# Patient Record
Sex: Male | Born: 1990 | Hispanic: Yes | Marital: Single | Smoking: Former smoker
Health system: Southern US, Community
[De-identification: ages and names within clinical notes are randomized; demographics above are authoritative.]

## PROBLEM LIST (undated history)

## (undated) DIAGNOSIS — N186 End stage renal disease: Secondary | ICD-10-CM

## (undated) DIAGNOSIS — E1159 Type 2 diabetes mellitus with other circulatory complications: Secondary | ICD-10-CM

## (undated) DIAGNOSIS — F1290 Cannabis use, unspecified, uncomplicated: Secondary | ICD-10-CM

## (undated) DIAGNOSIS — E119 Type 2 diabetes mellitus without complications: Secondary | ICD-10-CM

## (undated) DIAGNOSIS — D631 Anemia in chronic kidney disease: Secondary | ICD-10-CM

## (undated) DIAGNOSIS — F4323 Adjustment disorder with mixed anxiety and depressed mood: Secondary | ICD-10-CM

## (undated) DIAGNOSIS — Z992 Dependence on renal dialysis: Secondary | ICD-10-CM

## (undated) DIAGNOSIS — Z794 Long term (current) use of insulin: Secondary | ICD-10-CM

## (undated) DIAGNOSIS — I152 Hypertension secondary to endocrine disorders: Secondary | ICD-10-CM

## (undated) DIAGNOSIS — E785 Hyperlipidemia, unspecified: Secondary | ICD-10-CM

## (undated) DIAGNOSIS — J45909 Unspecified asthma, uncomplicated: Secondary | ICD-10-CM

## (undated) DIAGNOSIS — E1110 Type 2 diabetes mellitus with ketoacidosis without coma: Secondary | ICD-10-CM

## (undated) DIAGNOSIS — N189 Chronic kidney disease, unspecified: Secondary | ICD-10-CM

## (undated) DIAGNOSIS — G4733 Obstructive sleep apnea (adult) (pediatric): Secondary | ICD-10-CM

## (undated) HISTORY — PX: TONSILLECTOMY: SUR1361

## (undated) HISTORY — PX: EYE SURGERY: SHX253

---
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
## 2018-02-12 ENCOUNTER — Ambulatory Visit: Payer: Self-pay

## 2018-02-19 ENCOUNTER — Other Ambulatory Visit: Payer: Self-pay

## 2018-02-19 ENCOUNTER — Emergency Department
Admission: EM | Admit: 2018-02-19 | Discharge: 2018-02-20 | Disposition: A | Discharge status: Home / Self Care | Payer: Self-pay | Attending: Emergency Medicine | Admitting: Emergency Medicine

## 2018-02-19 ENCOUNTER — Encounter: Payer: Self-pay | Admitting: *Deleted

## 2018-02-19 DIAGNOSIS — F1210 Cannabis abuse, uncomplicated: Secondary | ICD-10-CM

## 2018-02-19 DIAGNOSIS — F17210 Nicotine dependence, cigarettes, uncomplicated: Secondary | ICD-10-CM | POA: Insufficient documentation

## 2018-02-19 DIAGNOSIS — F322 Major depressive disorder, single episode, severe without psychotic features: Secondary | ICD-10-CM | POA: Insufficient documentation

## 2018-02-19 DIAGNOSIS — Y92039 Unspecified place in apartment as the place of occurrence of the external cause: Secondary | ICD-10-CM | POA: Insufficient documentation

## 2018-02-19 DIAGNOSIS — T39392A Poisoning by other nonsteroidal anti-inflammatory drugs [NSAID], intentional self-harm, initial encounter: Secondary | ICD-10-CM | POA: Insufficient documentation

## 2018-02-19 DIAGNOSIS — Y939 Activity, unspecified: Secondary | ICD-10-CM | POA: Insufficient documentation

## 2018-02-19 DIAGNOSIS — F4325 Adjustment disorder with mixed disturbance of emotions and conduct: Secondary | ICD-10-CM

## 2018-02-19 DIAGNOSIS — T39311A Poisoning by propionic acid derivatives, accidental (unintentional), initial encounter: Secondary | ICD-10-CM

## 2018-02-19 DIAGNOSIS — X58XXXA Exposure to other specified factors, initial encounter: Secondary | ICD-10-CM | POA: Insufficient documentation

## 2018-02-19 DIAGNOSIS — Y999 Unspecified external cause status: Secondary | ICD-10-CM | POA: Insufficient documentation

## 2018-02-19 DIAGNOSIS — E119 Type 2 diabetes mellitus without complications: Secondary | ICD-10-CM

## 2018-02-19 DIAGNOSIS — T1491XA Suicide attempt, initial encounter: Secondary | ICD-10-CM | POA: Insufficient documentation

## 2018-02-19 DIAGNOSIS — X838XXA Intentional self-harm by other specified means, initial encounter: Secondary | ICD-10-CM | POA: Insufficient documentation

## 2018-02-19 LAB — COMPREHENSIVE METABOLIC PANEL
ALT: 17 U/L (ref 0–44)
AST: 18 U/L (ref 15–41)
Albumin: 4.1 g/dL (ref 3.5–5.0)
Alkaline Phosphatase: 55 U/L (ref 38–126)
Anion gap: 9 (ref 5–15)
BUN: 11 mg/dL (ref 6–20)
CHLORIDE: 106 mmol/L (ref 98–111)
CO2: 27 mmol/L (ref 22–32)
CREATININE: 0.82 mg/dL (ref 0.61–1.24)
Calcium: 9 mg/dL (ref 8.9–10.3)
GFR calc Af Amer: >60 mL/min (ref >60)
GFR calc non Af Amer: >60 mL/min (ref >60)
Glucose, Bld: 286 mg/dL — ABNORMAL HIGH (ref 70–99)
Potassium: 3.6 mmol/L (ref 3.5–5.1)
SODIUM: 142 mmol/L (ref 135–145)
Total Bilirubin: 0.9 mg/dL (ref 0.3–1.2)
Total Protein: 6.5 g/dL (ref 6.5–8.1)

## 2018-02-19 LAB — CBC
HCT: 45.3 % (ref 40.0–52.0)
HEMOGLOBIN: 15.7 g/dL (ref 13.0–18.0)
MCH: 30.2 pg (ref 26.0–34.0)
MCHC: 34.7 g/dL (ref 32.0–36.0)
MCV: 86.8 fL (ref 80.0–100.0)
Platelets: 206 10*3/uL (ref 150–440)
RBC: 5.22 MIL/uL (ref 4.40–5.90)
RDW: 13.3 % (ref 11.5–14.5)
WBC: 11.8 10*3/uL — AB (ref 3.8–10.6)

## 2018-02-19 LAB — URINE DRUG SCREEN, QUALITATIVE (ARMC ONLY)
AMPHETAMINES, UR SCREEN: NOT DETECTED
Barbiturates, Ur Screen: NOT DETECTED
Benzodiazepine, Ur Scrn: NOT DETECTED
CANNABINOID 50 NG, UR ~~LOC~~: POSITIVE — AB
COCAINE METABOLITE, UR ~~LOC~~: NOT DETECTED
MDMA (ECSTASY) UR SCREEN: NOT DETECTED
Methadone Scn, Ur: NOT DETECTED
Opiate, Ur Screen: NOT DETECTED
Phencyclidine (PCP) Ur S: NOT DETECTED
TRICYCLIC, UR SCREEN: NOT DETECTED

## 2018-02-19 LAB — GLUCOSE, CAPILLARY: Glucose-Capillary: 234 mg/dL — ABNORMAL HIGH (ref 70–99)

## 2018-02-19 LAB — ACETAMINOPHEN LEVEL: Acetaminophen (Tylenol), Serum: <10 ug/mL — ABNORMAL LOW (ref 10–30)

## 2018-02-19 LAB — SALICYLATE LEVEL

## 2018-02-19 LAB — ETHANOL: Alcohol, Ethyl (B): <10 mg/dL (ref <10)

## 2018-02-19 MED ORDER — INSULIN ASPART 100 UNIT/ML ~~LOC~~ SOLN
0.0000 [IU] | Freq: Three times a day (TID) | SUBCUTANEOUS | Status: DC
  Administered 2018-02-20: 3 [IU] via SUBCUTANEOUS
  Filled 2018-02-19: qty 1

## 2018-02-19 MED ORDER — INSULIN ASPART 100 UNIT/ML ~~LOC~~ SOLN
0.0000 [IU] | Freq: Every day | SUBCUTANEOUS | Status: DC
  Administered 2018-02-19: 2 [IU] via SUBCUTANEOUS
  Filled 2018-02-19: qty 1

## 2018-02-19 NOTE — ED Provider Notes (Signed)
[facility] Emergency Department Provider Note  ____________________________________________  Time seen: Approximately 8:12 PM  I have reviewed the triage vital signs and the nursing notes.   HISTORY  Chief Complaint Behavior Problem   HPI Zhyon Antenucci is a 27 y.o. male no significant past medical history who presents for evaluation of a suicide attempt.  Patient reports that his girlfriend broke up with him 2 weeks ago and he has been extremely depressed.  Today he posted some messages on Facebook saying that he was tired of living.  He was then found in his apartment and locked in after taking 10-15 naproxen 250mg . The ingestion took place at 3:30PM. No other co-ingestions.  Patient denies any prior history of depression although endorses feeling sad several times in the past.  Denies any prior history of suicide thoughts or attempt.  He reports occasional alcohol drinking with the last drink yesterday and marijuana but denies any other drug use.  He denies any other medical problems.  PMH None - reviewed  Allergies Patient has no known allergies.  No family history on file.  Social History Social History   Tobacco Use  . Smoking status: Current Every Day Smoker  . Smokeless tobacco: Never Used  Substance Use Topics  . Alcohol use: Yes  . Drug use: Yes    Review of Systems  Constitutional: Negative for fever. Eyes: Negative for visual changes. ENT: Negative for sore throat. Neck: No neck pain  Cardiovascular: Negative for chest pain. Respiratory: Negative for shortness of breath. Gastrointestinal: Negative for abdominal pain, vomiting or diarrhea. Genitourinary: Negative for dysuria. Musculoskeletal: Negative for back pain. Skin: Negative for rash. Neurological: Negative for headaches, weakness or numbness. Psych: + depression and SI. No HI  ____________________________________________   PHYSICAL EXAM:  VITAL SIGNS: ED Triage  Vitals  Enc Vitals Group     BP 02/19/18 1905 131/87     Pulse Rate 02/19/18 1905 (!) 130     Resp 02/19/18 1905 20     Temp 02/19/18 1905 98.5 F (36.9 C)     Temp src --      SpO2 02/19/18 1905 99 %     Weight 02/19/18 1906 165 lb (74.8 kg)     Height 02/19/18 1906 5\' 11"  (1.803 m)     Head Circumference --      Peak Flow --      Pain Score 02/19/18 1906 0     Pain Loc --      Pain Edu? --      Excl. in [place]? --     Constitutional: Alert and oriented. Well appearing and in no apparent distress. HEENT:      Head: Normocephalic and atraumatic.         Eyes: Conjunctivae are normal. Sclera is non-icteric.       Mouth/Throat: Mucous membranes are moist.       Neck: Supple with no signs of meningismus. Cardiovascular: Regular rate and rhythm. No murmurs, gallops, or rubs. 2+ symmetrical distal pulses are present in all extremities. No JVD. Respiratory: Normal respiratory effort. Lungs are clear to auscultation bilaterally. No wheezes, crackles, or rhonchi.  Gastrointestinal: Soft, non tender, and non distended with positive bowel sounds. No rebound or guarding. Musculoskeletal: Nontender with normal range of motion in all extremities. No edema, cyanosis, or erythema of extremities. Neurologic: Normal speech and language. Face is symmetric. Moving all extremities. No gross focal neurologic deficits are appreciated. Skin: Skin is warm, dry and intact. No  rash noted. Psychiatric: Mood and affect are normal. Speech and behavior are normal.  ____________________________________________   LABS (all labs ordered are listed, but only abnormal results are displayed)  Labs Reviewed  COMPREHENSIVE METABOLIC PANEL - Abnormal; Notable for the following components:      Result Value   Glucose, Bld 286 (*)    All other components within normal limits  ACETAMINOPHEN LEVEL - Abnormal; Notable for the following components:   Acetaminophen (Tylenol), Serum <10 (*)    All other components within  normal limits  CBC - Abnormal; Notable for the following components:   WBC 11.8 (*)    All other components within normal limits  URINE DRUG SCREEN, QUALITATIVE ([facility] ONLY) - Abnormal; Notable for the following components:   Cannabinoid 50 Ng, Ur [place] POSITIVE (*)    All other components within normal limits  ETHANOL  SALICYLATE LEVEL   ____________________________________________  EKG  ED ECG REPORT I, Rudene Re, the attending physician, personally viewed and interpreted this ECG.  Normal sinus rhythm, rate of 76, normal intervals, normal axis, no ST elevations or depressions.  Normal EKG. ____________________________________________  RADIOLOGY  none ____________________________________________   PROCEDURES  Procedure(s) performed: None Procedures Critical Care performed:  None ____________________________________________   INITIAL IMPRESSION / ASSESSMENT AND PLAN / ED COURSE   27 y.o. male no significant past medical history who presents for evaluation of a suicide attempt after ingesting 10-15 x 250 mg naprosyn at 3:30PM.  Patient is well-appearing in no distress, tachycardic with a pulse of 130 but remainder of his vital signs are within normal limits.  EKG shows normal intervals and no evidence of dysrhythmia. Labs including salicylate and Tylenol levels are negative, normal electrolytes, negative alcohol level and drug screen positive for cannabinoids only.  Discussed with [place] who medically cleared patient at this time.  Will consult psychiatry.  Patient has been IVC      As part of my medical decision making, I reviewed the following data within the [place] notes reviewed and incorporated, Labs reviewed , EKG interpreted , A consult was requested and obtained from this/these consultant(s) [facility] and psychiatry, Notes from prior ED visits and [place] Controlled Substance Database    Pertinent labs &  imaging results that were available during my care of the patient were reviewed by me and considered in my medical decision making (see chart for details).    ____________________________________________   FINAL CLINICAL IMPRESSION(S) / ED DIAGNOSES  Final diagnoses:  Current severe episode of major depressive disorder without psychotic features without prior episode ([place])  Suicide attempt ([place])      NEW MEDICATIONS STARTED DURING THIS VISIT:  ED Discharge Orders    None       Note:  This document was prepared using Dragon voice recognition software and may include unintentional dictation errors.    Alfred Levins, [place], MD 02/19/18 2030

## 2018-02-19 NOTE — ED Notes (Signed)
Pt reports depressed and recent break up with his girlfriend. Tearful during assessment.

## 2018-02-19 NOTE — ED Triage Notes (Signed)
Pt brought in by bpd.  Pt is IVC.  Pt states he is SI.  Pt denies HI. Pt tearful and agitated in triage.

## 2018-02-19 NOTE — ED Notes (Signed)
Pt amb to triage 1 with officer.  Dressed out in [employer] and procedure explained to pt and he verbalizes understanding.  Pts belongings, which include flip flops, red shorts, t shirt and underwear were bagged, labelled and placed at nurses station.  Pts cell phone is in pts red shorts pocket in bag.  Pt denies cash or anything of value that needs to be locked up with security.  Pt amb to [facility] without incident

## 2018-02-20 DIAGNOSIS — T39311A Poisoning by propionic acid derivatives, accidental (unintentional), initial encounter: Secondary | ICD-10-CM

## 2018-02-20 DIAGNOSIS — E119 Type 2 diabetes mellitus without complications: Secondary | ICD-10-CM

## 2018-02-20 DIAGNOSIS — F4325 Adjustment disorder with mixed disturbance of emotions and conduct: Secondary | ICD-10-CM

## 2018-02-20 DIAGNOSIS — F1210 Cannabis abuse, uncomplicated: Secondary | ICD-10-CM

## 2018-02-20 LAB — GLUCOSE, CAPILLARY: GLUCOSE-CAPILLARY: 222 mg/dL — AB (ref 70–99)

## 2018-02-20 NOTE — ED Notes (Signed)
Patient is alert and oriented, states that he wants to go back to sleep, nurse encouraged him to eat His breakfast, and he states that he would soon, patient is safe, will continue to monitor.

## 2018-02-20 NOTE — ED Notes (Signed)
Patient with discharge orders, patient voices understanding of discharge instructions, no signs of distress, all belongings given back to Patient.

## 2018-02-20 NOTE — Consult Note (Signed)
[facility] Psychiatry Consult   Reason for Consult: Consult for 27 year old man who came into the emergency room yesterday evening after taking an overdose Referring Physician: Corky Downs Patient Identification: Darrell Walls MRN:  [id number] Principal Diagnosis: Adjustment disorder with mixed disturbance of emotions and conduct Diagnosis:   Patient Active Problem List   Diagnosis Date Noted  . Adjustment disorder with mixed disturbance of emotions and conduct [F43.25] 02/20/2018  . Diabetes ([place]) [E11.9] 02/20/2018  . Cannabis use disorder, mild, abuse [F12.10] 02/20/2018  . Ibuprofen overdose [T39.311A] 02/20/2018    Total Time spent with patient: 1 hour  Subjective:   Darrell Walls is a 27 y.o. male patient admitted with "I got sad and did something stupid".  HPI: Patient seen chart reviewed.  27 year old man came to the emergency room yesterday afternoon.  At the time that he came in he was described as being tearful.  He tells me today that he was upset yesterday because he discovered that his ex-girlfriend, with whom he had just broken up a couple weeks ago, was now dating his best friend.  He says that he could deal with the breakup okay but that finding out she was with his friend felt like of the Trail.  He got really upset yesterday afternoon and impulsively swallowed what he claims now were about 5 ibuprofen tablets.  He did it in plain sight of his mother who then called an ambulance and had him brought to the hospital.  Patient says that prior to yesterday he had been feeling a little bit down and nervous but not depressed all the time.  Generally felt positive and upbeat.  He focuses mostly he says on trying to get his life organized down here.  He only recently moved to the area and he needs to get his insurance set up try to get a job try to get a primary care doctor.  Patient says he drinks very rarely but was not drinking yesterday.  Admits to regular marijuana use about 4 times  a week.  Denies other drug use.  Not currently getting any outpatient mental health treatment.  Social history: Patient says he only recently moved down here to [place].  He is trying to find a job and get some insurance instated so that he can keep up his medical care.  He is living with his mother.  Medical history: Patient has juvenile onset diabetes insulin-dependent.  He is currently out of insulin and says he is been going without for about 3 weeks.  Blood sugars look like they are ranging in the 200s currently.  Substance abuse history: Denies that he is ever felt like he had a substance abuse problem.  Says he drinks only occasionally and does not see the marijuana necessarily as an issue.  Drug screen is only positive for cannabis  Past Psychiatric History: Patient denies ever seeing a psychiatrist therapist or mental health provider in the past.  Never been on any medication for depression or anxiety.  Never tried to kill himself before.  Never been in a psychiatric hospital.  Does not sound like he is ever had a clear-cut depression or mania and no history of any psychosis  Risk to Self:   Risk to Others:   Prior Inpatient Therapy:   Prior Outpatient Therapy:    Past Medical History: No past medical history on file.  Family History: No family history on file. Family Psychiatric  History: Patient says his mother has had depression and had  a suicide attempt in the past and that his grandmother had chronic mental health problems as well Social History:  Social History   Substance and Sexual Activity  Alcohol Use Yes     Social History   Substance and Sexual Activity  Drug Use Yes    Social History   Socioeconomic History  . Marital status: Single    Spouse name: Not on file  . Number of children: Not on file  . Years of education: Not on file  . Highest education level: Not on file  Occupational History  . Not on file  Social Needs  . Financial resource strain:  Not on file  . Food insecurity:    Worry: Not on file    Inability: Not on file  . Transportation needs:    Medical: Not on file    Non-medical: Not on file  Tobacco Use  . Smoking status: Current Every Day Smoker  . Smokeless tobacco: Never Used  Substance and Sexual Activity  . Alcohol use: Yes  . Drug use: Yes  . Sexual activity: Not on file  Lifestyle  . Physical activity:    Days per week: Not on file    Minutes per session: Not on file  . Stress: Not on file  Relationships  . Social connections:    Talks on phone: Not on file    Gets together: Not on file    Attends religious service: Not on file    Active member of club or organization: Not on file    Attends meetings of clubs or organizations: Not on file    Relationship status: Not on file  Other Topics Concern  . Not on file  Social History Narrative  . Not on file   Additional Social History:    Allergies:  No Known Allergies  Labs:  Results for orders placed or performed during the hospital encounter of 02/19/18 (from the past 48 hour(s))  Comprehensive metabolic panel     Status: Abnormal   Collection Time: 02/19/18  7:08 PM  Result Value Ref Range   Sodium 142 135 - 145 mmol/L   Potassium 3.6 3.5 - 5.1 mmol/L   Chloride 106 98 - 111 mmol/L   CO2 27 22 - 32 mmol/L   Glucose, Bld 286 (H) 70 - 99 mg/dL   BUN 11 6 - 20 mg/dL   Creatinine, Ser 0.82 0.61 - 1.24 mg/dL   Calcium 9.0 8.9 - 10.3 mg/dL   Total Protein 6.5 6.5 - 8.1 g/dL   Albumin 4.1 3.5 - 5.0 g/dL   AST 18 15 - 41 U/L   ALT 17 0 - 44 U/L   Alkaline Phosphatase 55 38 - 126 U/L   Total Bilirubin 0.9 0.3 - 1.2 mg/dL   GFR calc non Af Amer >60 >60 mL/min   GFR calc Af Amer >60 >60 mL/min    Comment: (NOTE) The eGFR has been calculated using the CKD EPI equation. This calculation has not been validated in all clinical situations. eGFR's persistently <60 mL/min signify possible Chronic Kidney Disease.    Anion gap 9 5 - 15    Comment:  Performed at [facility], [address] [place], [place] [address]  Ethanol     Status: None   Collection Time: 02/19/18  7:08 PM  Result Value Ref Range   Alcohol, Ethyl (B) <10 <10 mg/dL    Comment: (NOTE) Lowest detectable limit for serum alcohol is 10 mg/dL. For medical purposes  only. Performed at [facility], [address] [place], [place] [address]   Salicylate level     Status: None   Collection Time: 02/19/18  7:08 PM  Result Value Ref Range   Salicylate Lvl <2.4 2.8 - 30.0 mg/dL    Comment: Performed at [facility], [address] [place], [place] [address]  Acetaminophen level     Status: Abnormal   Collection Time: 02/19/18  7:08 PM  Result Value Ref Range   Acetaminophen (Tylenol), Serum <10 (L) 10 - 30 ug/mL    Comment: (NOTE) Therapeutic concentrations vary significantly. A range of 10-30 ug/mL  may be an effective concentration for many patients. However, some  are best treated at concentrations outside of this range. Acetaminophen concentrations >150 ug/mL at 4 hours after ingestion  and >50 ug/mL at 12 hours after ingestion are often associated with  toxic reactions. Performed at [facility], [address] [place], [place] [address]   cbc     Status: Abnormal   Collection Time: 02/19/18  7:08 PM  Result Value Ref Range   WBC 11.8 (H) 3.8 - 10.6 K/uL   RBC 5.22 4.40 - 5.90 MIL/uL   Hemoglobin 15.7 13.0 - 18.0 g/dL   HCT 45.3 40.0 - 52.0 %   MCV 86.8 80.0 - 100.0 fL   MCH 30.2 26.0 - 34.0 pg   MCHC 34.7 32.0 - 36.0 g/dL   RDW 13.3 11.5 - 14.5 %   Platelets 206 150 - 440 K/uL    Comment: Performed at [facility], [address], [place], [place] [address]  Urine Drug Screen, Qualitative     Status: Abnormal   Collection Time: 02/19/18  7:08 PM  Result Value Ref Range   Tricyclic, Ur Screen NONE DETECTED NONE DETECTED   Amphetamines, Ur Screen NONE DETECTED NONE DETECTED   MDMA (Ecstasy)Ur  Screen NONE DETECTED NONE DETECTED   Cocaine Metabolite,Ur [place] NONE DETECTED NONE DETECTED   Opiate, Ur Screen NONE DETECTED NONE DETECTED   Phencyclidine (PCP) Ur S NONE DETECTED NONE DETECTED   Cannabinoid 50 Ng, Ur [place] POSITIVE (A) NONE DETECTED   Barbiturates, Ur Screen NONE DETECTED NONE DETECTED   Benzodiazepine, Ur Scrn NONE DETECTED NONE DETECTED   Methadone Scn, Ur NONE DETECTED NONE DETECTED    Comment: (NOTE) Tricyclics + metabolites, urine    Cutoff 1000 ng/mL Amphetamines + metabolites, urine  Cutoff 1000 ng/mL MDMA (Ecstasy), urine              Cutoff 500 ng/mL Cocaine Metabolite, urine          Cutoff 300 ng/mL Opiate + metabolites, urine        Cutoff 300 ng/mL Phencyclidine (PCP), urine         Cutoff 25 ng/mL Cannabinoid, urine                 Cutoff 50 ng/mL Barbiturates + metabolites, urine  Cutoff 200 ng/mL Benzodiazepine, urine              Cutoff 200 ng/mL Methadone, urine                   Cutoff 300 ng/mL The urine drug screen provides only a preliminary, unconfirmed analytical test result and should not be used for non-medical purposes. Clinical consideration and professional judgment should be applied to any positive drug screen result due to possible interfering substances. A more specific alternate chemical method must be used in order  to obtain a confirmed analytical result. Gas chromatography / mass spectrometry (GC/MS) is the preferred confirmat ory method. Performed at [facility], [address] [place], [place] [address]   Glucose, capillary     Status: Abnormal   Collection Time: 02/19/18  9:30 PM  Result Value Ref Range   Glucose-Capillary 234 (H) 70 - 99 mg/dL  Glucose, capillary     Status: Abnormal   Collection Time: 02/20/18  6:49 AM  Result Value Ref Range   Glucose-Capillary 222 (H) 70 - 99 mg/dL    Current Facility-Administered Medications  Medication Dose Route Frequency Provider Last Rate Last Dose  . insulin aspart  (novoLOG) injection 0-5 Units  0-5 Units Subcutaneous QHS Rudene Re, MD   2 Units at 02/19/18 2135  . insulin aspart (novoLOG) injection 0-9 Units  0-9 Units Subcutaneous TID WC Rudene Re, MD   3 Units at 02/20/18 0757   No current outpatient medications on file.    Musculoskeletal: Strength & Muscle Tone: within normal limits Gait & Station: normal Patient leans: N/A  Psychiatric Specialty Exam: Physical Exam  Nursing note and vitals reviewed. Constitutional: He appears well-developed and well-nourished.  HENT:  Head: Normocephalic and atraumatic.  Eyes: Pupils are equal, round, and reactive to light. Conjunctivae are normal.  Neck: Normal range of motion.  Cardiovascular: Regular rhythm and normal heart sounds.  Respiratory: Effort normal. No respiratory distress.  GI: Soft.  Musculoskeletal: Normal range of motion.  Neurological: He is alert.  Skin: Skin is warm and dry.  Psychiatric: He has a normal mood and affect. His speech is normal and behavior is normal. Judgment and thought content normal. Cognition and memory are normal.    Review of Systems  Constitutional: Negative.   HENT: Negative.   Eyes: Negative.   Respiratory: Negative.   Cardiovascular: Negative.   Gastrointestinal: Negative.   Musculoskeletal: Negative.   Skin: Negative.   Neurological: Negative.   Psychiatric/Behavioral: Negative for depression, hallucinations, memory loss, substance abuse and suicidal ideas. The patient is not nervous/anxious and does not have insomnia.     Blood pressure 131/87, pulse 73, temperature 98.2 F (36.8 C), temperature source Oral, resp. rate 20, height '5\' 11"'$  (1.803 m), weight 74.8 kg, SpO2 100 %.Body mass index is 23.01 kg/m.  General Appearance: Casual  Eye Contact:  Good  Speech:  Clear and Coherent  Volume:  Normal  Mood:  Euthymic  Affect:  Congruent  Thought Process:  Goal Directed  Orientation:  Full (Time, Place, and Person)  Thought  Content:  Logical  Suicidal Thoughts:  No  Homicidal Thoughts:  No  Memory:  Immediate;   Fair Recent;   Fair Remote;   Fair  Judgement:  Fair  Insight:  Fair  Psychomotor Activity:  Normal  Concentration:  Concentration: Fair  Recall:  [employer] of Knowledge:  Fair  Language:  Fair  Akathisia:  No  Handed:  Right  AIMS (if indicated):     Assets:  Desire for Improvement Housing Resilience Social Support  ADL's:  Intact  Cognition:  WNL  Sleep:        Treatment Plan Summary: Plan 27 year old man came in yesterday after an impulsive overdose but it was done with an obviously sublethal amount of medicine and done right in front of his family.  Although he was upset when he came in all day today he has consistently been denying any suicidal thoughts.  Affect and mood seemed genuinely embarrassed but upbeat.  Patient  is very focused on positive things in the future.  I do not think at this point he requires inpatient hospitalization or meets involuntary commitment criteria any longer.  Discontinue IVC.  Supportive counseling and psychoeducation.  He will be given information about [facility] if he feels like seeing someone for therapy would be helpful.  Otherwise case reviewed with emergency room doctor and TTS and patient can be discharged home.  Disposition: No evidence of imminent risk to self or others at present.   Patient does not meet criteria for psychiatric inpatient admission. Supportive therapy provided about ongoing stressors. Discussed crisis plan, support from social network, calling 911, coming to the Emergency Department, and calling Suicide Hotline.  Alethia Berthold, MD 02/20/2018 11:28 AM

## 2018-02-20 NOTE — ED Notes (Signed)
Dr. Weber Cooks in talking with patient, Patient is calm and cooperative.

## 2018-02-20 NOTE — ED Provider Notes (Signed)
-----------------------------------------   6:42 AM on 02/20/2018 -----------------------------------------   Blood pressure 131/87, pulse (!) 130, temperature 98.5 F (36.9 C), resp. rate 20, height 5\' 11"  (1.803 m), weight 74.8 kg, SpO2 99 %.  The patient had no acute events since last update.  Calm and cooperative at this time.  Disposition is pending Psychiatry/Behavioral Medicine team recommendations.     Paulette Blanch, MD 02/20/18 [phone]

## 2018-05-11 ENCOUNTER — Emergency Department: Payer: Medicaid Other

## 2018-05-11 ENCOUNTER — Encounter: Payer: Self-pay | Admitting: Emergency Medicine

## 2018-05-11 ENCOUNTER — Emergency Department
Admission: EM | Admit: 2018-05-11 | Discharge: 2018-05-11 | Disposition: A | Discharge status: Home / Self Care | Payer: Medicaid Other | Attending: Emergency Medicine | Admitting: Emergency Medicine

## 2018-05-11 ENCOUNTER — Other Ambulatory Visit: Payer: Self-pay

## 2018-05-11 DIAGNOSIS — E119 Type 2 diabetes mellitus without complications: Secondary | ICD-10-CM | POA: Diagnosis not present

## 2018-05-11 DIAGNOSIS — F17210 Nicotine dependence, cigarettes, uncomplicated: Secondary | ICD-10-CM | POA: Insufficient documentation

## 2018-05-11 DIAGNOSIS — H547 Unspecified visual loss: Secondary | ICD-10-CM | POA: Insufficient documentation

## 2018-05-11 DIAGNOSIS — H538 Other visual disturbances: Secondary | ICD-10-CM | POA: Diagnosis present

## 2018-05-11 DIAGNOSIS — R739 Hyperglycemia, unspecified: Secondary | ICD-10-CM

## 2018-05-11 HISTORY — DX: Type 2 diabetes mellitus without complications: E11.9

## 2018-05-11 LAB — CBC WITH DIFFERENTIAL/PLATELET
Abs Immature Granulocytes: 0.05 10*3/uL (ref 0.00–0.07)
BASOS ABS: 0 10*3/uL (ref 0.0–0.1)
Basophils Relative: 0 %
EOS PCT: 1 %
Eosinophils Absolute: 0.1 10*3/uL (ref 0.0–0.5)
HCT: 44.4 % (ref 39.0–52.0)
Hemoglobin: 15.3 g/dL (ref 13.0–17.0)
Immature Granulocytes: 1 %
LYMPHS PCT: 26 %
Lymphs Abs: 2.2 10*3/uL (ref 0.7–4.0)
MCH: 30.4 pg (ref 26.0–34.0)
MCHC: 34.5 g/dL (ref 30.0–36.0)
MCV: 88.1 fL (ref 80.0–100.0)
Monocytes Absolute: 0.5 10*3/uL (ref 0.1–1.0)
Monocytes Relative: 6 %
NRBC: 0 % (ref 0.0–0.2)
Neutro Abs: 5.5 10*3/uL (ref 1.7–7.7)
Neutrophils Relative %: 66 %
PLATELETS: 207 10*3/uL (ref 150–400)
RBC: 5.04 MIL/uL (ref 4.22–5.81)
RDW: 11.9 % (ref 11.5–15.5)
WBC: 8.5 10*3/uL (ref 4.0–10.5)

## 2018-05-11 LAB — COMPREHENSIVE METABOLIC PANEL
ALT: 18 U/L (ref 0–44)
AST: 15 U/L (ref 15–41)
Albumin: 3.7 g/dL (ref 3.5–5.0)
Alkaline Phosphatase: 66 U/L (ref 38–126)
Anion gap: 7 (ref 5–15)
BUN: 10 mg/dL (ref 6–20)
CALCIUM: 8.5 mg/dL — AB (ref 8.9–10.3)
CO2: 25 mmol/L (ref 22–32)
Chloride: 104 mmol/L (ref 98–111)
Creatinine, Ser: 0.68 mg/dL (ref 0.61–1.24)
GFR calc non Af Amer: >60 mL/min (ref >60)
Glucose, Bld: 295 mg/dL — ABNORMAL HIGH (ref 70–99)
Potassium: 4.2 mmol/L (ref 3.5–5.1)
Sodium: 136 mmol/L (ref 135–145)
Total Bilirubin: 0.7 mg/dL (ref 0.3–1.2)
Total Protein: 5.9 g/dL — ABNORMAL LOW (ref 6.5–8.1)

## 2018-05-11 LAB — GLUCOSE, CAPILLARY: GLUCOSE-CAPILLARY: 276 mg/dL — AB (ref 70–99)

## 2018-05-11 LAB — APTT: aPTT: <24 seconds — ABNORMAL LOW (ref 24–36)

## 2018-05-11 MED ORDER — INSULIN GLARGINE 100 UNIT/ML ~~LOC~~ SOLN: SUBCUTANEOUS | 0 refills | Status: DC

## 2018-05-11 NOTE — ED Triage Notes (Signed)
Pt reports that he moved here from [place] and has not been able to get his medication for diabetes since September. His blood sugar today was 279. He has blurred vision in his left eye.

## 2018-05-11 NOTE — ED Notes (Signed)
Pt states he recently moved here from [place], pt states he has been out of his insulin and today has blurred vision with a "bunch of floaters" in  It,, states he cant really see anything out of that left eye. Denies any other issues. Given pt resources for medications, pt awaiting EDP .

## 2018-05-11 NOTE — ED Notes (Signed)
Pt ambulatory to POV without difficulty. VSS. NAD. Discharge instructions, RX and follow up reviewed. All questions and concerns addressed.  

## 2018-05-11 NOTE — ED Provider Notes (Signed)
[facility] Emergency Department Provider Note  Time seen: 6:51 PM  I have reviewed the triage vital signs and the nursing notes.   HISTORY  Chief Complaint Loss of Vision    HPI Darrell Walls is a 27 y.o. male with a past medical history of diabetes who presents to the emergency department for blurred vision in the left eye.  According to the patient he has been off of his insulin since September when he moved to [place].  He is currently waiting for his insurance to kick in, but states it will be very shortly.  Denies any recent abdominal pain nausea vomiting.  Patient states today he noticed floaters and a blurred vision to his left eye.  States a similar thing happened previously when he lived in [place] and required injections in both of his eyes.  Patient was concerned so he came to the ER for evaluation.   Past Medical History:  Diagnosis Date  . Diabetes mellitus without complication [facility])     Patient Active Problem List   Diagnosis Date Noted  . 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 Diabetes ([place]) 02/20/2018  . Cannabis use disorder, mild, abuse 02/20/2018  . Ibuprofen overdose 02/20/2018     Prior to Admission medications   Not on File    No Known Allergies  History reviewed. No pertinent family history.  Social History Social History   Tobacco Use  . Smoking status: Current Every Day Smoker  . Smokeless tobacco: Never Used  Substance Use Topics  . Alcohol use: Yes  . Drug use: Yes    Review of Systems Constitutional: Negative for fever. Eyes: Blurred vision with floaters in left eye. ENT: Negative for recent illness/congestion Cardiovascular: Negative for chest pain. Respiratory: Negative for shortness of breath. Gastrointestinal: Negative for abdominal pain, vomiting Musculoskeletal: Negative for musculoskeletal complaints Skin: Negative for skin complaints  Neurological:  Negative for headache All other ROS negative  ____________________________________________   PHYSICAL EXAM:  VITAL SIGNS: ED Triage Vitals  Enc Vitals Group     BP 05/11/18 1705 (!) 151/101     Pulse Rate 05/11/18 1705 93     Resp 05/11/18 1705 18     Temp 05/11/18 1705 98.7 F (37.1 C)     Temp Source 05/11/18 1705 Oral     SpO2 05/11/18 1705 99 %     Weight 05/11/18 1706 160 lb (72.6 kg)     Height 05/11/18 1706 5\' 11"  (1.803 m)     Head Circumference --      Peak Flow --      Pain Score 05/11/18 1706 0     Pain Loc --      Pain Edu? --      Excl. in [place]? --    Constitutional: Alert and oriented. Well appearing and in no distress. Eyes: Normal exam, EOMI, PERRLA. ENT   Head: Normocephalic and atraumatic.   Mouth/Throat: Mucous membranes are moist. Cardiovascular: Normal rate, regular rhythm. No murmur Respiratory: Normal respiratory effort without tachypnea nor retractions. Breath sounds are clear  Gastrointestinal: Soft and nontender. No distention.  Musculoskeletal: Nontender with normal range of motion in all extremities.  Neurologic:  Normal speech and language. No gross focal neurologic deficits Skin:  Skin is warm, dry and intact.  Psychiatric: Mood and affect are normal.   ____________________________________________     RADIOLOGY  CT head negative  ____________________________________________   INITIAL IMPRESSION / ASSESSMENT AND PLAN /  ED COURSE  Pertinent labs & imaging results that were available during my care of the patient were reviewed by me and considered in my medical decision making (see chart for details).  Patient presents to the emergency department with blurred vision in the left eye with floaters.  Has been off of his diabetic medications Lantus and NovoLog for the past 3 months.  Differential would include vitreous hemorrhage, macular edema, less likely retinal detachment.  Patient appears well, labs show mild hyperglycemia  otherwise within normal limits.  No signs of DKA.  We will prescribe insulin for the patient.  I discussed with the patient medication management he will attempt to get his medicines filled through medication management until his insurance kicks in.  I performed a bedside ultrasound, no sign of vitreous hemorrhage in the left eye.  I discussed patient with ophthalmology Dr. George Ina, he will see in the office tomorrow.  Patient agreeable to this plan of care.  ____________________________________________   FINAL CLINICAL IMPRESSION(S) / ED DIAGNOSES  Blurred vision    Harvest Dark, MD 05/11/18 [phone]

## 2018-06-29 HISTORY — PX: PARS PLANA VITRECTOMY W/ ENDOLASER PANRETINAL PHOTOCOAGULATION: SHX7338

## 2018-07-28 ENCOUNTER — Encounter: Payer: Self-pay | Admitting: Emergency Medicine

## 2018-07-28 ENCOUNTER — Ambulatory Visit
Admission: EM | Admit: 2018-07-28 | Discharge: 2018-07-28 | Disposition: A | Discharge status: Home / Self Care | Payer: Medicaid Other | Attending: Family Medicine | Admitting: Family Medicine

## 2018-07-28 ENCOUNTER — Other Ambulatory Visit: Payer: Self-pay

## 2018-07-28 DIAGNOSIS — E11319 Type 2 diabetes mellitus with unspecified diabetic retinopathy without macular edema: Secondary | ICD-10-CM | POA: Diagnosis not present

## 2018-07-28 DIAGNOSIS — E1165 Type 2 diabetes mellitus with hyperglycemia: Secondary | ICD-10-CM | POA: Diagnosis present

## 2018-07-28 DIAGNOSIS — IMO0002 Reserved for concepts with insufficient information to code with codable children: Secondary | ICD-10-CM

## 2018-07-28 LAB — RAPID STREP SCREEN (MED CTR MEBANE ONLY): Streptococcus, Group A Screen (Direct): NEGATIVE

## 2018-07-28 LAB — GLUCOSE, CAPILLARY: GLUCOSE-CAPILLARY: 294 mg/dL — AB (ref 70–99)

## 2018-07-28 MED ORDER — INSULIN SYRINGES (DISPOSABLE) U-100 0.3 ML MISC: 0 refills | Status: DC

## 2018-07-28 MED ORDER — INSULIN GLARGINE 100 UNIT/ML ~~LOC~~ SOLN: 10.0000 [IU] | Freq: Every day | SUBCUTANEOUS | 0 refills | Status: DC

## 2018-07-28 NOTE — Discharge Instructions (Signed)
Follow up with Primary Care provider as scheduled

## 2018-07-28 NOTE — ED Provider Notes (Signed)
[facility]    CSN: [id number] Arrival date & time: 07/28/18  1817     History   Chief Complaint Chief Complaint  Patient presents with  . Medication Refill    HPI Darrell Walls is a 28 y.o. male.   28 yo male with a c/o medication refill. Patient is diabetic and states he just got his [employer] after moving here and has an appointment to establish care with a PCP at the end of this month, however he is out of Lantus. Was seen in ED in December and given rx however patient didn't have insurance then and didn't get it due to cost. C/o slight sore throat today. No other complaints.   The history is provided by the patient.  Medication Refill    Past Medical History:  Diagnosis Date  . Diabetes mellitus without complication [facility])     Patient Active Problem List   Diagnosis Date Noted  . 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 Diabetes ([place]) 02/20/2018  . Cannabis use disorder, mild, abuse 02/20/2018  . Ibuprofen overdose 02/20/2018    Past Surgical History:  Procedure Laterality Date  . EYE SURGERY    . TONSILLECTOMY         Home Medications    Prior to Admission medications   Medication Sig Start Date End Date Taking? Authorizing Provider  insulin glargine (LANTUS) 100 UNIT/ML injection Inject 0.1 mLs (10 Units total) into the skin daily. 07/28/18   Norval Gable, MD  Insulin Syringes, Disposable, U-100 0.3 ML MISC Use as directed 07/28/18   Norval Gable, MD    Family History Family History  Problem Relation Age of Onset  . Healthy Mother     Social History Social History   Tobacco Use  . Smoking status: Current Every Day Smoker    Types: Cigarettes  . Smokeless tobacco: Never Used  Substance Use Topics  . Alcohol use: Yes  . Drug use: Yes    Types: Marijuana     Allergies   Patient has no known allergies.   Review of Systems Review of Systems   Physical Exam Triage Vital  Signs ED Triage Vitals  Enc Vitals Group     BP 07/28/18 1907 126/89     Pulse Rate 07/28/18 1907 92     Resp 07/28/18 1907 16     Temp 07/28/18 1907 98.4 F (36.9 C)     Temp Source 07/28/18 1907 Oral     SpO2 07/28/18 1907 100 %     Weight 07/28/18 1903 160 lb (72.6 kg)     Height 07/28/18 1903 5\' 11"  (1.803 m)     Head Circumference --      Peak Flow --      Pain Score 07/28/18 1903 5     Pain Loc --      Pain Edu? --      Excl. in [place]? --    No data found.  Updated Vital Signs BP 126/89 (BP Location: Left Arm)   Pulse 92   Temp 98.4 F (36.9 C) (Oral)   Resp 16   Ht 5\' 11"  (1.803 m)   Wt 72.6 kg   SpO2 100%   BMI 22.32 kg/m   Visual Acuity Right Eye Distance:   Left Eye Distance:   Bilateral Distance:    Right Eye Near:   Left Eye Near:    Bilateral Near:     Physical Exam Vitals signs and  nursing note reviewed.  Constitutional:      General: He is not in acute distress.    Appearance: He is not toxic-appearing or diaphoretic.  Neurological:     Mental Status: He is alert.      UC Treatments / Results  Labs (all labs ordered are listed, but only abnormal results are displayed) Labs Reviewed  GLUCOSE, CAPILLARY - Abnormal; Notable for the following components:      Result Value   Glucose-Capillary 294 (*)    All other components within normal limits  RAPID STREP SCREEN ([facility] ONLY)  CULTURE, GROUP A STREP ([place])  CBG MONITORING, ED    EKG None  Radiology No results found.  Procedures Procedures (including critical care time)  Medications Ordered in UC Medications - No data to display  Initial Impression / Assessment and Plan / UC Course  I have reviewed the triage vital signs and the nursing notes.  Pertinent labs & imaging results that were available during my care of the patient were reviewed by me and considered in my medical decision making (see chart for details).      Final Clinical Impressions(s) / UC Diagnoses    Final diagnoses:  Type 2 diabetes, uncontrolled, with retinopathy [facility])     Discharge Instructions     Follow up with Primary Care provider as scheduled    ED Prescriptions    Medication Sig Dispense Auth. Provider   insulin glargine (LANTUS) 100 UNIT/ML injection Inject 0.1 mLs (10 Units total) into the skin daily. 10 mL Norval Gable, MD   Insulin Syringes, Disposable, U-100 0.3 ML MISC Use as directed 100 each Riyan Haile, MD     1. diagnosis reviewed with patient 2. rx as per orders above; reviewed possible side effects, interactions, risks and benefits  3. Keep appt to establish care with PCP 4. Follow-up prn if symptoms worsen or don't improve   Controlled Substance Prescriptions [place] Controlled Substance Registry consulted? Not Applicable   Norval Gable, MD 07/28/18 Joen Laura

## 2018-07-28 NOTE — ED Triage Notes (Signed)
Patient out of his Humolog 70/30 insulin and diabetic supplies.  Patient has been out of this since September.   Patient recently moved her from [place].

## 2018-07-31 LAB — CULTURE, GROUP A STREP (THRC)

## 2018-08-10 ENCOUNTER — Emergency Department
Admission: EM | Admit: 2018-08-10 | Discharge: 2018-08-10 | Disposition: A | Discharge status: Home / Self Care | Payer: Medicaid Other | Attending: Emergency Medicine | Admitting: Emergency Medicine

## 2018-08-10 ENCOUNTER — Other Ambulatory Visit: Payer: Self-pay

## 2018-08-10 ENCOUNTER — Encounter: Payer: Self-pay | Admitting: Emergency Medicine

## 2018-08-10 ENCOUNTER — Emergency Department: Payer: Medicaid Other

## 2018-08-10 DIAGNOSIS — J9801 Acute bronchospasm: Secondary | ICD-10-CM

## 2018-08-10 DIAGNOSIS — Z794 Long term (current) use of insulin: Secondary | ICD-10-CM | POA: Diagnosis not present

## 2018-08-10 DIAGNOSIS — F17210 Nicotine dependence, cigarettes, uncomplicated: Secondary | ICD-10-CM | POA: Insufficient documentation

## 2018-08-10 DIAGNOSIS — E119 Type 2 diabetes mellitus without complications: Secondary | ICD-10-CM | POA: Insufficient documentation

## 2018-08-10 DIAGNOSIS — J101 Influenza due to other identified influenza virus with other respiratory manifestations: Secondary | ICD-10-CM | POA: Diagnosis not present

## 2018-08-10 DIAGNOSIS — R509 Fever, unspecified: Secondary | ICD-10-CM | POA: Diagnosis present

## 2018-08-10 LAB — INFLUENZA PANEL BY PCR (TYPE A & B)
INFLBPCR: POSITIVE — AB
Influenza A By PCR: NEGATIVE

## 2018-08-10 MED ORDER — KETOROLAC TROMETHAMINE 60 MG/2ML IM SOLN
60.0000 mg | Freq: Once | INTRAMUSCULAR | Status: AC
  Administered 2018-08-10: 60 mg via INTRAMUSCULAR
  Filled 2018-08-10: qty 2

## 2018-08-10 MED ORDER — IBUPROFEN 600 MG PO TABS: 600.0000 mg | ORAL_TABLET | Freq: Three times a day (TID) | ORAL | 0 refills | Status: DC | PRN

## 2018-08-10 MED ORDER — OSELTAMIVIR PHOSPHATE 75 MG PO CAPS: 75.0000 mg | ORAL_CAPSULE | Freq: Two times a day (BID) | ORAL | 0 refills | Status: AC

## 2018-08-10 MED ORDER — PSEUDOEPH-BROMPHEN-DM 30-2-10 MG/5ML PO SYRP: 5.0000 mL | ORAL_SOLUTION | Freq: Four times a day (QID) | ORAL | 0 refills | Status: DC | PRN

## 2018-08-10 MED ORDER — HYDROCOD POLST-CPM POLST ER 10-8 MG/5ML PO SUER
5.0000 mL | Freq: Once | ORAL | Status: AC
  Administered 2018-08-10: 5 mL via ORAL
  Filled 2018-08-10: qty 5

## 2018-08-10 NOTE — ED Provider Notes (Addendum)
[facility] Emergency Department Provider Note   ____________________________________________   First MD Initiated Contact with Patient 08/10/18 0802     (approximate)  I have reviewed the triage vital signs and the nursing notes.   HISTORY  Chief Complaint Fever; Cough; and Shortness of Breath    HPI Darrell Walls is a 28 y.o. male patient presents with 3 days of body aches, fever, cough, shortness of breath.  Patient denies recent travel contact anyone who had recent travel.  Patient denies nausea, vomiting, or diarrhea.  Patient rates the pain as a 7/10.  Patient described the pain is "achy".  No palliative measure for complaint.  Patient state he believes he is taken flu shot for this season.      Past Medical History:  Diagnosis Date  . Diabetes mellitus without complication [facility])     Patient Active Problem List   Diagnosis Date Noted  . 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 Diabetes ([place]) 02/20/2018  . Cannabis use disorder, mild, abuse 02/20/2018  . Ibuprofen overdose 02/20/2018    Past Surgical History:  Procedure Laterality Date  . EYE SURGERY    . TONSILLECTOMY      Prior to Admission medications   Medication Sig Start Date End Date Taking? Authorizing Provider  brompheniramine-pseudoephedrine-DM 30-2-10 MG/5ML syrup Take 5 mLs by mouth 4 (four) times daily as needed. 08/10/18   Sable Feil, PA-C  ibuprofen (ADVIL,MOTRIN) 600 MG tablet Take 1 tablet (600 mg total) by mouth every 8 (eight) hours as needed. 08/10/18   Sable Feil, PA-C  insulin glargine (LANTUS) 100 UNIT/ML injection Inject 0.1 mLs (10 Units total) into the skin daily. 07/28/18   Norval Gable, MD  Insulin Syringes, Disposable, U-100 0.3 ML MISC Use as directed 07/28/18   Norval Gable, MD  oseltamivir (TAMIFLU) 75 MG capsule Take 1 capsule (75 mg total) by mouth 2 (two) times daily for 5 days. 08/10/18 08/15/18  Sable Feil, PA-C    Allergies Patient has no known allergies.  Family History  Problem Relation Age of Onset  . Healthy Mother     Social History Social History   Tobacco Use  . Smoking status: Current Every Day Smoker    Types: Cigarettes  . Smokeless tobacco: Never Used  Substance Use Topics  . Alcohol use: Yes  . Drug use: Yes    Types: Marijuana    Review of Systems Constitutional: No fever/chills Eyes: No visual changes. ENT: No sore throat. Cardiovascular: Denies chest pain. Respiratory: Denies shortness of breath. Gastrointestinal: No abdominal pain.  No nausea, no vomiting.  No diarrhea.  No constipation. Genitourinary: Negative for dysuria. Musculoskeletal: Negative for back pain. Skin: Negative for rash. Neurological: Negative for headaches, focal weakness or numbness. Psychiatric:  Adjustment disorder and cannabis abuse.  Endocrine:  Diabetes.   ____________________________________________   PHYSICAL EXAM:  VITAL SIGNS: ED Triage Vitals [08/10/18 0739]  Enc Vitals Group     BP 120/76     Pulse Rate (!) 101     Resp 16     Temp 98.3 F (36.8 C)     Temp src      SpO2 99 %     Weight 159 lb 13.3 oz (72.5 kg)     Height 5\' 11"  (1.803 m)     Head Circumference      Peak Flow      Pain Score 7     Pain Loc  Pain Edu?      Excl. in [place]?     Constitutional: Alert and oriented. Well appearing and in no acute distress. Nose: Nasal congestion.   Mouth/Throat: Mucous membranes are moist.  Oropharynx non-erythematous. Neck: No stridor.   Hematological/Lymphatic/Immunilogical: No cervical lymphadenopathy. Cardiovascular: Normal rate, regular rhythm. Grossly normal heart sounds.  Good peripheral circulation. Respiratory: Normal respiratory effort.  No retractions. Lungs CTAB.  Increased cough with deep inspiration. Gastrointestinal: Soft and nontender. No distention. No abdominal bruits. No CVA tenderness. Musculoskeletal: No lower extremity  tenderness nor edema.  No joint effusions. Neurologic:  Normal speech and language. No gross focal neurologic deficits are appreciated. No gait instability. Skin:  Skin is warm, dry and intact. No rash noted. Psychiatric: Mood and affect are normal. Speech and behavior are normal.  ____________________________________________   LABS (all labs ordered are listed, but only abnormal results are displayed)  Labs Reviewed  INFLUENZA PANEL BY PCR (TYPE A & B) - Abnormal; Notable for the following components:      Result Value   Influenza B By PCR POSITIVE (*)    All other components within normal limits   ____________________________________________  EKG   ____________________________________________  RADIOLOGY  ED MD interpretation:    Official radiology report(s): Dg Chest 2 View  Result Date: 08/10/2018 CLINICAL DATA:  Cough and congestion for several days EXAM: CHEST - 2 VIEW COMPARISON:  None. FINDINGS: The heart size and mediastinal contours are within normal limits. Both lungs are clear. The visualized skeletal structures are unremarkable. IMPRESSION: No active cardiopulmonary disease. Electronically Signed   By: Inez Catalina M.D.   On: 08/10/2018 08:31    ____________________________________________   PROCEDURES  Procedure(s) performed (including Critical Care):  Procedures   ____________________________________________   INITIAL IMPRESSION / ASSESSMENT AND PLAN / ED COURSE  As part of my medical decision making, I reviewed the following data within the [place]         Patient presents with body aches, fever, cough or shortness of breath for 3 days.  Patient test positive for influenza B.  Patient given discharge care instruction advised take medication as directed.  Patient advised follow-up [facility].      ____________________________________________   FINAL CLINICAL IMPRESSION(S) / ED DIAGNOSES  Final diagnoses:   Influenza B  Bronchospasm     ED Discharge Orders         Ordered    brompheniramine-pseudoephedrine-DM 30-2-10 MG/5ML syrup  4 times daily PRN     08/10/18 0859    oseltamivir (TAMIFLU) 75 MG capsule  2 times daily     08/10/18 0859    ibuprofen (ADVIL,MOTRIN) 600 MG tablet  Every 8 hours PRN     08/10/18 0859           Note:  This document was prepared using Dragon voice recognition software and may include unintentional dictation errors.    Sable Feil, PA-C 08/10/18 0902    Sable Feil, PA-C 08/10/18 [place], [place], MD 08/11/18 [phone]

## 2018-08-10 NOTE — ED Triage Notes (Signed)
Says sick since Friday with body aches, fever, cough, short of breath.

## 2018-08-10 NOTE — ED Notes (Signed)
See triage note  Presents with body aches ,sore throat and ear pain   Sx;s started on Friday  Unsure of fever and is afebrile on arrival

## 2018-08-15 ENCOUNTER — Emergency Department
Admission: EM | Admit: 2018-08-15 | Discharge: 2018-08-15 | Disposition: A | Discharge status: Home / Self Care | Payer: Medicaid Other | Attending: Emergency Medicine | Admitting: Emergency Medicine

## 2018-08-15 ENCOUNTER — Emergency Department
Admission: EM | Admit: 2018-08-15 | Discharge: 2018-08-16 | Disposition: A | Discharge status: Home / Self Care | Payer: Medicaid Other | Source: Home / Self Care | Attending: Emergency Medicine | Admitting: Emergency Medicine

## 2018-08-15 ENCOUNTER — Encounter: Payer: Self-pay | Admitting: Emergency Medicine

## 2018-08-15 ENCOUNTER — Other Ambulatory Visit: Payer: Self-pay

## 2018-08-15 DIAGNOSIS — H6501 Acute serous otitis media, right ear: Secondary | ICD-10-CM | POA: Insufficient documentation

## 2018-08-15 DIAGNOSIS — Z794 Long term (current) use of insulin: Secondary | ICD-10-CM | POA: Insufficient documentation

## 2018-08-15 DIAGNOSIS — H7291 Unspecified perforation of tympanic membrane, right ear: Secondary | ICD-10-CM

## 2018-08-15 DIAGNOSIS — E119 Type 2 diabetes mellitus without complications: Secondary | ICD-10-CM | POA: Insufficient documentation

## 2018-08-15 DIAGNOSIS — F1210 Cannabis abuse, uncomplicated: Secondary | ICD-10-CM

## 2018-08-15 DIAGNOSIS — H6500 Acute serous otitis media, unspecified ear: Secondary | ICD-10-CM

## 2018-08-15 DIAGNOSIS — F17210 Nicotine dependence, cigarettes, uncomplicated: Secondary | ICD-10-CM

## 2018-08-15 DIAGNOSIS — H9201 Otalgia, right ear: Secondary | ICD-10-CM | POA: Diagnosis present

## 2018-08-15 DIAGNOSIS — H9221 Otorrhagia, right ear: Secondary | ICD-10-CM

## 2018-08-15 DIAGNOSIS — R1110 Vomiting, unspecified: Secondary | ICD-10-CM | POA: Insufficient documentation

## 2018-08-15 LAB — GLUCOSE, CAPILLARY: Glucose-Capillary: 269 mg/dL — ABNORMAL HIGH (ref 70–99)

## 2018-08-15 MED ORDER — HYDROCODONE-ACETAMINOPHEN 5-325 MG PO TABS: 1.0000 | ORAL_TABLET | Freq: Four times a day (QID) | ORAL | 0 refills | Status: DC | PRN

## 2018-08-15 MED ORDER — HYDROCODONE-ACETAMINOPHEN 5-325 MG PO TABS
1.0000 | ORAL_TABLET | Freq: Once | ORAL | Status: AC
  Administered 2018-08-15: 1 via ORAL
  Filled 2018-08-15: qty 1

## 2018-08-15 MED ORDER — CIPROFLOXACIN-DEXAMETHASONE 0.3-0.1 % OT SUSP: 4.0000 [drp] | Freq: Once | OTIC | Status: DC

## 2018-08-15 MED ORDER — FENTANYL CITRATE (PF) 100 MCG/2ML IJ SOLN
100.0000 ug | Freq: Once | INTRAMUSCULAR | Status: AC
  Administered 2018-08-15: 100 ug via INTRAVENOUS
  Filled 2018-08-15: qty 2

## 2018-08-15 MED ORDER — CIPROFLOXACIN-DEXAMETHASONE 0.3-0.1 % OT SUSP
4.0000 [drp] | Freq: Two times a day (BID) | OTIC | Status: DC
  Administered 2018-08-15: 4 [drp] via OTIC
  Filled 2018-08-15: qty 7.5

## 2018-08-15 MED ORDER — PROMETHAZINE HCL 25 MG/ML IJ SOLN
12.5000 mg | Freq: Once | INTRAMUSCULAR | Status: AC
  Administered 2018-08-15: 12.5 mg via INTRAVENOUS
  Filled 2018-08-15: qty 1

## 2018-08-15 MED ORDER — AMOXICILLIN 500 MG PO CAPS: 500.0000 mg | ORAL_CAPSULE | Freq: Three times a day (TID) | ORAL | 0 refills | Status: DC

## 2018-08-15 MED ORDER — AMOXICILLIN 500 MG PO CAPS
500.0000 mg | ORAL_CAPSULE | Freq: Once | ORAL | Status: AC
  Administered 2018-08-15: 500 mg via ORAL
  Filled 2018-08-15: qty 1

## 2018-08-15 MED ORDER — SODIUM CHLORIDE 0.9 % IV BOLUS
1000.0000 mL | Freq: Once | INTRAVENOUS | Status: AC
  Administered 2018-08-15: 1000 mL via INTRAVENOUS

## 2018-08-15 NOTE — ED Triage Notes (Signed)
Pt comes POV after being seen this am for ear infection in right ear. Pt returns now for bleeding from ear, vomitting, and pain that won't relent. Pt has taken hydrocodone from today but that hasn't helped. Pt states that bleeding/d/c started about an hour ago.

## 2018-08-15 NOTE — ED Notes (Addendum)
Pt seen here earlier today and diagnosed with otitis media; returns tonight with increased pain and serous drainage from right ear; pt says it feels like his eardrum ruptured

## 2018-08-15 NOTE — ED Notes (Signed)
See triage note  States he was recently dx'd with flu  States now having right ear pain with right sided headache  Afebrile on arrival  States he has been taking IBU for pain w/o relief

## 2018-08-15 NOTE — Discharge Instructions (Signed)
Continue the amoxicillin as prescribed earlier today.  Use the drops as written on the bottle.  Schedule follow-up appointment with Dr. Tami Ribas.  Return to the emergency department for symptoms of change or worsen if you are unable to see your primary care provider or Dr. Tami Ribas.

## 2018-08-15 NOTE — ED Provider Notes (Signed)
[facility] Emergency Department Provider Note ____________________________________________  Time seen: Approximately 11:20 PM  I have reviewed the triage vital signs and the nursing notes.   HISTORY  Chief Complaint Otalgia and Emesis    HPI Darrell Walls is a 28 y.o. male presents to the emergency department for treatment and evaluation of otalgia.  He was evaluated here earlier today was diagnosed with otitis media in the right ear and placed on amoxicillin.  Approximately 1 hour ago pain acutely increased and he now has bleeding from the right ear.  He states he has been vomiting due to pain.  He has taken hydrocodone that was given to him earlier today but has not helped with the pain.   Past Medical History:  Diagnosis Date  . Diabetes mellitus without complication [facility])     Patient Active Problem List   Diagnosis Date Noted  . 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 Diabetes ([place]) 02/20/2018  . Cannabis use disorder, mild, abuse 02/20/2018  . Ibuprofen overdose 02/20/2018    Past Surgical History:  Procedure Laterality Date  . EYE SURGERY    . TONSILLECTOMY      Prior to Admission medications   Medication Sig Start Date End Date Taking? Authorizing Provider  amoxicillin (AMOXIL) 500 MG capsule Take 1 capsule (500 mg total) by mouth 3 (three) times daily. 08/15/18   Fisher, Linden Dolin, PA-C  brompheniramine-pseudoephedrine-DM 30-2-10 MG/5ML syrup Take 5 mLs by mouth 4 (four) times daily as needed. 08/10/18   Sable Feil, PA-C  HYDROcodone-acetaminophen (NORCO/VICODIN) 5-325 MG tablet Take 1 tablet by mouth every 6 (six) hours as needed for moderate pain. 08/15/18   Fisher, Linden Dolin, PA-C  ibuprofen (ADVIL,MOTRIN) 600 MG tablet Take 1 tablet (600 mg total) by mouth every 8 (eight) hours as needed. 08/10/18   Sable Feil, PA-C  insulin glargine (LANTUS) 100 UNIT/ML injection Inject 0.1 mLs (10 Units total)  into the skin daily. 07/28/18   Norval Gable, MD  Insulin Syringes, Disposable, U-100 0.3 ML MISC Use as directed 07/28/18   Norval Gable, MD    Allergies Patient has no known allergies.  Family History  Problem Relation Age of Onset  . Healthy Mother     Social History Social History   Tobacco Use  . Smoking status: Current Every Day Smoker    Types: Cigarettes  . Smokeless tobacco: Never Used  Substance Use Topics  . Alcohol use: Yes  . Drug use: Yes    Types: Marijuana    Review of Systems Constitutional: Negative for fever.  Positive for decreased ability to hear from right ear(s). Eyes: Negative for discharge or drainage. ENT:       Positive for otalgia in right ear(s).      Negative for rhinorrhea or congestion.      Negative for sore throat. Gastrointestinal: Negative for nausea, vomiting, or diarrhea. Musculoskeletal: Negative for myalgias. Skin: Negative for rash, lesions, or wounds. Neurological: Negative for paresthesias. ____________________________________________   PHYSICAL EXAM:  VITAL SIGNS: ED Triage Vitals  Enc Vitals Group     BP 08/15/18 2237 138/82     Pulse Rate 08/15/18 2237 90     Resp 08/15/18 2237 (!) 25     Temp 08/15/18 2237 98.7 F (37.1 C)     Temp Source 08/15/18 2237 Oral     SpO2 08/15/18 2237 100 %     Weight 08/15/18 2233 158 lb 11.7 oz (72 kg)  Height 08/15/18 2233 5\' 11"  (1.803 m)     Head Circumference --      Peak Flow --      Pain Score 08/15/18 2232 10     Pain Loc --      Pain Edu? --      Excl. in [place]? --     Constitutional: Acutely ill appearing. Eyes: Conjunctivae are clear without discharge or drainage. Ears:       Right TM: Ruptured TM with serous sanguinous drainage.      Left TM: Intact. Head: Atraumatic. Nose: No rhinorrhea or sinus pain on percussion. Mouth/Throat: Oropharynx normal. Tonsils flat without exudate. Hematological/Lymphatic/Immunilogical: No palpable anterior cervical  lymphadenopathy. Cardiovascular: Heart rate and rhythm are regular without murmur, gallop, or rub appreciated. Respiratory: Breath sounds are clear throughout to auscultation.  Neurologic:  Alert and oriented x 4. Skin: Intact and without rash, lesion, or wound on exposed skin surfaces. ____________________________________________   LABS (all labs ordered are listed, but only abnormal results are displayed)  Labs Reviewed  GLUCOSE, CAPILLARY - Abnormal; Notable for the following components:      Result Value   Glucose-Capillary 269 (*)    All other components within normal limits  CBG MONITORING, ED   ____________________________________________   RADIOLOGY  Not indicated ____________________________________________   PROCEDURES  Procedure(s) performed:   Procedures  ____________________________________________   INITIAL IMPRESSION / ASSESSMENT AND PLAN / ED COURSE  28 year old male presenting to the emergency department for treatment and evaluation of right otalgia.  Exam is consistent with a ruptured tympanic membrane.  Ciprodex has been ordered and then the ear will be covered with cotton in the EAC.  Patient actively vomiting and will be given fluids, Phenergan, and fentanyl. He is diabetic and reports that his last glucose today was 215.  ----------------------------------------- 12:01 AM on 08/16/2018 -----------------------------------------  Patient reported feeling much better after IV fluids, Phenergan, fentanyl, and covering the ear with cotton.  He will be given a referral to see Dr. Tami Ribas and was encouraged to call Monday morning to schedule the appointment.  He was also advised to continue the amoxicillin as prescribed earlier today and the Norco for pain.  He is to use the Ciprodex 2 times per day as well.  He was advised to see his PCP or return to the ER for symptoms that change or worsen.  Pertinent labs & imaging results that were available during my  care of the patient were reviewed by me and considered in my medical decision making (see chart for details). ____________________________________________   FINAL CLINICAL IMPRESSION(S) / ED DIAGNOSES  Final diagnoses:  Perforation of right tympanic membrane    ED Discharge Orders    None      If controlled substance prescribed during this visit, 12 month history viewed on the [place] prior to issuing an initial prescription for Schedule II or III opiod.   Note:  This document was prepared using Dragon voice recognition software and may include unintentional dictation errors.    Victorino Dike, FNP 08/16/18 Dyann Kief    Delman Kitten, MD 08/16/18 [phone]

## 2018-08-15 NOTE — ED Notes (Signed)
Pt also traveled to [place] last week and c/o cough and SOB but was seen here 3 days ago dx with flu and bronchitis.

## 2018-08-15 NOTE — ED Provider Notes (Signed)
[facility] Emergency Department Provider Note  ____________________________________________   None    (approximate)  I have reviewed the triage vital signs and the nursing notes.   HISTORY  Chief Complaint Otalgia    HPI Darrell Walls is a 28 y.o. male presents emergency department stating he was recently diagnosed with the flu but has started having severe right ear pain.  He states the right side of his head is hurting due to the ear.  He denies any vomiting.  Denies any rash.  He denies any injury to the ear.  He has been taking Tamiflu as prescribed.    Past Medical History:  Diagnosis Date  . Diabetes mellitus without complication [facility])     Patient Active Problem List   Diagnosis Date Noted  . 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 Diabetes ([place]) 02/20/2018  . Cannabis use disorder, mild, abuse 02/20/2018  . Ibuprofen overdose 02/20/2018    Past Surgical History:  Procedure Laterality Date  . EYE SURGERY    . TONSILLECTOMY      Prior to Admission medications   Medication Sig Start Date End Date Taking? Authorizing Provider  amoxicillin (AMOXIL) 500 MG capsule Take 1 capsule (500 mg total) by mouth 3 (three) times daily. 08/15/18   Torunn Chancellor, Linden Dolin, PA-C  brompheniramine-pseudoephedrine-DM 30-2-10 MG/5ML syrup Take 5 mLs by mouth 4 (four) times daily as needed. 08/10/18   Sable Feil, PA-C  HYDROcodone-acetaminophen (NORCO/VICODIN) 5-325 MG tablet Take 1 tablet by mouth every 6 (six) hours as needed for moderate pain. 08/15/18   Jasaiah Karwowski, Linden Dolin, PA-C  ibuprofen (ADVIL,MOTRIN) 600 MG tablet Take 1 tablet (600 mg total) by mouth every 8 (eight) hours as needed. 08/10/18   Sable Feil, PA-C  insulin glargine (LANTUS) 100 UNIT/ML injection Inject 0.1 mLs (10 Units total) into the skin daily. 07/28/18   Norval Gable, MD  Insulin Syringes, Disposable, U-100 0.3 ML MISC Use as directed 07/28/18   Norval Gable, MD  oseltamivir (TAMIFLU) 75 MG capsule Take 1 capsule (75 mg total) by mouth 2 (two) times daily for 5 days. 08/10/18 08/15/18  Sable Feil, PA-C    Allergies Patient has no known allergies.  Family History  Problem Relation Age of Onset  . Healthy Mother     Social History Social History   Tobacco Use  . Smoking status: Current Every Day Smoker    Types: Cigarettes  . Smokeless tobacco: Never Used  Substance Use Topics  . Alcohol use: Yes  . Drug use: Yes    Types: Marijuana    Review of Systems  Constitutional: No fever/chills Eyes: No visual changes. ENT: No sore throat.  Positive for right ear pain Respiratory: Denies cough Genitourinary: Negative for dysuria. Musculoskeletal: Negative for back pain. Skin: Negative for rash.    ____________________________________________   PHYSICAL EXAM:  VITAL SIGNS: ED Triage Vitals  Enc Vitals Group     BP 08/15/18 0738 (!) 119/91     Pulse Rate 08/15/18 0738 92     Resp 08/15/18 0738 20     Temp 08/15/18 0738 97.9 F (36.6 C)     Temp Source 08/15/18 0738 Oral     SpO2 08/15/18 0738 98 %     Weight 08/15/18 0740 160 lb (72.6 kg)     Height 08/15/18 0740 5\' 11"  (1.803 m)     Head Circumference --      Peak Flow --  Pain Score 08/15/18 0740 10     Pain Loc --      Pain Edu? --      Excl. in [place]? --     Constitutional: Alert and oriented. Well appearing and in no acute distress. Eyes: Conjunctivae are normal.  Head: Atraumatic. Ears: The right TM is bright red and swollen, no landmarks are identified, left TM is dull Nose: No congestion/rhinnorhea. Mouth/Throat: Mucous membranes are moist.  Throat appears normal Neck:  supple no lymphadenopathy noted Cardiovascular: Normal rate, regular rhythm. Heart sounds are normal Respiratory: Normal respiratory effort.  No retractions, lungs c t a  GU: deferred Musculoskeletal: FROM all extremities, warm and well perfused Neurologic:  Normal speech and  language.  Skin:  Skin is warm, dry and intact. No rash noted. Psychiatric: Mood and affect are normal. Speech and behavior are normal.  ____________________________________________   LABS (all labs ordered are listed, but only abnormal results are displayed)  Labs Reviewed - No data to display ____________________________________________   ____________________________________________  RADIOLOGY    ____________________________________________   PROCEDURES  Procedure(s) performed: No  Procedures    ____________________________________________   INITIAL IMPRESSION / ASSESSMENT AND PLAN / ED COURSE  Pertinent labs & imaging results that were available during my care of the patient were reviewed by me and considered in my medical decision making (see chart for details).   Patient is a 28 year old male presents emergency department complaining of right ear pain.  Recently diagnosed with flu.  Physical exam shows the right ear to be bright red and swollen with no visible landmarks.  Explained the findings to the patient. He was given a prescription for amoxicillin and Vicodin.  He was given a dose of amoxicillin and Vicodin while here in the emergency department.  He is to follow-up with his regular doctor if not improving in 3 days.  Return emergency department worsening.  States he understands will comply.  He was discharged in stable condition.     As part of my medical decision making, I reviewed the following data within the [place] notes reviewed and incorporated, Old chart reviewed, Notes from prior ED visits and [place] Controlled Substance Database  ____________________________________________   FINAL CLINICAL IMPRESSION(S) / ED DIAGNOSES  Final diagnoses:  Acute serous otitis media, recurrence not specified, unspecified laterality      NEW MEDICATIONS STARTED DURING THIS VISIT:  Discharge Medication List as of 08/15/2018  8:53 AM     START taking these medications   Details  amoxicillin (AMOXIL) 500 MG capsule Take 1 capsule (500 mg total) by mouth 3 (three) times daily., Starting Sat 08/15/2018, Normal    HYDROcodone-acetaminophen (NORCO/VICODIN) 5-325 MG tablet Take 1 tablet by mouth every 6 (six) hours as needed for moderate pain., Starting Sat 08/15/2018, Normal         Note:  This document was prepared using Dragon voice recognition software and may include unintentional dictation errors.    Versie Starks, PA-C 08/15/18 0941    Schuyler Amor, MD 08/16/18 [phone]

## 2018-08-15 NOTE — ED Triage Notes (Signed)
R earache since yesterday.

## 2018-08-15 NOTE — Discharge Instructions (Signed)
Follow-up with your regular doctor if not better in 3 days.  Return emergency department worsening.  Take medications as prescribed. 

## 2019-04-27 ENCOUNTER — Encounter: Payer: Self-pay | Admitting: Emergency Medicine

## 2019-04-27 ENCOUNTER — Other Ambulatory Visit: Payer: Self-pay

## 2019-04-27 ENCOUNTER — Emergency Department
Admission: EM | Admit: 2019-04-27 | Discharge: 2019-04-27 | Disposition: A | Discharge status: Home / Self Care | Payer: Medicaid Other | Attending: Emergency Medicine | Admitting: Emergency Medicine

## 2019-04-27 ENCOUNTER — Emergency Department: Payer: Medicaid Other

## 2019-04-27 DIAGNOSIS — F17210 Nicotine dependence, cigarettes, uncomplicated: Secondary | ICD-10-CM | POA: Insufficient documentation

## 2019-04-27 DIAGNOSIS — B349 Viral infection, unspecified: Secondary | ICD-10-CM | POA: Insufficient documentation

## 2019-04-27 DIAGNOSIS — Z20828 Contact with and (suspected) exposure to other viral communicable diseases: Secondary | ICD-10-CM | POA: Diagnosis not present

## 2019-04-27 DIAGNOSIS — E1065 Type 1 diabetes mellitus with hyperglycemia: Secondary | ICD-10-CM | POA: Diagnosis not present

## 2019-04-27 DIAGNOSIS — R739 Hyperglycemia, unspecified: Secondary | ICD-10-CM

## 2019-04-27 DIAGNOSIS — R52 Pain, unspecified: Secondary | ICD-10-CM | POA: Diagnosis present

## 2019-04-27 LAB — CBC WITH DIFFERENTIAL/PLATELET
Abs Immature Granulocytes: 0.03 10*3/uL (ref 0.00–0.07)
Basophils Absolute: 0 10*3/uL (ref 0.0–0.1)
Basophils Relative: 0 %
Eosinophils Absolute: 0.2 10*3/uL (ref 0.0–0.5)
Eosinophils Relative: 2 %
HCT: 40.4 % (ref 39.0–52.0)
Hemoglobin: 14.2 g/dL (ref 13.0–17.0)
Immature Granulocytes: 0 %
Lymphocytes Relative: 31 %
Lymphs Abs: 2.3 10*3/uL (ref 0.7–4.0)
MCH: 29.7 pg (ref 26.0–34.0)
MCHC: 35.1 g/dL (ref 30.0–36.0)
MCV: 84.5 fL (ref 80.0–100.0)
Monocytes Absolute: 0.4 10*3/uL (ref 0.1–1.0)
Monocytes Relative: 6 %
Neutro Abs: 4.5 10*3/uL (ref 1.7–7.7)
Neutrophils Relative %: 61 %
Platelets: 236 10*3/uL (ref 150–400)
RBC: 4.78 MIL/uL (ref 4.22–5.81)
RDW: 11 % — ABNORMAL LOW (ref 11.5–15.5)
WBC: 7.5 10*3/uL (ref 4.0–10.5)
nRBC: 0 % (ref 0.0–0.2)

## 2019-04-27 LAB — BASIC METABOLIC PANEL
Anion gap: 9 (ref 5–15)
BUN: 18 mg/dL (ref 6–20)
CO2: 25 mmol/L (ref 22–32)
Calcium: 9 mg/dL (ref 8.9–10.3)
Chloride: 100 mmol/L (ref 98–111)
Creatinine, Ser: 0.89 mg/dL (ref 0.61–1.24)
GFR calc Af Amer: >60 mL/min (ref >60)
GFR calc non Af Amer: >60 mL/min (ref >60)
Glucose, Bld: 452 mg/dL — ABNORMAL HIGH (ref 70–99)
Potassium: 4.7 mmol/L (ref 3.5–5.1)
Sodium: 134 mmol/L — ABNORMAL LOW (ref 135–145)

## 2019-04-27 LAB — GLUCOSE, CAPILLARY
Glucose-Capillary: 477 mg/dL — ABNORMAL HIGH (ref 70–99)
Glucose-Capillary: 254 mg/dL — ABNORMAL HIGH (ref 70–99)

## 2019-04-27 MED ORDER — INSULIN ASPART 100 UNIT/ML ~~LOC~~ SOLN
10.0000 [IU] | Freq: Once | SUBCUTANEOUS | Status: AC
  Administered 2019-04-27: 10 [IU] via INTRAVENOUS
  Filled 2019-04-27: qty 1

## 2019-04-27 MED ORDER — SODIUM CHLORIDE 0.9 % IV BOLUS
1000.0000 mL | Freq: Once | INTRAVENOUS | Status: AC
  Administered 2019-04-27: 1000 mL via INTRAVENOUS

## 2019-04-27 MED ORDER — IBUPROFEN 600 MG PO TABS: 600.0000 mg | ORAL_TABLET | Freq: Three times a day (TID) | ORAL | 0 refills | Status: DC | PRN

## 2019-04-27 MED ORDER — BENZONATATE 100 MG PO CAPS: 200.0000 mg | ORAL_CAPSULE | Freq: Three times a day (TID) | ORAL | 0 refills | Status: DC | PRN

## 2019-04-27 NOTE — ED Triage Notes (Signed)
Presents with some body aches  unable to taste foods  Also having some discomfort in chest with inspiration

## 2019-04-27 NOTE — ED Provider Notes (Signed)
[facility] Emergency Department Provider Note   ____________________________________________   First MD Initiated Contact with Patient 04/27/19 1306     (approximate)  I have reviewed the triage vital signs and the nursing notes.   HISTORY  Chief Complaint Generalized Body Aches    HPI Darrell Walls is a 28 y.o. male patient presents with body aches, unable to taste food, and discomfort in chest with deep inspirations.  Patient states positive exposure to COVID-19 5 days ago.  Patient his complaint started yesterday.  Patient denies sore throat.  Patient denies nausea, vomiting, diarrhea.  No palliative measure for complaint.  Patient is also type I diabetic and uses an over-the-counter insulin.  Patient state cannot get his regular prescribed insulin due to insurance issues with his company.  Patient state taking insulin irregularly secondary to cost.  Last injection was greater than 3 days ago.         Past Medical History:  Diagnosis Date  . Diabetes mellitus without complication [facility])     Patient Active Problem List   Diagnosis Date Noted  . 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 Diabetes ([place]) 02/20/2018  . Cannabis use disorder, mild, abuse 02/20/2018  . Ibuprofen overdose 02/20/2018    Past Surgical History:  Procedure Laterality Date  . EYE SURGERY    . TONSILLECTOMY      Prior to Admission medications   Medication Sig Start Date End Date Taking? Authorizing Provider  benzonatate (TESSALON PERLES) 100 MG capsule Take 2 capsules (200 mg total) by mouth 3 (three) times daily as needed. 04/27/19 04/26/20  Sable Feil, PA-C  ibuprofen (ADVIL) 600 MG tablet Take 1 tablet (600 mg total) by mouth every 8 (eight) hours as needed. 04/27/19   Sable Feil, PA-C  insulin glargine (LANTUS) 100 UNIT/ML injection Inject 0.1 mLs (10 Units total) into the skin daily. 07/28/18   Norval Gable, MD  Insulin  Syringes, Disposable, U-100 0.3 ML MISC Use as directed 07/28/18   Norval Gable, MD    Allergies Patient has no known allergies.  Family History  Problem Relation Age of Onset  . Healthy Mother     Social History Social History   Tobacco Use  . Smoking status: Current Every Day Smoker    Types: Cigarettes  . Smokeless tobacco: Never Used  Substance Use Topics  . Alcohol use: Yes  . Drug use: Yes    Types: Marijuana    Review of Systems Constitutional: No fever/chills Eyes: No visual changes. ENT: No sore throat. Cardiovascular: Denies chest pain. Respiratory: Denies shortness of breath. Gastrointestinal: No abdominal pain.  No nausea, no vomiting.  No diarrhea.  No constipation. Genitourinary: Negative for dysuria. Musculoskeletal: Negative for back pain. Skin: Negative for rash. Neurological: Negative for headaches, focal weakness or numbness. Psychiatric:  Adjustment disorder. Endocrine:  Diabetes ____________________________________________   PHYSICAL EXAM:  VITAL SIGNS: ED Triage Vitals  Enc Vitals Group     BP      Pulse      Resp      Temp      Temp src      SpO2      Weight      Height      Head Circumference      Peak Flow      Pain Score      Pain Loc      Pain Edu?      Excl. in [place]?  Constitutional: Alert and oriented. Well appearing and in no acute distress. Eyes: Conjunctivae are normal. PERRL. EOMI. Head: Atraumatic. Nose: No congestion/rhinnorhea. Mouth/Throat: Mucous membranes are moist.  Oropharynx non-erythematous. Neck: No stridor.   Hematological/Lymphatic/Immunilogical: No cervical lymphadenopathy. Cardiovascular: Normal rate, regular rhythm. Grossly normal heart sounds.  Good peripheral circulation. Respiratory: Normal respiratory effort.  No retractions. Lungs CTAB. Gastrointestinal: Soft and nontender. No distention. No abdominal bruits. No CVA tenderness. Musculoskeletal: No lower extremity tenderness nor edema.  No  joint effusions. Neurologic:  Normal speech and language. No gross focal neurologic deficits are appreciated. No gait instability. Skin:  Skin is warm, dry and intact. No rash noted. Psychiatric: Mood and affect are normal. Speech and behavior are normal.  ____________________________________________   LABS (all labs ordered are listed, but only abnormal results are displayed)  Labs Reviewed  GLUCOSE, CAPILLARY - Abnormal; Notable for the following components:      Result Value   Glucose-Capillary 477 (*)    All other components within normal limits  BASIC METABOLIC PANEL - Abnormal; Notable for the following components:   Sodium 134 (*)    Glucose, Bld 452 (*)    All other components within normal limits  CBC WITH DIFFERENTIAL/PLATELET - Abnormal; Notable for the following components:   RDW 11.0 (*)    All other components within normal limits  GLUCOSE, CAPILLARY - Abnormal; Notable for the following components:   Glucose-Capillary 254 (*)    All other components within normal limits  SARS CORONAVIRUS 2 (TAT 6-24 HRS)  CBG MONITORING, ED  CBG MONITORING, ED   ____________________________________________  EKG   ____________________________________________  RADIOLOGY  ED MD interpretation:    Official radiology report(s): Dg Chest Portable 1 View  Result Date: 04/27/2019 CLINICAL DATA:  Cough and fatigue EXAM: PORTABLE CHEST 1 VIEW COMPARISON:  August 10, 2018 FINDINGS: The lungs are clear. The heart size and pulmonary vascularity are normal. No adenopathy. No bone lesions. IMPRESSION: No edema or consolidation. Electronically Signed   By: Lowella Grip III M.D.   On: 04/27/2019 13:30    ____________________________________________   PROCEDURES  Procedure(s) performed (including Critical Care):  Procedures   ____________________________________________   INITIAL IMPRESSION / ASSESSMENT AND PLAN / ED COURSE  As part of my medical decision making, I  reviewed the following data within the [place]     Patient presents with generalized body aches inability to taste food and also chest discomfort with inspiration.  Patient also has no productive cough.  Patient is diabetic and his glucose was in the 400s.  Patient missed noncompliance of insulin.  Patient glucose levels decreased with IV fluids and 10 units of insulin.  Patient advised to follow-up PCP for reevaluation of his insulin dosage.  Patient also advised self quarantine pending results of COVID-19 test.    Darrell Walls was evaluated in Emergency Department on 04/27/2019 for the symptoms described in the history of present illness. He was evaluated in the context of the global COVID-19 pandemic, which necessitated consideration that the patient might be at risk for infection with the SARS-CoV-2 virus that causes COVID-19. Institutional protocols and algorithms that pertain to the evaluation of patients at risk for COVID-19 are in a state of rapid change based on information released by regulatory bodies including the CDC and federal and state organizations. These policies and algorithms were followed during the patient's care in the ED.       ____________________________________________   FINAL CLINICAL IMPRESSION(S) / ED DIAGNOSES  Final  diagnoses:  Viral illness  Blood glucose elevated     ED Discharge Orders         Ordered    ibuprofen (ADVIL) 600 MG tablet  Every 8 hours PRN     04/27/19 1351    benzonatate (TESSALON PERLES) 100 MG capsule  3 times daily PRN     04/27/19 1351           Note:  This document was prepared using Dragon voice recognition software and may include unintentional dictation errors.    Sable Feil, PA-C 04/27/19 1541    Earleen Newport, MD 04/28/19 [phone]

## 2019-04-27 NOTE — Discharge Instructions (Addendum)
Follow discharge care instruction advised self quarantine until results of COVID-19 test.  You may follow your results in the MyChart app.  Restart insulin and follow-up with treating doctor per schedule.

## 2019-04-28 LAB — SARS CORONAVIRUS 2 (TAT 6-24 HRS): SARS Coronavirus 2: NEGATIVE

## 2019-07-23 ENCOUNTER — Emergency Department
Admission: EM | Admit: 2019-07-23 | Discharge: 2019-07-23 | Disposition: A | Discharge status: Home / Self Care | Payer: Medicaid Other | Attending: Student in an Organized Health Care Education/Training Program | Admitting: Student in an Organized Health Care Education/Training Program

## 2019-07-23 ENCOUNTER — Encounter: Payer: Self-pay | Admitting: Emergency Medicine

## 2019-07-23 ENCOUNTER — Other Ambulatory Visit: Payer: Self-pay

## 2019-07-23 DIAGNOSIS — E119 Type 2 diabetes mellitus without complications: Secondary | ICD-10-CM | POA: Diagnosis not present

## 2019-07-23 DIAGNOSIS — K047 Periapical abscess without sinus: Secondary | ICD-10-CM | POA: Diagnosis not present

## 2019-07-23 DIAGNOSIS — K0889 Other specified disorders of teeth and supporting structures: Secondary | ICD-10-CM | POA: Diagnosis present

## 2019-07-23 DIAGNOSIS — Z794 Long term (current) use of insulin: Secondary | ICD-10-CM | POA: Diagnosis not present

## 2019-07-23 DIAGNOSIS — F17210 Nicotine dependence, cigarettes, uncomplicated: Secondary | ICD-10-CM | POA: Insufficient documentation

## 2019-07-23 MED ORDER — AMOXICILLIN 500 MG PO TABS: 500.0000 mg | ORAL_TABLET | Freq: Three times a day (TID) | ORAL | 0 refills | Status: DC

## 2019-07-23 MED ORDER — NAPROXEN 500 MG PO TABS: 500.0000 mg | ORAL_TABLET | Freq: Two times a day (BID) | ORAL | 0 refills | Status: DC

## 2019-07-23 MED ORDER — TRAMADOL HCL 50 MG PO TABS: 50.0000 mg | ORAL_TABLET | Freq: Four times a day (QID) | ORAL | 0 refills | Status: DC | PRN

## 2019-07-23 NOTE — ED Notes (Signed)
See triage note  Presents with possible dental abscess  States he developed pain to left upper gumline about 2 days ago

## 2019-07-23 NOTE — ED Triage Notes (Signed)
C/O left upper mouth pain x 2 days.

## 2019-07-23 NOTE — Discharge Instructions (Signed)
Please call and schedule a dental appointment as soon as possible. You will need to be seen within the next 14 days. Return to the emergency department for symptoms that change or worsen if you're unable to schedule an appointment.  OPTIONS FOR DENTAL FOLLOW UP CARE  [place] [facility] of [facility] and [place] [email or web address] [phone])  [facility] [phone])  [place] [phone] ext [phone])  [place] [phone])  [facility] [phone]) This clinic caters to the indigent population and is on a lottery system. Location: [employer] [facility], [place], [place], [place] Clinic Hours: Wednesdays from 6pm - 9pm, patients seen by a lottery system. For dates, call or go to [email or web address] Services: Cleanings, fillings and simple extractions. Payment Options: DENTAL WORK IS FREE OF CHARGE. Bring proof of income or support. Best way to get seen: Arrive at 5:15 pm - this is a lottery, NOT first come/first serve, so arriving earlier will not increase your chances of being seen.     [facility] [phone] Select option 1 for emergencies   Location: [facility], [place], [address] [place] Clinic Hours: No walk-ins accepted - call the day before to schedule an appointment. Check in times are 9:30 am and 1:30 pm. Services: Simple extractions, temporary fillings, pulpectomy/pulp debridement, uncomplicated abscess drainage. Payment Options: PAYMENT IS DUE AT THE TIME OF SERVICE.  Fee is usually $100-200, additional surgical procedures (e.g. abscess drainage) may be extra. Cash, checks, Visa/MasterCard accepted.  Can file Medicaid if patient is covered for dental - patient should call case worker to check. No  discount for [facility] patients. Best way to get seen: MUST call the day before and get onto the schedule. Can usually be seen the next 1-2 days. No walk-ins accepted.     [facility] [phone]   Location: [place], [place] Clinic Hours: M, W, Th, F 8am or 1:30pm, Tues 9a or 1:30 - first come/first served. Services: Simple extractions, temporary fillings, uncomplicated abscess drainage.  You do not need to be an [facility] resident. Payment Options: PAYMENT IS DUE AT THE TIME OF SERVICE. Dental insurance, otherwise sliding scale - bring proof of income or support. Depending on income and treatment needed, cost is usually $50-200. Best way to get seen: Arrive early as it is first come/first served.     [facility] [phone]   Location: [place] [facility] Hours: Mon-Thu 8a-5p Services: Most basic dental services including extractions and fillings. Payment Options: PAYMENT IS DUE AT THE TIME OF SERVICE. Sliding scale, up to 50% off - bring proof if income or support. Medicaid with dental option accepted. Best way to get seen: Call to schedule an appointment, can usually be seen within 2 weeks OR they will try to see walk-ins - show up at [place] or 2p (you may have to wait).     [facility] [place] RESIDENTS ONLY   Location: [facility], [place] [address] [place], [place] [address] Clinic Hours: By appointment only. Monday - Thursday 8am-5pm, Friday 8am-12pm Services: Cleanings, fillings, extractions. Payment Options: PAYMENT IS DUE AT THE TIME OF SERVICE. Cash, Visa or MasterCard. Sliding scale - $30 minimum per service. Best way to get seen: Come in to office, complete packet and make an appointment -  need proof of income or support monies for each household member and proof of [facility]  residence. Usually takes about a month to get in.     [facility] [phone]   Location: [address], [place] Clinic Hours: Walk-in Urgent Care Dental Services are offered Monday-Friday mornings only. The numbers of emergencies accepted daily is limited to the number of providers available. Maximum 15 - Mondays, Wednesdays & Thursdays Maximum 10 - Tuesdays & Fridays Services: You do not need to be a [facility] resident to be seen for a dental emergency. Emergencies are defined as pain, swelling, abnormal bleeding, or dental trauma. Walkins will receive x-rays if needed. NOTE: Dental cleaning is not an emergency. Payment Options: PAYMENT IS DUE AT THE TIME OF SERVICE. Minimum co-pay is $40.00 for uninsured patients. Minimum co-pay is $3.00 for Medicaid with dental coverage. Dental Insurance is accepted and must be presented at time of visit. Medicare does not cover dental. Forms of payment: Cash, credit card, checks. Best way to get seen: If not previously registered with the clinic, walk-in dental registration begins at 7:15 am and is on a first come/first serve basis. If previously registered with the clinic, call to make an appointment.     [facility] [place] ONLY   Location: [address], [place], [place] Clinic Hours: Mon-Thu 10a-2p Services: Extractions only! Payment Options: FREE (donations accepted) - bring proof of income or support Best way to get seen: Call and schedule an appointment OR come at 8am on the 1st Monday of every month (except for holidays) when it is first come/first served.     [facility] [phone]   Location: [place], [place] Clinic Hours: Friday mornings Services, Payment Options, Best way to get seen: Call for info

## 2019-07-24 NOTE — ED Provider Notes (Signed)
[facility] Emergency Department Provider Note ____________________________________________  Time seen: Approximately 11:42 PM  I have reviewed the triage vital signs and the nursing notes.   HISTORY  Chief Complaint Dental Pain   HPI Darrell Walls is a 29 y.o. male presenting to the emergency department for treatment and evaluation of dental pain that started 2 days ago.  No relief with ibuprofen.  Patient denies fever or other symptoms of concern.   Past Medical History:  Diagnosis Date  . Diabetes mellitus without complication [facility])     Patient Active Problem List   Diagnosis Date Noted  . 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 Diabetes ([place]) 02/20/2018  . Cannabis use disorder, mild, abuse 02/20/2018  . Ibuprofen overdose 02/20/2018    Past Surgical History:  Procedure Laterality Date  . EYE SURGERY    . TONSILLECTOMY      Prior to Admission medications   Medication Sig Start Date End Date Taking? Authorizing Provider  amoxicillin (AMOXIL) 500 MG tablet Take 1 tablet (500 mg total) by mouth 3 (three) times daily. 07/23/19   Lindzee Gouge B, FNP  insulin glargine (LANTUS) 100 UNIT/ML injection Inject 0.1 mLs (10 Units total) into the skin daily. 07/28/18   Norval Gable, MD  Insulin Syringes, Disposable, U-100 0.3 ML MISC Use as directed 07/28/18   Norval Gable, MD  naproxen (NAPROSYN) 500 MG tablet Take 1 tablet (500 mg total) by mouth 2 (two) times daily with a meal. 07/23/19   Jasman Pfeifle B, FNP  traMADol (ULTRAM) 50 MG tablet Take 1 tablet (50 mg total) by mouth every 6 (six) hours as needed. 07/23/19   Victorino Dike, FNP    Allergies Patient has no known allergies.  Family History  Problem Relation Age of Onset  . Healthy Mother     Social History Social History   Tobacco Use  . Smoking status: Current Every Day Smoker    Types: Cigarettes  . Smokeless tobacco: Never Used  Substance  Use Topics  . Alcohol use: Yes  . Drug use: Yes    Types: Marijuana    Review of Systems Constitutional: Negative for fever or recent illness. ENT: Positive for dental pain. Musculoskeletal: Negative for trismus of the jaw.  Skin: Negative for wound or lesion. ____________________________________________   PHYSICAL EXAM:  VITAL SIGNS: ED Triage Vitals  Enc Vitals Group     BP 07/23/19 0840 (!) 149/94     Pulse Rate 07/23/19 0840 (!) 105     Resp 07/23/19 0840 16     Temp 07/23/19 0840 98.2 F (36.8 C)     Temp Source 07/23/19 0840 Oral     SpO2 07/23/19 0840 98 %     Weight 07/23/19 0839 164 lb 14.5 oz (74.8 kg)     Height 07/23/19 0839 5\' 11"  (1.803 m)     Head Circumference --      Peak Flow --      Pain Score 07/23/19 0839 5     Pain Loc --      Pain Edu? --      Excl. in [place]? --     Constitutional: Alert and oriented. Well appearing and in no acute distress. Eyes: Conjunctiva are clear without discharge or drainage. Mouth/Throat: Airway is patent, no edema of the tongue, no sublingual induration Periodontal Exam    Hematological/Lymphatic/Immunilogical: No anterior cervical lymphadenopathy noted on exam. Respiratory: Respirations even and unlabored. Musculoskeletal: Full ROM of the  jaw. Neurologic: Awake, alert, oriented.  Skin: No facial swelling Psychiatric: Affect and behavior intact.  ____________________________________________   LABS (all labs ordered are listed, but only abnormal results are displayed)  Labs Reviewed - No data to display ____________________________________________   RADIOLOGY  Not indicated. ____________________________________________   PROCEDURES  Procedure(s) performed:   Procedures  Critical Care performed: No ____________________________________________   INITIAL IMPRESSION / ASSESSMENT AND PLAN / ED COURSE  [name] is a 29 y.o. male presenting to the emergency department for dental pain.  He will be  treated with amoxicillin, Naprosyn, tramadol.  He was given community resources for dental follow-up.  He was advised to return to the emergency department for symptoms of change or worsen if unable to schedule appointment.  Pertinent labs & imaging results that were available during my care of the patient were reviewed by me and considered in my medical decision making (see chart for details).  ____________________________________________   FINAL CLINICAL IMPRESSION(S) / ED DIAGNOSES  Final diagnoses:  Dental abscess    Discharge Medication List as of 07/23/2019 10:47 AM    START taking these medications   Details  amoxicillin (AMOXIL) 500 MG tablet Take 1 tablet (500 mg total) by mouth 3 (three) times daily., Starting Fri 07/23/2019, Normal    naproxen (NAPROSYN) 500 MG tablet Take 1 tablet (500 mg total) by mouth 2 (two) times daily with a meal., Starting Fri 07/23/2019, Normal    traMADol (ULTRAM) 50 MG tablet Take 1 tablet (50 mg total) by mouth every 6 (six) hours as needed., Starting Fri 07/23/2019, Normal        If controlled substance prescribed during this visit, 12 month history viewed on the [place] prior to issuing an initial prescription for Schedule II or III opiod.  Note:  This document was prepared using Dragon voice recognition software and may include unintentional dictation errors.   Victorino Dike, FNP 07/24/19 2344    Merlyn Lot, MD 07/26/19 1204

## 2019-09-23 ENCOUNTER — Other Ambulatory Visit: Payer: Self-pay

## 2019-09-23 ENCOUNTER — Emergency Department
Admission: EM | Admit: 2019-09-23 | Discharge: 2019-09-23 | Disposition: A | Discharge status: Home / Self Care | Payer: Medicaid Other | Attending: Emergency Medicine | Admitting: Emergency Medicine

## 2019-09-23 DIAGNOSIS — E1165 Type 2 diabetes mellitus with hyperglycemia: Secondary | ICD-10-CM | POA: Diagnosis not present

## 2019-09-23 DIAGNOSIS — Z794 Long term (current) use of insulin: Secondary | ICD-10-CM | POA: Diagnosis not present

## 2019-09-23 DIAGNOSIS — R350 Frequency of micturition: Secondary | ICD-10-CM | POA: Diagnosis present

## 2019-09-23 DIAGNOSIS — F17210 Nicotine dependence, cigarettes, uncomplicated: Secondary | ICD-10-CM | POA: Insufficient documentation

## 2019-09-23 DIAGNOSIS — R739 Hyperglycemia, unspecified: Secondary | ICD-10-CM

## 2019-09-23 LAB — BASIC METABOLIC PANEL
Anion gap: 7 (ref 5–15)
BUN: 21 mg/dL — ABNORMAL HIGH (ref 6–20)
CO2: 25 mmol/L (ref 22–32)
Calcium: 9 mg/dL (ref 8.9–10.3)
Chloride: 101 mmol/L (ref 98–111)
Creatinine, Ser: 0.92 mg/dL (ref 0.61–1.24)
GFR calc Af Amer: >60 mL/min (ref >60)
GFR calc non Af Amer: >60 mL/min (ref >60)
Glucose, Bld: 504 mg/dL (ref 70–99)
Potassium: 4.5 mmol/L (ref 3.5–5.1)
Sodium: 133 mmol/L — ABNORMAL LOW (ref 135–145)

## 2019-09-23 LAB — CBC
HCT: 38.4 % — ABNORMAL LOW (ref 39.0–52.0)
Hemoglobin: 13.6 g/dL (ref 13.0–17.0)
MCH: 29.8 pg (ref 26.0–34.0)
MCHC: 35.4 g/dL (ref 30.0–36.0)
MCV: 84 fL (ref 80.0–100.0)
Platelets: 240 10*3/uL (ref 150–400)
RBC: 4.57 MIL/uL (ref 4.22–5.81)
RDW: 11 % — ABNORMAL LOW (ref 11.5–15.5)
WBC: 5.7 10*3/uL (ref 4.0–10.5)
nRBC: 0 % (ref 0.0–0.2)

## 2019-09-23 LAB — GLUCOSE, CAPILLARY
Glucose-Capillary: 428 mg/dL — ABNORMAL HIGH (ref 70–99)
Glucose-Capillary: 287 mg/dL — ABNORMAL HIGH (ref 70–99)

## 2019-09-23 MED ORDER — INSULIN PEN NEEDLE 29G X 5MM MISC: 1 refills | Status: AC

## 2019-09-23 MED ORDER — INSULIN ASPART 100 UNIT/ML ~~LOC~~ SOLN
10.0000 [IU] | Freq: Once | SUBCUTANEOUS | Status: AC
  Administered 2019-09-23: 10 [IU] via SUBCUTANEOUS
  Filled 2019-09-23: qty 1

## 2019-09-23 MED ORDER — SODIUM CHLORIDE 0.9 % IV SOLN
1000.0000 mL | Freq: Once | INTRAVENOUS | Status: AC
  Administered 2019-09-23: 1000 mL via INTRAVENOUS

## 2019-09-23 MED ORDER — INSULIN GLARGINE 100 UNIT/ML ~~LOC~~ SOLN: 10.0000 [IU] | Freq: Every day | SUBCUTANEOUS | 0 refills | Status: DC

## 2019-09-23 NOTE — ED Notes (Signed)
Repeat CBG obtained by this RN. Pt tolerated well. Lights dimmed for patient comfort.

## 2019-09-23 NOTE — ED Provider Notes (Signed)
[facility] Emergency Department Provider Note   ____________________________________________    I have reviewed the triage vital signs and the nursing notes.   HISTORY  Chief Complaint Hyperglycemia     HPI Darrell Walls is a 29 y.o. male with a history of diabetes who reports he is recently moved to the area and has been out of his insulin and that his glucose has been elevated.  Patient reports he has had frequent urination, some fatigue and feels thirsty all the time.  Has not been able to see PCP yet but does have appointment made.  No fevers chills nausea or vomiting.  No abdominal pain.  No rash.  Has not take anything for this  Past Medical History:  Diagnosis Date  . Diabetes mellitus without complication [facility])     Patient Active Problem List   Diagnosis Date Noted  . 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 Diabetes ([place]) 02/20/2018  . Cannabis use disorder, mild, abuse 02/20/2018  . Ibuprofen overdose 02/20/2018    Past Surgical History:  Procedure Laterality Date  . EYE SURGERY    . TONSILLECTOMY      Prior to Admission medications   Medication Sig Start Date End Date Taking? Authorizing Provider  insulin glargine (LANTUS) 100 UNIT/ML injection Inject 0.1 mLs (10 Units total) into the skin daily. 09/23/19   Lavonia Drafts, MD  Insulin Pen Needle 29G X 5MM MISC Please dispense 1 box of 100 insulin needles and syringes 09/23/19   Lavonia Drafts, MD     Allergies Patient has no known allergies.  Family History  Problem Relation Age of Onset  . Healthy Mother     Social History Social History   Tobacco Use  . Smoking status: Current Every Day Smoker    Types: Cigarettes  . Smokeless tobacco: Never Used  Substance Use Topics  . Alcohol use: Yes  . Drug use: Yes    Types: Marijuana    Review of Systems  Constitutional: No fever/chills Eyes: No visual changes.  ENT: No sore  throat. Cardiovascular: Denies chest pain. Respiratory: Denies shortness of breath. Gastrointestinal: No abdominal pain.  No nausea, no vomiting.   Genitourinary: Negative for dysuria. Musculoskeletal: Negative for back pain. Skin: Negative for rash. Neurological: Negative for headaches or weakness   ____________________________________________   PHYSICAL EXAM:  VITAL SIGNS: ED Triage Vitals  Enc Vitals Group     BP 09/23/19 0952 (!) 135/92     Pulse Rate 09/23/19 0952 100     Resp 09/23/19 0952 18     Temp 09/23/19 0952 98.1 F (36.7 C)     Temp Source 09/23/19 0952 Oral     SpO2 09/23/19 0952 97 %     Weight 09/23/19 0953 72.6 kg (160 lb)     Height 09/23/19 0953 1.803 m (5\' 11" )     Head Circumference --      Peak Flow --      Pain Score 09/23/19 0953 6     Pain Loc --      Pain Edu? --      Excl. in [place]? --     Constitutional: Alert and oriented.   Nose: No congestion/rhinnorhea. Mouth/Throat: Mucous membranes are moist.    Cardiovascular: Normal rate, regular rhythm.  Good peripheral circulation. Respiratory: Normal respiratory effort.  No retractions.  Gastrointestinal: Soft and nontender. No distention.   Musculoskeletal:.  Warm and well perfused Neurologic:  Normal speech and  language. No gross focal neurologic deficits are appreciated.  Skin:  Skin is warm, dry and intact. No rash noted. Psychiatric: Mood and affect are normal. Speech and behavior are normal.  ____________________________________________   LABS (all labs ordered are listed, but only abnormal results are displayed)  Labs Reviewed  BASIC METABOLIC PANEL - Abnormal; Notable for the following components:      Result Value   Sodium 133 (*)    Glucose, Bld 504 (*)    BUN 21 (*)    All other components within normal limits  CBC - Abnormal; Notable for the following components:   HCT 38.4 (*)    RDW 11.0 (*)    All other components within normal limits  GLUCOSE, CAPILLARY - Abnormal;  Notable for the following components:   Glucose-Capillary 428 (*)    All other components within normal limits  GLUCOSE, CAPILLARY - Abnormal; Notable for the following components:   Glucose-Capillary 287 (*)    All other components within normal limits  URINALYSIS, COMPLETE (UACMP) WITH MICROSCOPIC  CBG MONITORING, ED  CBG MONITORING, ED  CBG MONITORING, ED   ____________________________________________  EKG  None ____________________________________________  RADIOLOGY  None ____________________________________________   PROCEDURES  Procedure(s) performed: No  Procedures   Critical Care performed: No ____________________________________________   INITIAL IMPRESSION / ASSESSMENT AND PLAN / ED COURSE  Pertinent labs & imaging results that were available during my care of the patient were reviewed by me and considered in my medical decision making (see chart for details).  Patient overall well-appearing on exam, vital signs are reassuring.  Glucose noted to be 504 on BMP but normal anion gap.  IV fluids given as well as 10 units of subcu insulin with excellent response  Patient's glucose has decreased to the 200s and he is feeling significantly better.  Refilled his Lantus prescription at his request, strongly urged close PCP follow-up for adjustment of insulin dosing and medications.    ____________________________________________   FINAL CLINICAL IMPRESSION(S) / ED DIAGNOSES  Final diagnoses:  Hyperglycemia        Note:  This document was prepared using Dragon voice recognition software and may include unintentional dictation errors.   Lavonia Drafts, MD 09/23/19 1341

## 2019-09-23 NOTE — ED Notes (Signed)
Date and time results received: 09/23/19 10:27 AM  (use smartphrase ".now" to insert current time)  Test: Serum glucose Critical Value: 504  Name of Provider Notified: [place]  Orders Received? Or Actions Taken?: EDP made aware via secure chat, awaiting orders

## 2019-09-23 NOTE — ED Triage Notes (Signed)
Pt has not had his insulin for 2 weeks and is not feeling well. Pt in no distress at present moment.

## 2019-09-23 NOTE — ED Notes (Signed)
Pt visualized in NAD at this time, resting in bed playing on cell phone.

## 2019-09-23 NOTE — ED Notes (Signed)
ED Provider at bedside. 

## 2019-09-23 NOTE — ED Notes (Signed)
Pt requesting water, EDP approved water or diet drinks. Pt given warm blanket per request and water. No other needs at this time.

## 2019-11-02 ENCOUNTER — Other Ambulatory Visit: Payer: Self-pay

## 2019-11-02 ENCOUNTER — Encounter: Payer: Self-pay | Admitting: *Deleted

## 2019-11-02 ENCOUNTER — Emergency Department
Admission: EM | Admit: 2019-11-02 | Discharge: 2019-11-02 | Disposition: A | Discharge status: Home / Self Care | Payer: Medicaid Other | Attending: Emergency Medicine | Admitting: Emergency Medicine

## 2019-11-02 DIAGNOSIS — K0889 Other specified disorders of teeth and supporting structures: Secondary | ICD-10-CM | POA: Insufficient documentation

## 2019-11-02 DIAGNOSIS — Z5321 Procedure and treatment not carried out due to patient leaving prior to being seen by health care provider: Secondary | ICD-10-CM | POA: Insufficient documentation

## 2019-11-02 NOTE — ED Triage Notes (Signed)
Pt has left upper tooth pain for 2 days. Taking motrin without relief.  Pt alert.

## 2019-11-02 NOTE — ED Notes (Signed)
No answer when called several times from lobby 

## 2019-12-07 DIAGNOSIS — F17200 Nicotine dependence, unspecified, uncomplicated: Secondary | ICD-10-CM | POA: Insufficient documentation

## 2020-06-17 IMAGING — CR CHEST - 2 VIEW
1 series · 2 of 2 positions shown · non-contrast
Comparison: None.

CLINICAL DATA: Cough and congestion for several days

EXAM:
CHEST - 2 VIEW

[Series 1: dg chest 2 view · 0.14mm/px · 2 of 2 slices shown]
[im 1/2]
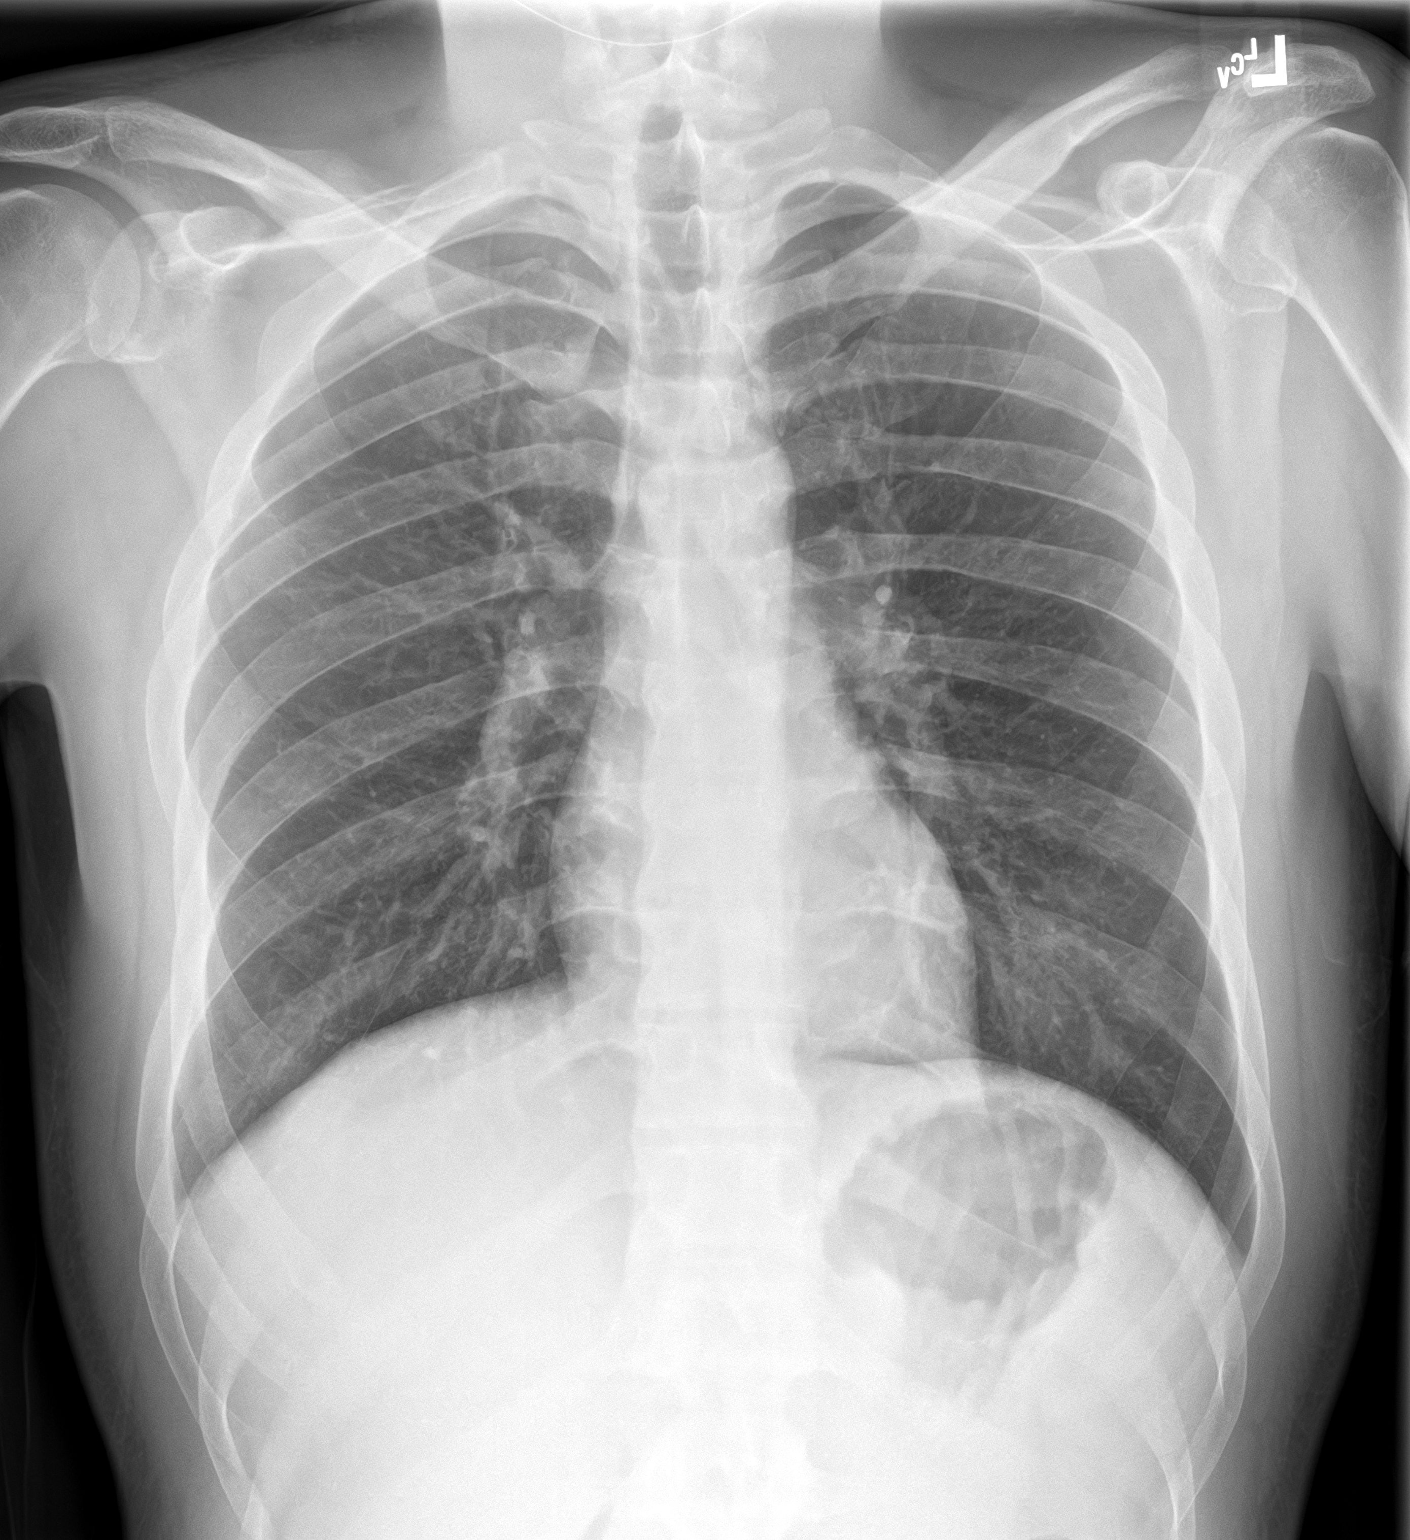
[im 2/2]
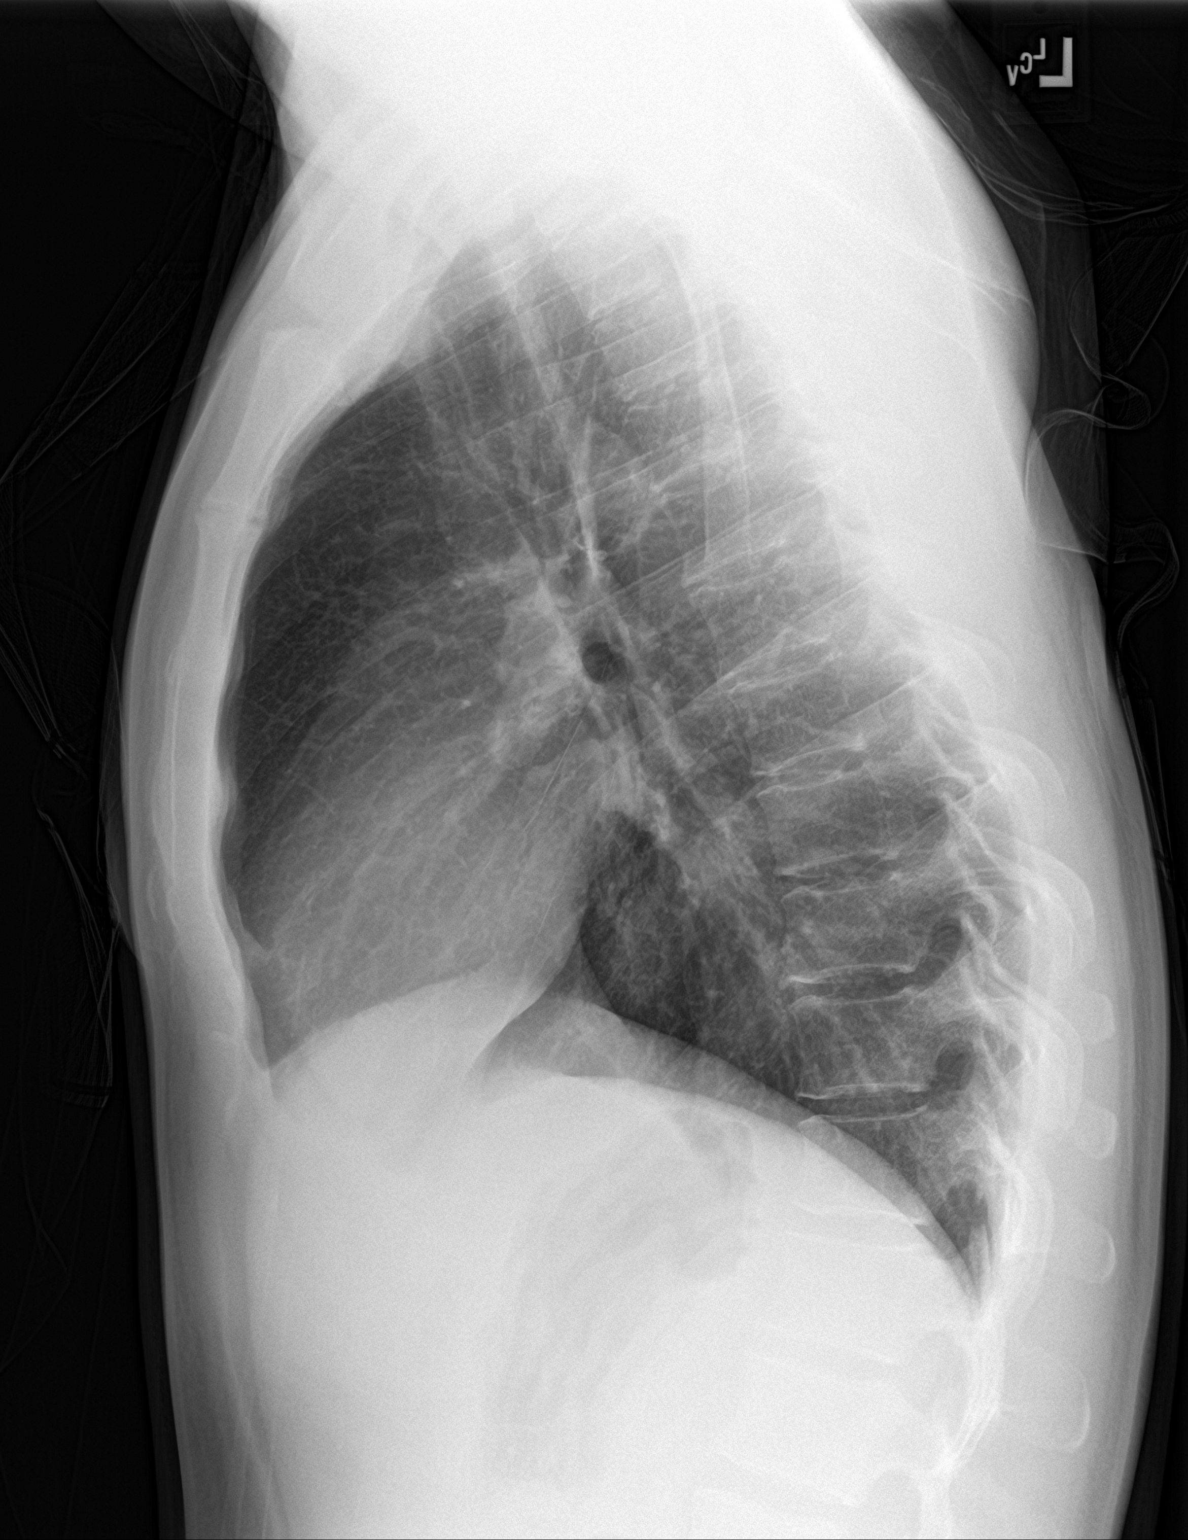

[2 of 2 positions shown; findings below may reference images not displayed]

FINDINGS: The heart size and mediastinal contours are within normal limits.
Both lungs are clear. The visualized skeletal structures are
unremarkable.
IMPRESSION: No active cardiopulmonary disease.

## 2021-03-04 IMAGING — DX DG CHEST 1V PORT
1 series · 1 of 1 positions shown · non-contrast
Comparison: August 10, 2018

CLINICAL DATA: Cough and fatigue

EXAM:
PORTABLE CHEST 1 VIEW

[chest ap]
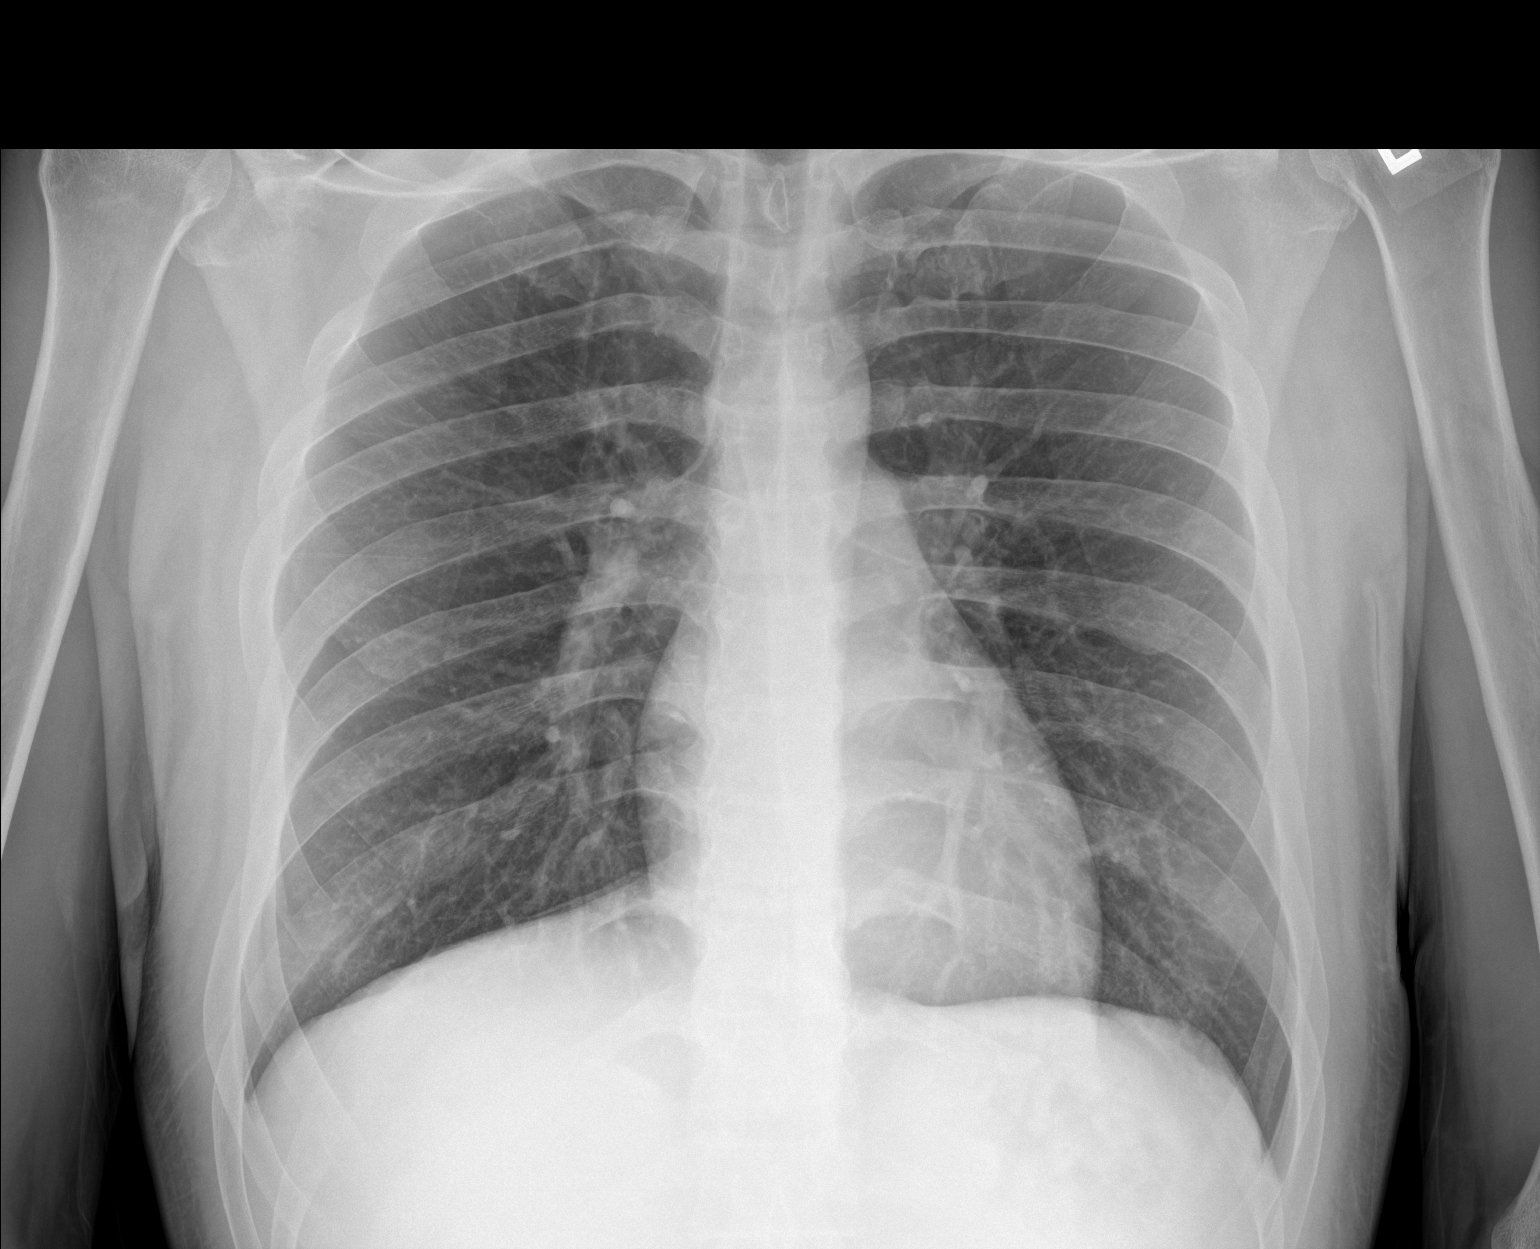

[1 of 1 positions shown; findings below may reference images not displayed]

FINDINGS: The lungs are clear. The heart size and pulmonary vascularity are
normal. No adenopathy. No bone lesions.
IMPRESSION: No edema or consolidation.

## 2022-05-15 ENCOUNTER — Emergency Department: Payer: Medicaid Other

## 2022-05-15 ENCOUNTER — Inpatient Hospital Stay
Admission: EM | Admit: 2022-05-15 | Discharge: 2022-05-17 | DRG: 638 | Disposition: A | Discharge status: Home / Self Care | Payer: Medicaid Other | Attending: Family Medicine | Admitting: Family Medicine

## 2022-05-15 ENCOUNTER — Other Ambulatory Visit: Payer: Self-pay

## 2022-05-15 DIAGNOSIS — I160 Hypertensive urgency: Secondary | ICD-10-CM | POA: Diagnosis present

## 2022-05-15 DIAGNOSIS — N179 Acute kidney failure, unspecified: Secondary | ICD-10-CM | POA: Diagnosis present

## 2022-05-15 DIAGNOSIS — Z794 Long term (current) use of insulin: Secondary | ICD-10-CM | POA: Diagnosis not present

## 2022-05-15 DIAGNOSIS — R1110 Vomiting, unspecified: Secondary | ICD-10-CM

## 2022-05-15 DIAGNOSIS — N419 Inflammatory disease of prostate, unspecified: Secondary | ICD-10-CM | POA: Diagnosis present

## 2022-05-15 DIAGNOSIS — F4320 Adjustment disorder, unspecified: Secondary | ICD-10-CM | POA: Diagnosis present

## 2022-05-15 DIAGNOSIS — D631 Anemia in chronic kidney disease: Secondary | ICD-10-CM

## 2022-05-15 DIAGNOSIS — N3000 Acute cystitis without hematuria: Secondary | ICD-10-CM

## 2022-05-15 DIAGNOSIS — F17210 Nicotine dependence, cigarettes, uncomplicated: Secondary | ICD-10-CM | POA: Diagnosis present

## 2022-05-15 DIAGNOSIS — R338 Other retention of urine: Secondary | ICD-10-CM

## 2022-05-15 DIAGNOSIS — E1110 Type 2 diabetes mellitus with ketoacidosis without coma: Secondary | ICD-10-CM | POA: Diagnosis present

## 2022-05-15 DIAGNOSIS — N390 Urinary tract infection, site not specified: Secondary | ICD-10-CM | POA: Insufficient documentation

## 2022-05-15 DIAGNOSIS — E1010 Type 1 diabetes mellitus with ketoacidosis without coma: Principal | ICD-10-CM

## 2022-05-15 DIAGNOSIS — N3289 Other specified disorders of bladder: Secondary | ICD-10-CM | POA: Diagnosis present

## 2022-05-15 DIAGNOSIS — I129 Hypertensive chronic kidney disease with stage 1 through stage 4 chronic kidney disease, or unspecified chronic kidney disease: Secondary | ICD-10-CM | POA: Diagnosis present

## 2022-05-15 DIAGNOSIS — N400 Enlarged prostate without lower urinary tract symptoms: Secondary | ICD-10-CM | POA: Diagnosis present

## 2022-05-15 DIAGNOSIS — N189 Chronic kidney disease, unspecified: Secondary | ICD-10-CM

## 2022-05-15 DIAGNOSIS — N184 Chronic kidney disease, stage 4 (severe): Secondary | ICD-10-CM | POA: Diagnosis present

## 2022-05-15 DIAGNOSIS — E1122 Type 2 diabetes mellitus with diabetic chronic kidney disease: Secondary | ICD-10-CM | POA: Diagnosis present

## 2022-05-15 DIAGNOSIS — R109 Unspecified abdominal pain: Secondary | ICD-10-CM

## 2022-05-15 DIAGNOSIS — N320 Bladder-neck obstruction: Secondary | ICD-10-CM

## 2022-05-15 DIAGNOSIS — F4325 Adjustment disorder with mixed disturbance of emotions and conduct: Secondary | ICD-10-CM | POA: Diagnosis present

## 2022-05-15 LAB — COMPREHENSIVE METABOLIC PANEL
ALT: 48 U/L — ABNORMAL HIGH (ref 0–44)
AST: 27 U/L (ref 15–41)
Albumin: 3.7 g/dL (ref 3.5–5.0)
Alkaline Phosphatase: 141 U/L — ABNORMAL HIGH (ref 38–126)
Anion gap: 17 — ABNORMAL HIGH (ref 5–15)
BUN: 73 mg/dL — ABNORMAL HIGH (ref 6–20)
CO2: 16 mmol/L — ABNORMAL LOW (ref 22–32)
Calcium: 8.7 mg/dL — ABNORMAL LOW (ref 8.9–10.3)
Chloride: 108 mmol/L (ref 98–111)
Creatinine, Ser: 4.34 mg/dL — ABNORMAL HIGH (ref 0.61–1.24)
GFR, Estimated: 18 mL/min — ABNORMAL LOW (ref >60)
Glucose, Bld: 233 mg/dL — ABNORMAL HIGH (ref 70–99)
Potassium: 4.5 mmol/L (ref 3.5–5.1)
Sodium: 141 mmol/L (ref 135–145)
Total Bilirubin: 1.1 mg/dL (ref 0.3–1.2)
Total Protein: 7 g/dL (ref 6.5–8.1)

## 2022-05-15 LAB — BETA-HYDROXYBUTYRIC ACID: Beta-Hydroxybutyric Acid: 3.67 mmol/L — ABNORMAL HIGH (ref 0.05–0.27)

## 2022-05-15 LAB — CBC WITH DIFFERENTIAL/PLATELET
Abs Immature Granulocytes: 0.03 10*3/uL (ref 0.00–0.07)
Basophils Absolute: 0 10*3/uL (ref 0.0–0.1)
Basophils Relative: 0 %
Eosinophils Absolute: 0 10*3/uL (ref 0.0–0.5)
Eosinophils Relative: 0 %
HCT: 27.6 % — ABNORMAL LOW (ref 39.0–52.0)
Hemoglobin: 9.1 g/dL — ABNORMAL LOW (ref 13.0–17.0)
Immature Granulocytes: 0 %
Lymphocytes Relative: 9 %
Lymphs Abs: 0.9 10*3/uL (ref 0.7–4.0)
MCH: 28.6 pg (ref 26.0–34.0)
MCHC: 33 g/dL (ref 30.0–36.0)
MCV: 86.8 fL (ref 80.0–100.0)
Monocytes Absolute: 0.2 10*3/uL (ref 0.1–1.0)
Monocytes Relative: 2 %
Neutro Abs: 8.5 10*3/uL — ABNORMAL HIGH (ref 1.7–7.7)
Neutrophils Relative %: 89 %
Platelets: 274 10*3/uL (ref 150–400)
RBC: 3.18 MIL/uL — ABNORMAL LOW (ref 4.22–5.81)
RDW: 12.5 % (ref 11.5–15.5)
WBC: 9.7 10*3/uL (ref 4.0–10.5)
nRBC: 0 % (ref 0.0–0.2)

## 2022-05-15 LAB — URINALYSIS, ROUTINE W REFLEX MICROSCOPIC
Bilirubin Urine: NEGATIVE
Glucose, UA: >=500 mg/dL — AB
Hgb urine dipstick: NEGATIVE
Ketones, ur: 5 mg/dL — AB
Leukocytes,Ua: NEGATIVE
Nitrite: NEGATIVE
Protein, ur: >=300 mg/dL — AB
Specific Gravity, Urine: 1.013 (ref 1.005–1.030)
pH: 5 (ref 5.0–8.0)

## 2022-05-15 LAB — CBG MONITORING, ED
Glucose-Capillary: 133 mg/dL — ABNORMAL HIGH (ref 70–99)
Glucose-Capillary: 150 mg/dL — ABNORMAL HIGH (ref 70–99)
Glucose-Capillary: 158 mg/dL — ABNORMAL HIGH (ref 70–99)
Glucose-Capillary: 160 mg/dL — ABNORMAL HIGH (ref 70–99)
Glucose-Capillary: 172 mg/dL — ABNORMAL HIGH (ref 70–99)
Glucose-Capillary: 176 mg/dL — ABNORMAL HIGH (ref 70–99)
Glucose-Capillary: 201 mg/dL — ABNORMAL HIGH (ref 70–99)

## 2022-05-15 LAB — BLOOD GAS, VENOUS
Acid-base deficit: 2.4 mmol/L — ABNORMAL HIGH (ref 0.0–2.0)
Bicarbonate: 21.6 mmol/L (ref 20.0–28.0)
O2 Saturation: 82.1 %
Patient temperature: 37
pCO2, Ven: 34 mmHg — ABNORMAL LOW (ref 44–60)
pH, Ven: 7.41 (ref 7.25–7.43)
pO2, Ven: 53 mmHg — ABNORMAL HIGH (ref 32–45)

## 2022-05-15 LAB — LIPASE, BLOOD: Lipase: 31 U/L (ref 11–51)

## 2022-05-15 MED ORDER — ACETAMINOPHEN 650 MG RE SUPP: 650.0000 mg | Freq: Four times a day (QID) | RECTAL | Status: DC | PRN

## 2022-05-15 MED ORDER — ONDANSETRON HCL 4 MG/2ML IJ SOLN
4.0000 mg | Freq: Once | INTRAMUSCULAR | Status: AC
  Administered 2022-05-15: 4 mg via INTRAVENOUS
  Filled 2022-05-15: qty 2

## 2022-05-15 MED ORDER — AMLODIPINE BESYLATE 10 MG PO TABS
10.0000 mg | ORAL_TABLET | Freq: Every day | ORAL | Status: DC
  Administered 2022-05-16: 10 mg via ORAL
  Filled 2022-05-15 (×2): qty 2
  Filled 2022-05-15: qty 1

## 2022-05-15 MED ORDER — SODIUM CHLORIDE 0.9 % IV BOLUS
1000.0000 mL | Freq: Once | INTRAVENOUS | Status: AC
  Administered 2022-05-15: 1000 mL via INTRAVENOUS

## 2022-05-15 MED ORDER — MORPHINE SULFATE (PF) 4 MG/ML IV SOLN
4.0000 mg | INTRAVENOUS | Status: DC | PRN
  Administered 2022-05-15: 4 mg via INTRAVENOUS
  Filled 2022-05-15: qty 1

## 2022-05-15 MED ORDER — DEXTROSE IN LACTATED RINGERS 5 % IV SOLN: INTRAVENOUS | Status: DC

## 2022-05-15 MED ORDER — POTASSIUM CHLORIDE 10 MEQ/100ML IV SOLN
10.0000 meq | INTRAVENOUS | Status: AC
  Administered 2022-05-15 (×2): 10 meq via INTRAVENOUS
  Filled 2022-05-15 (×2): qty 100

## 2022-05-15 MED ORDER — LACTATED RINGERS IV BOLUS
1500.0000 mL | Freq: Once | INTRAVENOUS | Status: AC
  Administered 2022-05-15: 1500 mL via INTRAVENOUS

## 2022-05-15 MED ORDER — LACTATED RINGERS IV SOLN: INTRAVENOUS | Status: DC

## 2022-05-15 MED ORDER — HYDRALAZINE HCL 20 MG/ML IJ SOLN: 5.0000 mg | Freq: Four times a day (QID) | INTRAMUSCULAR | Status: DC | PRN

## 2022-05-15 MED ORDER — SODIUM CHLORIDE 0.9 % IV SOLN
1.0000 g | Freq: Once | INTRAVENOUS | Status: AC
  Administered 2022-05-15: 1 g via INTRAVENOUS
  Filled 2022-05-15: qty 10

## 2022-05-15 MED ORDER — SODIUM CHLORIDE 0.9 % IV SOLN
12.5000 mg | Freq: Four times a day (QID) | INTRAVENOUS | Status: DC | PRN
  Administered 2022-05-16: 12.5 mg via INTRAVENOUS
  Filled 2022-05-15: qty 12.5

## 2022-05-15 MED ORDER — HYDROCODONE-ACETAMINOPHEN 5-325 MG PO TABS: 1.0000 | ORAL_TABLET | ORAL | Status: DC | PRN

## 2022-05-15 MED ORDER — ONDANSETRON HCL 4 MG/2ML IJ SOLN
4.0000 mg | Freq: Four times a day (QID) | INTRAMUSCULAR | Status: DC | PRN
  Administered 2022-05-15: 4 mg via INTRAVENOUS
  Filled 2022-05-15: qty 2

## 2022-05-15 MED ORDER — CARVEDILOL 25 MG PO TABS
25.0000 mg | ORAL_TABLET | Freq: Two times a day (BID) | ORAL | Status: DC
  Administered 2022-05-16 (×2): 25 mg via ORAL
  Filled 2022-05-15 (×3): qty 1

## 2022-05-15 MED ORDER — ENOXAPARIN SODIUM 30 MG/0.3ML IJ SOSY
30.0000 mg | PREFILLED_SYRINGE | INTRAMUSCULAR | Status: DC
  Administered 2022-05-15 – 2022-05-16 (×2): 30 mg via SUBCUTANEOUS
  Filled 2022-05-15 (×2): qty 0.3

## 2022-05-15 MED ORDER — ONDANSETRON HCL 4 MG PO TABS: 4.0000 mg | ORAL_TABLET | Freq: Four times a day (QID) | ORAL | Status: DC | PRN

## 2022-05-15 MED ORDER — ACETAMINOPHEN 325 MG PO TABS: 650.0000 mg | ORAL_TABLET | Freq: Four times a day (QID) | ORAL | Status: DC | PRN

## 2022-05-15 MED ORDER — INSULIN REGULAR(HUMAN) IN NACL 100-0.9 UT/100ML-% IV SOLN
INTRAVENOUS | Status: DC
  Administered 2022-05-15: 3 [IU]/h via INTRAVENOUS
  Filled 2022-05-15: qty 100

## 2022-05-15 MED ORDER — MORPHINE SULFATE (PF) 2 MG/ML IV SOLN: 2.0000 mg | INTRAVENOUS | Status: DC | PRN

## 2022-05-15 MED ORDER — TAMSULOSIN HCL 0.4 MG PO CAPS
0.4000 mg | ORAL_CAPSULE | Freq: Every day | ORAL | Status: DC
  Administered 2022-05-16: 0.4 mg via ORAL
  Filled 2022-05-15 (×3): qty 1

## 2022-05-15 MED ORDER — DEXTROSE 50 % IV SOLN: 0.0000 mL | INTRAVENOUS | Status: DC | PRN

## 2022-05-15 MED ORDER — MIRTAZAPINE 15 MG PO TABS
15.0000 mg | ORAL_TABLET | Freq: Every day | ORAL | Status: DC
  Administered 2022-05-16: 15 mg via ORAL
  Filled 2022-05-15 (×2): qty 1

## 2022-05-15 NOTE — Assessment & Plan Note (Addendum)
Noted on CT initially.  Did receive Foley cath which was discontinued patient was voiding without difficulty prior to discharge.  He was started on Flomax is to be continued postdischarge.

## 2022-05-15 NOTE — ED Notes (Signed)
MD notified that pt continues to have n/v and is unable to take ordered meds PO. RN awaiting response.

## 2022-05-15 NOTE — Assessment & Plan Note (Addendum)
Resume his antihypertensives

## 2022-05-15 NOTE — ED Provider Notes (Signed)
[facility] Provider Note    Event Date/Time   First MD Initiated Contact with Patient 05/15/22 1719     (approximate)   History   Emesis   HPI  Darrell Walls is a 31 y.o. male with a history of type 1 diabetes presents to the ER for evaluation of nausea vomiting over the past 24 hours.  Endorses generalized abdominal pain.  Has had decreased urine output.  Does smoke marijuana.  Does have history of pyelonephritis.  No recent antibiotic use.  Denies any chest pain or shortness of breath.     Physical Exam   Triage Vital Signs: ED Triage Vitals [05/15/22 1241]  Enc Vitals Group     BP (!) 165/113     Pulse Rate (!) 106     Resp 16     Temp 98.1 F (36.7 C)     Temp Source Oral     SpO2 100 %     Weight      Height      Head Circumference      Peak Flow      Pain Score 9     Pain Loc      Pain Edu?      Excl. in [place]?     Most recent vital signs: Vitals:   05/15/22 2000 05/15/22 2030  BP: (!) 179/97 (!) 182/98  Pulse: (!) 103 (!) 102  Resp: 14 14  Temp:    SpO2: 100% 100%     Constitutional: Alert  Eyes: Conjunctivae are normal.  Head: Atraumatic. Nose: No congestion/rhinnorhea. Mouth/Throat: Mucous membranes are moist.   Neck: Painless ROM.  Cardiovascular:   Good peripheral circulation. Respiratory: Normal respiratory effort.  No retractions.  Gastrointestinal: Soft and nontender.  Musculoskeletal:  no deformity Neurologic:  MAE spontaneously. No gross focal neurologic deficits are appreciated.  Skin:  Skin is warm, dry and intact. No rash noted. Psychiatric: Mood and affect are normal. Speech and behavior are normal.    ED Results / Procedures / Treatments   Labs (all labs ordered are listed, but only abnormal results are displayed) Labs Reviewed  COMPREHENSIVE METABOLIC PANEL - Abnormal; Notable for the following components:      Result Value   CO2 16 (*)    Glucose, Bld 233 (*)    BUN 73 (*)    Creatinine, Ser  4.34 (*)    Calcium 8.7 (*)    ALT 48 (*)    Alkaline Phosphatase 141 (*)    GFR, Estimated 18 (*)    Anion gap 17 (*)    All other components within normal limits  CBC WITH DIFFERENTIAL/PLATELET - Abnormal; Notable for the following components:   RBC 3.18 (*)    Hemoglobin 9.1 (*)    HCT 27.6 (*)    Neutro Abs 8.5 (*)    All other components within normal limits  URINALYSIS, ROUTINE W REFLEX MICROSCOPIC - Abnormal; Notable for the following components:   Color, Urine YELLOW (*)    APPearance HAZY (*)    Glucose, UA >=500 (*)    Ketones, ur 5 (*)    Protein, ur >=300 (*)    Bacteria, UA MANY (*)    All other components within normal limits  BETA-HYDROXYBUTYRIC ACID - Abnormal; Notable for the following components:   Beta-Hydroxybutyric Acid 3.67 (*)    All other components within normal limits  BLOOD GAS, VENOUS - Abnormal; Notable for the following components:   pCO2, Ven 34 (*)  pO2, Ven 53 (*)    Acid-base deficit 2.4 (*)    All other components within normal limits  CBG MONITORING, ED - Abnormal; Notable for the following components:   Glucose-Capillary 176 (*)    All other components within normal limits  CBG MONITORING, ED - Abnormal; Notable for the following components:   Glucose-Capillary 201 (*)    All other components within normal limits  CBG MONITORING, ED - Abnormal; Notable for the following components:   Glucose-Capillary 158 (*)    All other components within normal limits  URINE CULTURE  LIPASE, BLOOD  BASIC METABOLIC PANEL  BASIC METABOLIC PANEL  BASIC METABOLIC PANEL     EKG  ED ECG REPORT I, Merlyn Lot, the attending physician, personally viewed and interpreted this ECG.   Date: 05/15/2022  EKG Time: 12:45  Rate: 110  Rhythm: sinus  Axis: normal  Intervals:normal  ST&T Change: no stemi, no depressions    RADIOLOGY Please see ED Course for my review and interpretation.  I personally reviewed all radiographic images ordered to  evaluate for the above acute complaints and reviewed radiology reports and findings.  These findings were personally discussed with the patient.  Please see medical record for radiology report.    PROCEDURES:  Critical Care performed: Yes, see critical care procedure note(s)  .Critical Care  Performed by: Merlyn Lot, MD Authorized by: Merlyn Lot, MD   Critical care provider statement:    Critical care time (minutes):  40   Critical care was necessary to treat or prevent imminent or life-threatening deterioration of the following conditions:  Endocrine crisis   Critical care was time spent personally by me on the following activities:  Ordering and performing treatments and interventions, ordering and review of laboratory studies, ordering and review of radiographic studies, pulse oximetry, re-evaluation of patient's condition, review of old charts, obtaining history from patient or surrogate, examination of patient, evaluation of patient's response to treatment, discussions with primary provider, discussions with consultants and development of treatment plan with patient or surrogate    MEDICATIONS ORDERED IN ED: Medications  insulin regular, human (MYXREDLIN) 100 units/ 100 mL infusion (1 Units/hr Intravenous Rate/Dose Change 05/15/22 2031)  lactated ringers infusion ( Intravenous Not Given 05/15/22 1832)  dextrose 5 % in lactated ringers infusion ( Intravenous New Bag/Given 05/15/22 1831)  dextrose 50 % solution 0-50 mL (has no administration in time range)  morphine (PF) 4 MG/ML injection 4 mg (4 mg Intravenous Given 05/15/22 1805)  cefTRIAXone (ROCEPHIN) 1 g in sodium chloride 0.9 % 100 mL IVPB (has no administration in time range)  sodium chloride 0.9 % bolus 1,000 mL (0 mLs Intravenous Stopped 05/15/22 1719)  lactated ringers bolus 1,500 mL (0 mLs Intravenous Stopped 05/15/22 1957)  potassium chloride 10 mEq in 100 mL IVPB (0 mEq Intravenous Stopped 05/15/22 2033)   ondansetron (ZOFRAN) injection 4 mg (4 mg Intravenous Given 05/15/22 1803)     IMPRESSION / MDM / [place] / ED COURSE  I reviewed the triage vital signs and the nursing notes.                              Differential diagnosis includes, but is not limited to, DKA, HHS, dehydration, AKI, sepsis, pyelonephritis, enteritis, colitis,  Patient presenting to the ER for evaluation of symptoms as described above.  Based on symptoms, risk factors and considered above differential, this presenting complaint could reflect a potentially life-threatening  illness therefore the patient will be placed on continuous pulse oximetry and telemetry for monitoring.  Laboratory evaluation will be sent to evaluate for the above complaints.  IV fluids ordered.  Bladder does show evidence of AKI worsening renal function.  Mild hyperglycemia but has metabolic high anion gap acidosis contrast concerning for DKA.  Beta hydroxybutyrate is also elevated.  Will give IV fluids for resuscitation.  Will place on insulin drip protocol.  Will check urine will order CT imaging as he is having abdominal pain.   Clinical Course as of 05/15/22 2035  Wed May 15, 2022  1852 Discussed CT imaging with patient.  States he will try to use the bathroom.  Will do a postvoid residual may require Foley catheter. [PR]  1945 Patient was able to void 1 L but had 400 mL residual.  No other neurodeficits.  Recommended foley catheterization for bladder decompression as I am concerned that a large component of his renal insufficiency could be secondary to bladder outlet obstruction.  Patient is refusing catheter insertion. [PR]  2009 Urinalysis consistent with UTI suspecting prostatitis given enlargement on imaging.  Will order IV Rocephin.  Will consult hospitalist for admission.  Remains hemodynamically stable. [PR]    Clinical Course User Index [PR] Merlyn Lot, MD    FINAL CLINICAL IMPRESSION(S) / ED DIAGNOSES   Final  diagnoses:  Type 1 diabetes mellitus with ketoacidosis without coma ([place])  Acute cystitis without hematuria     Rx / DC Orders   ED Discharge Orders     None        Note:  This document was prepared using Dragon voice recognition software and may include unintentional dictation errors.    Merlyn Lot, MD 05/15/22 2035

## 2022-05-15 NOTE — ED Provider Triage Note (Signed)
  Emergency Medicine Provider Triage Evaluation Note  Sherrel Shafer , a 31 y.o.male,  was evaluated in triage.  Pt complains of emesis.  Patient states that he has had over 10 episodes of vomiting over the past 24 hours.  Reports some blood in his vomit as well.  Endorses generalized abdominal cramping as well.  Joined by a family member, who states that he has a history of kidney disease as well.  He has presented like this in the past with kidney infections.  He does additionally endorse daily cannabis use and has been doing so for the past several years.   Review of Systems  Positive: Emesis, abdominal pain Negative: Denies fever, chest pain, vomiting  Physical Exam   Vitals:   05/15/22 1241  BP: (!) 165/113  Pulse: (!) 106  Resp: 16  Temp: 98.1 F (36.7 C)  SpO2: 100%   Gen:   Awake, no distress   Resp:  Normal effort  MSK:   Moves extremities without difficulty  Other:    Medical Decision Making  Given the patient's initial medical screening exam, the following diagnostic evaluation has been ordered. The patient will be placed in the appropriate treatment space, once one is available, to complete the evaluation and treatment. I have discussed the plan of care with the patient and I have advised the patient that an ED physician or mid-level practitioner will reevaluate their condition after the test results have been received, as the results may give them additional insight into the type of treatment they may need.    Diagnostics: Labs, EKG, UA, CBG  Treatments: none immediately   Teodoro Spray, [place] 05/15/22 1251

## 2022-05-15 NOTE — ED Notes (Signed)
Pt refusing foley cath at this time. Pt verbalized understanding as to why he needs foley cath and positive outcome it can have on his kidney function.

## 2022-05-15 NOTE — ED Notes (Signed)
Patient continues with n/v and is unable to take ordered meds PO. RN alerting MD

## 2022-05-15 NOTE — Assessment & Plan Note (Addendum)
Vomiting, nonbloody nonbilious Likely related to DKA and some possible issue with urinary retention.

## 2022-05-15 NOTE — ED Notes (Signed)
Pt given urine cup to collect sample when able.

## 2022-05-15 NOTE — Assessment & Plan Note (Addendum)
Cystitis versus prostatitis Given Rocephin x 1 Follow cultures are negative, given 5 days of cephalosporin as this may have been the cause of DKA in the first place.

## 2022-05-15 NOTE — H&P (Signed)
History and Physical    Patient: Darrell Walls HBZ:[id number] DOB: 10-27-1990 DOA: 05/15/2022 DOS: the patient was seen and examined on 05/15/2022 PCP: System, Provider Not In  Patient coming from: Home  Chief Complaint:  Chief Complaint  Patient presents with   Emesis    HPI: Darrell Walls is a 31 y.o. 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  He denies change in bowel habits, fever or chills, dysuria, chest pain or shortness of breath. ED course and data review: Afebrile, BP 165/113 with pulse 106 and O2 sat of 100% on room air.  CBC WNL with hemoglobin at baseline at 9.1.  Lipase and LFTs for the most part normal.  Creatinine 4.34 up from baseline of 3.51 with a bicarb of 16, anion gap of 17 and blood glucose 233.  Beta hydroxybutyric acid 3.67.  Urinalysis with many bacteria and negative nitrite and leukocyte esterase. EKG, personally reviewed and interpreted showing sinus tachycardia at 109 with no ischemic ST-T wave changes. CT abdomen and pelvis without contrast negative for obstruction but showing marked urinary bladder distention as further detailed below: IMPRESSION: There is no evidence of intestinal obstruction or pneumoperitoneum. Appendix is not dilated. There is no hydronephrosis. There is marked distention of urinary bladder. Please correlate for possible bladder outlet obstruction. Prostate appears to be enlarged. Small pericardial effusion is present.  Patient was able to void 1 L but continued to have a 400 mL residual by bladder scan and refused Foley catheter insertion for bladder decompression. Patient was started on an insulin drip for DKA He was also started on Rocephin for possible UTI/prostatitis given  enlargement on imaging Hospitalist consulted for admission.   Review of Systems: As mentioned in the history of present illness. All other systems reviewed and are negative.  Past Medical History:  Diagnosis Date   Diabetes mellitus without complication ([place])    Past Surgical History:  Procedure Laterality Date   EYE SURGERY     TONSILLECTOMY     Social History:  reports that he has been smoking cigarettes. He has never used smokeless tobacco. He reports that he does not currently use alcohol. He reports current drug use. Drug: Marijuana.  No Known Allergies  Family History  Problem Relation Age of Onset   Healthy Mother     Prior to Admission medications   Medication Sig Start Date End Date Taking? Authorizing Provider  insulin glargine (LANTUS) 100 UNIT/ML injection Inject 0.1 mLs (10 Units total) into the skin daily. 09/23/19   Lavonia Drafts, MD  Insulin Pen Needle 29G X 5MM MISC Please dispense 1 box of 100 insulin needles and syringes 09/23/19   Lavonia Drafts, MD    Physical Exam: Vitals:   05/15/22 1622 05/15/22 1801 05/15/22 2000 05/15/22 2030  BP: 134/76 (!) 149/89 (!) 179/97 (!) 182/98  Pulse: (!) 107 (!) 109 (!) 103 (!) 102  Resp: 20 18 14 14   Temp: 98.5 F (36.9 C) 99 F (37.2 C)    TempSrc: Oral Oral    SpO2: 100% 100% 100% 100%   Physical Exam Vitals and nursing note reviewed.  Constitutional:      General: He is not in acute distress.    Appearance: He is ill-appearing.     Comments: Actively vomiting  HENT:  Head: Normocephalic and atraumatic.  Cardiovascular:     Rate and Rhythm: Regular rhythm. Tachycardia present.     Heart sounds: Normal heart sounds.  Pulmonary:     Effort: Pulmonary effort is normal.     Breath sounds: Normal breath sounds.  Abdominal:     Palpations: Abdomen is soft.     Tenderness: There is no abdominal tenderness.  Neurological:     Mental Status: Mental status is at baseline. He is lethargic.     Labs on  Admission: I have personally reviewed following labs and imaging studies  CBC: Recent Labs  Lab 05/15/22 1246  WBC 9.7  NEUTROABS 8.5*  HGB 9.1*  HCT 27.6*  MCV 86.8  PLT 009   Basic Metabolic Panel: Recent Labs  Lab 05/15/22 1246  NA 141  K 4.5  CL 108  CO2 16*  GLUCOSE 233*  BUN 73*  CREATININE 4.34*  CALCIUM 8.7*   GFR: CrCl cannot be calculated (Unknown ideal weight.). Liver Function Tests: Recent Labs  Lab 05/15/22 1246  AST 27  ALT 48*  ALKPHOS 141*  BILITOT 1.1  PROT 7.0  ALBUMIN 3.7   Recent Labs  Lab 05/15/22 1246  LIPASE 31   No results for input(s): "AMMONIA" in the last 168 hours. Coagulation Profile: No results for input(s): "INR", "PROTIME" in the last 168 hours. Cardiac Enzymes: No results for input(s): "CKTOTAL", "CKMB", "CKMBINDEX", "TROPONINI" in the last 168 hours. BNP (last 3 results) No results for input(s): "PROBNP" in the last 8760 hours. HbA1C: No results for input(s): "HGBA1C" in the last 72 hours. CBG: Recent Labs  Lab 05/15/22 1625 05/15/22 1815 05/15/22 1924  GLUCAP 176* 201* 158*   Lipid Profile: No results for input(s): "CHOL", "HDL", "LDLCALC", "TRIG", "CHOLHDL", "LDLDIRECT" in the last 72 hours. Thyroid Function Tests: No results for input(s): "TSH", "T4TOTAL", "FREET4", "T3FREE", "THYROIDAB" in the last 72 hours. Anemia Panel: No results for input(s): "VITAMINB12", "FOLATE", "FERRITIN", "TIBC", "IRON", "RETICCTPCT" in the last 72 hours. Urine analysis:    Component Value Date/Time   COLORURINE YELLOW (A) 05/15/2022 1948   APPEARANCEUR HAZY (A) 05/15/2022 1948   LABSPEC 1.013 05/15/2022 1948   PHURINE 5.0 05/15/2022 1948   GLUCOSEU >=500 (A) 05/15/2022 1948   HGBUR NEGATIVE 05/15/2022 [place] NEGATIVE 05/15/2022 1948   KETONESUR 5 (A) 05/15/2022 1948   PROTEINUR >=300 (A) 05/15/2022 1948   NITRITE NEGATIVE 05/15/2022 1948   LEUKOCYTESUR NEGATIVE 05/15/2022 1948    Radiological Exams on  Admission: CT ABDOMEN PELVIS WO CONTRAST  Result Date: 05/15/2022 CLINICAL DATA:  Abdominal pain, vomiting EXAM: CT ABDOMEN AND PELVIS WITHOUT CONTRAST TECHNIQUE: Multidetector CT imaging of the abdomen and pelvis was performed following the standard protocol without IV contrast. RADIATION DOSE REDUCTION: This exam was performed according to the departmental dose-optimization program which includes automated exposure control, adjustment of the mA and/or kV according to patient size and/or use of iterative reconstruction technique. COMPARISON:  None Available. FINDINGS: Lower chest: Small pericardial effusion is present. Visualized lower lung fields are unremarkable. Hepatobiliary: No focal abnormalities are seen in liver. There is no dilation of bile ducts. Gallbladder is unremarkable. Pancreas: No focal abnormalities are seen. Spleen: Unremarkable. Adrenals/Urinary Tract: Adrenals are unremarkable. There is no hydronephrosis. There is no perinephric fluid collection. There are no renal or ureteral stones. Urinary bladder is distended measuring 17.7 cm in maximum diameter. There is no wall thickening in the bladder. Stomach/Bowel: Stomach is unremarkable. Small bowel loops are not dilated. Appendix is not  dilated. There is no significant wall thickening in colon. There is no pericolic stranding. Vascular/Lymphatic: Few small scattered arterial calcifications are seen. Reproductive: Prostate is enlarged. Other: There is no ascites or pneumoperitoneum. Musculoskeletal: No acute findings are seen. Degenerative changes with small bony spurs are noted in right hip. IMPRESSION: There is no evidence of intestinal obstruction or pneumoperitoneum. Appendix is not dilated. There is no hydronephrosis. There is marked distention of urinary bladder. Please correlate for possible bladder outlet obstruction. Prostate appears to be enlarged. Small pericardial effusion is present. Electronically Signed   By: Elmer Picker  M.D.   On: 05/15/2022 18:08     Data Reviewed: Relevant notes from primary care and specialist visits, past discharge summaries as available in EHR, including Care Everywhere. Prior diagnostic testing as pertinent to current admission diagnoses Updated medications and problem lists for reconciliation ED course, including vitals, labs, imaging, treatment and response to treatment Triage notes, nursing and pharmacy notes and ED provider's notes Notable results as noted in HPI   Assessment and Plan: DKA, type 2 ([place]) Blood sugar 233 with anion gap of 17, bicarb 16 and potassium of 5 Continue insulin infusion per Endo tool and transition to subcu as gap closes and patient becomes less acidotic Continue IV fluids, potassium repletion per Endo tool   Abdominal pain Vomiting, nonbloody nonbilious Suspect secondary to DKA or possible UTI/prostatitis CT abdomen and pelvis negative for obstruction.  Shows prostatomegaly and urinary retention.  Patient has declined Foley catheter IV antiemetics and supportive care Acute urinary tract infection as separately outlined  Urinary tract infection Cystitis versus prostatitis Will continue Rocephin started in the ED Continue Rocephin Follow cultures  Acute urinary retention Distended bladder on CT abdomen and pelvis with postvoid schedule of 400 mL.  No hydronephrosis Patient declined/refused Foley catheter for bladder decompression Consider urology consult Will place on Flomax  Acute renal failure superimposed on stage 4 chronic kidney disease ([place]) Likely with both prerenal and post renal components related to vomiting and urinary retention respectively IV hydration Monitor renal function and avoid nephrotoxins (hold home telmisartan, chlorthalidone and gabapentin) Patient declining Foley for bladder decompression See management under separately listed problems Consider renal and urology consults in the a.m. if no improvement Patient  follows with [facility] nephrology  Anemia of chronic kidney failure, stage 4 (severe) (HCC) Hemoglobin 9.1, improved from baseline of 8.5 in October on review of records in [place]  Hypertensive urgency BP 165/113 on arrival which could in part be related to pain Continue amlodipine 10 daily and carvedilol 25 twice daily if able to tolerate.  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 Inpatient status is judged to be reasonable and necessary in order to provide the required intensity of service to ensure the patient's safety. The patient's presenting symptoms, physical exam findings, and initial radiographic and laboratory data in the context of their chronic comorbidities is felt to place them at high risk for further clinical deterioration. Furthermore, it is not anticipated that the patient will be medically stable for discharge from the hospital within 2 midnights of admission.   * I  certify that at the point of admission it is my clinical judgment that the patient will require inpatient hospital care spanning beyond 2 midnights from the point of admission due to high intensity of service, high risk for further deterioration and high frequency of surveillance required.*  Author: Athena Masse, MD 05/15/2022 8:38 PM  For on call review [email or web address].

## 2022-05-15 NOTE — Assessment & Plan Note (Addendum)
Patient received IV insulin and insulin drip prior to per protocol. As his acidemia was corrected he was given long-acting insulin and resumed on short acting sliding scale insulin.  His CBGs were well-controlled throughout his hospitalization.

## 2022-05-15 NOTE — ED Triage Notes (Signed)
Pt to ED via [facility] from home for emesis. Pt states that he is also having abdominal pain, chest pain, and dizziness. Pt has hx/o kidney disease.

## 2022-05-15 NOTE — Assessment & Plan Note (Addendum)
Hemoglobin stable at 8.4

## 2022-05-15 NOTE — Assessment & Plan Note (Addendum)
Likely with both prerenal and post renal components related to vomiting and urinary retention respectively Improved with correction of his acidemia and IV fluid hydration. Serum creatinine is back to baseline at discharge. Patient follows with [facility] nephrology

## 2022-05-15 NOTE — ED Notes (Signed)
Per bladder scanner pt has 420ml residual after a 1017ml output.

## 2022-05-15 NOTE — Assessment & Plan Note (Addendum)
Continue Remeron 15 nightly

## 2022-05-15 NOTE — ED Triage Notes (Signed)
Pt comes via [facility] from home with c/o belly pain, vomiting up blood. Pt was hyperglycemic and dx with kidney function issues.   BP-200/100 HR-110-120 20-25 RR CBG-271  700 of fluids given. 4 of zofran. 20 lac and 18 rac

## 2022-05-15 NOTE — ED Notes (Signed)
Pt in bed, family at bedside, pt states that he has been vomiting today and is now vomiting some dark red blood, pt reports some nausea.

## 2022-05-16 ENCOUNTER — Encounter: Payer: Self-pay | Admitting: Internal Medicine

## 2022-05-16 DIAGNOSIS — E1110 Type 2 diabetes mellitus with ketoacidosis without coma: Secondary | ICD-10-CM | POA: Diagnosis not present

## 2022-05-16 DIAGNOSIS — E1010 Type 1 diabetes mellitus with ketoacidosis without coma: Secondary | ICD-10-CM | POA: Diagnosis not present

## 2022-05-16 DIAGNOSIS — E8729 Other acidosis: Secondary | ICD-10-CM | POA: Insufficient documentation

## 2022-05-16 LAB — CBG MONITORING, ED
Glucose-Capillary: 128 mg/dL — ABNORMAL HIGH (ref 70–99)
Glucose-Capillary: 130 mg/dL — ABNORMAL HIGH (ref 70–99)
Glucose-Capillary: 149 mg/dL — ABNORMAL HIGH (ref 70–99)
Glucose-Capillary: 154 mg/dL — ABNORMAL HIGH (ref 70–99)
Glucose-Capillary: 157 mg/dL — ABNORMAL HIGH (ref 70–99)
Glucose-Capillary: 158 mg/dL — ABNORMAL HIGH (ref 70–99)
Glucose-Capillary: 160 mg/dL — ABNORMAL HIGH (ref 70–99)
Glucose-Capillary: 165 mg/dL — ABNORMAL HIGH (ref 70–99)
Glucose-Capillary: 85 mg/dL (ref 70–99)
Glucose-Capillary: 93 mg/dL (ref 70–99)
Glucose-Capillary: 95 mg/dL (ref 70–99)
Glucose-Capillary: 97 mg/dL (ref 70–99)

## 2022-05-16 LAB — CBC
HCT: 24.3 % — ABNORMAL LOW (ref 39.0–52.0)
Hemoglobin: 8.1 g/dL — ABNORMAL LOW (ref 13.0–17.0)
MCH: 28.8 pg (ref 26.0–34.0)
MCHC: 33.3 g/dL (ref 30.0–36.0)
MCV: 86.5 fL (ref 80.0–100.0)
Platelets: 244 K/uL (ref 150–400)
RBC: 2.81 MIL/uL — ABNORMAL LOW (ref 4.22–5.81)
RDW: 12.8 % (ref 11.5–15.5)
WBC: 13.6 K/uL — ABNORMAL HIGH (ref 4.0–10.5)
nRBC: 0 % (ref 0.0–0.2)

## 2022-05-16 LAB — BASIC METABOLIC PANEL
Anion gap: 8 (ref 5–15)
BUN: 68 mg/dL — ABNORMAL HIGH (ref 6–20)
CO2: 24 mmol/L (ref 22–32)
Calcium: 8.4 mg/dL — ABNORMAL LOW (ref 8.9–10.3)
Chloride: 112 mmol/L — ABNORMAL HIGH (ref 98–111)
Creatinine, Ser: 4 mg/dL — ABNORMAL HIGH (ref 0.61–1.24)
GFR, Estimated: 20 mL/min — ABNORMAL LOW (ref >60)
Glucose, Bld: 176 mg/dL — ABNORMAL HIGH (ref 70–99)
Potassium: 3.9 mmol/L (ref 3.5–5.1)
Sodium: 144 mmol/L (ref 135–145)

## 2022-05-16 LAB — BASIC METABOLIC PANEL WITH GFR
Anion gap: 8 (ref 5–15)
BUN: 65 mg/dL — ABNORMAL HIGH (ref 6–20)
CO2: 24 mmol/L (ref 22–32)
Calcium: 8.6 mg/dL — ABNORMAL LOW (ref 8.9–10.3)
Chloride: 113 mmol/L — ABNORMAL HIGH (ref 98–111)
Creatinine, Ser: 3.86 mg/dL — ABNORMAL HIGH (ref 0.61–1.24)
GFR, Estimated: 21 mL/min — ABNORMAL LOW
Glucose, Bld: 179 mg/dL — ABNORMAL HIGH (ref 70–99)
Potassium: 4 mmol/L (ref 3.5–5.1)
Sodium: 145 mmol/L (ref 135–145)

## 2022-05-16 LAB — URINALYSIS, ROUTINE W REFLEX MICROSCOPIC
Bilirubin Urine: NEGATIVE
Glucose, UA: >=500 mg/dL — AB
Ketones, ur: NEGATIVE mg/dL
Leukocytes,Ua: NEGATIVE
Nitrite: NEGATIVE
Protein, ur: >=300 mg/dL — AB
Specific Gravity, Urine: 1.012 (ref 1.005–1.030)
pH: 5 (ref 5.0–8.0)

## 2022-05-16 LAB — GLUCOSE, CAPILLARY
Glucose-Capillary: 88 mg/dL (ref 70–99)
Glucose-Capillary: 93 mg/dL (ref 70–99)

## 2022-05-16 LAB — HEMOGLOBIN A1C
Hgb A1c MFr Bld: 8.8 % — ABNORMAL HIGH (ref 4.8–5.6)
Mean Plasma Glucose: 206 mg/dL

## 2022-05-16 LAB — HIV ANTIBODY (ROUTINE TESTING W REFLEX): HIV Screen 4th Generation wRfx: NONREACTIVE

## 2022-05-16 MED ORDER — INSULIN ASPART 100 UNIT/ML IJ SOLN: 0.0000 [IU] | Freq: Three times a day (TID) | INTRAMUSCULAR | Status: DC

## 2022-05-16 MED ORDER — SODIUM CHLORIDE 0.9 % IV SOLN
1.0000 g | INTRAVENOUS | Status: DC
  Administered 2022-05-16: 1 g via INTRAVENOUS
  Filled 2022-05-16 (×2): qty 10

## 2022-05-16 MED ORDER — LIDOCAINE HCL URETHRAL/MUCOSAL 2 % EX GEL
1.0000 | Freq: Once | CUTANEOUS | Status: AC
  Administered 2022-05-16: 1 via URETHRAL
  Filled 2022-05-16 (×2): qty 10

## 2022-05-16 MED ORDER — HYDROMORPHONE HCL 1 MG/ML IJ SOLN
0.5000 mg | Freq: Once | INTRAMUSCULAR | Status: AC
  Administered 2022-05-16: 0.5 mg via INTRAVENOUS
  Filled 2022-05-16: qty 0.5

## 2022-05-16 MED ORDER — INSULIN GLARGINE-YFGN 100 UNIT/ML ~~LOC~~ SOLN
12.0000 [IU] | Freq: Every day | SUBCUTANEOUS | Status: DC
  Filled 2022-05-16: qty 0.12

## 2022-05-16 MED ORDER — LACTATED RINGERS IV SOLN: INTRAVENOUS | Status: DC

## 2022-05-16 MED ORDER — METOCLOPRAMIDE HCL 5 MG/ML IJ SOLN
10.0000 mg | Freq: Four times a day (QID) | INTRAMUSCULAR | Status: DC
  Administered 2022-05-16 – 2022-05-17 (×4): 10 mg via INTRAVENOUS
  Filled 2022-05-16 (×4): qty 2

## 2022-05-16 MED ORDER — INSULIN GLARGINE-YFGN 100 UNIT/ML ~~LOC~~ SOLN
50.0000 [IU] | SUBCUTANEOUS | Status: AC
  Administered 2022-05-16: 50 [IU] via SUBCUTANEOUS
  Filled 2022-05-16: qty 0.5

## 2022-05-16 MED ORDER — METOCLOPRAMIDE HCL 5 MG/ML IJ SOLN
10.0000 mg | Freq: Four times a day (QID) | INTRAMUSCULAR | Status: DC
  Filled 2022-05-16: qty 2

## 2022-05-16 MED ORDER — INSULIN ASPART 100 UNIT/ML IJ SOLN
0.0000 [IU] | Freq: Three times a day (TID) | INTRAMUSCULAR | Status: DC
  Administered 2022-05-16: 2 [IU] via SUBCUTANEOUS
  Administered 2022-05-16: 1 [IU] via SUBCUTANEOUS
  Filled 2022-05-16 (×2): qty 1

## 2022-05-16 MED ORDER — LORAZEPAM 2 MG/ML IJ SOLN
0.2500 mg | Freq: Once | INTRAMUSCULAR | Status: AC
  Administered 2022-05-16: 0.25 mg via INTRAVENOUS
  Filled 2022-05-16: qty 1

## 2022-05-16 NOTE — ED Notes (Signed)
Per tech BS 154

## 2022-05-16 NOTE — ED Notes (Signed)
Per NP Randol Kern, pt does not need sliding scale insulin at this time. Re-check bgl and admin sliding scale 0800

## 2022-05-16 NOTE — ED Notes (Signed)
Bladder scan completed per providers orders with reading of >953mL. RN spoke with patient about placing foley and patient refused. RN informed pt of risks associated with urine retention such as hydronephrosis, bladder perforation, kidney failure, etc. With further consideration for risk based off pt lab values. Patient continues to refuse and is unreceptive to risk education. RN then proposed an intermittent cath if pt is adamant against foley placement. Patient again refused despite education. Pt currently attempting to void in urinal. Provider made aware.

## 2022-05-16 NOTE — Progress Notes (Signed)
       CROSS COVER NOTE  NAME: Darrell Walls MRN: [id number] DOB : 26-Dec-1990   HPI/Events of Note   Notified by RN endotool advising transition from drip  Assessment and  Interventions   Assessment: Gap is closed on chem panel Creatinine slightly improved Poor urine output - distended bladder on CT Plan: 1/2 home dose semglee (lantus given as one time dose and correction dose SSI ordered.  Bladder scan - in and out if volume over 400 Once dextrose fluids stopped, start LR at [place] NP [facility]

## 2022-05-16 NOTE — ED Notes (Signed)
Pt ambulated to bathroom with assistance. Pt mother is with patient at this time. Pt told to push call light when done, for assistance back to bed.

## 2022-05-16 NOTE — ED Notes (Signed)
Randol Kern, NP at bedside.

## 2022-05-16 NOTE — ED Notes (Signed)
Spoke with pt's mother to give an update on pt. Let her know that he is currently resting comfortably.

## 2022-05-16 NOTE — ED Notes (Signed)
Patient consented to Foley placement after speaking with provider

## 2022-05-16 NOTE — ED Notes (Signed)
Pt placed on a hospital bed

## 2022-05-16 NOTE — Inpatient Diabetes Management (Addendum)
Inpatient Diabetes Program Recommendations  AACE/ADA: New Consensus Statement on Inpatient Glycemic Control (2015)  Target Ranges:  Prepandial:   less than 140 mg/dL      Peak postprandial:   less than 180 mg/dL (1-2 hours)      Critically ill patients:  140 - 180 mg/dL    Latest Reference Range & Units 05/15/22 12:46  Sodium 135 - 145 mmol/L 141  Potassium 3.5 - 5.1 mmol/L 4.5  Chloride 98 - 111 mmol/L 108  CO2 22 - 32 mmol/L 16 (L)  Glucose 70 - 99 mg/dL 233 (H)  BUN 6 - 20 mg/dL 73 (H)  Creatinine 0.61 - 1.24 mg/dL 4.34 (H)  Calcium 8.9 - 10.3 mg/dL 8.7 (L)  Anion gap 5 - 15  17 (H)  (L): Data is abnormally low (H): Data is abnormally high  Latest Reference Range & Units 05/15/22 16:25 05/15/22 18:15 05/15/22 19:24 05/15/22 20:30 05/15/22 21:31 05/15/22 22:28  Glucose-Capillary 70 - 99 mg/dL 176 (H) 201 (H)  IV Insulin Drip Started _0  158 (H) 133 (H) 150 (H) 160 (H)  (H): Data is abnormally high  Latest Reference Range & Units 05/15/22 23:48 05/16/22 00:32 05/16/22 01:37 05/16/22 02:46 05/16/22 04:49 05/16/22 05:43  Glucose-Capillary 70 - 99 mg/dL 172 (H) 160 (H) 157 (H) 158 (H)  50 units Semglee _1  165 (H)  IV Insulin Drip Stopped _2  149 (H)  (H): Data is abnormally high    Admit with: DKA/ UTI  History: DM, CKD4  Home DM Meds: Lantus 10 units Daily (prescribed as 12 units daily)       Humalog 4 units TID with meals        Humalog 1 unit for every 50 mg/dl >200       Jardiance 10 mg daily  Current Orders: Novolog Resistant Correction Scale/ SSI (0-20 units) TID AC + HS    Per ENDO notes, pt is supposed to be taking Lantus 12 units daily at home Unsure why 50 units Lantus was given at 3am?? Please be cautious for Hypoglycemia May want to check CBGs Q2 hours for at least 12 hours to make sure pt not having issues with Hypoglycemia   MD- Please reduce the Novolog SSI to the 0-9 unit Sensitive scale  If allowed to eat, Add Novolog 4 units TID for meal  coverage  May want to stop Jardiance at time of d/c given pt admitted with DKA and has UTI    Addendum 10:55am--Met w/ pt at bedside in the ED.  Pt able to have conversation with me.  No vomiting at present.  Reviewed and confirmed home DM meds:  Lantus 12 units Daily + Humalog 4 units TID with meals + Humalog 1:50 >200 + Jardiance 10 mg Daily.  Pt told me he knows Jardiance causes UTIs and plans to stop taking it.  Asked pt to let his ENDO know he plans to stop this medication.  Also discussed with patient diagnosis of DKA (pathophysiology), treatment of DKA, lab results, and transition plan to SQ insulin regimen.  Has follow up appt with ENDO on 06/17/2022.  Pt told me the RN at the Diabetes Education office gave him a trial of the Freestyle Libre 3 CGM (pt was given 2 sensors) but that he thinks his insurance won't cover and he needs to possibly switch to the Dexcom CGM.  Pt did now have any additional DM questions for me.  Spoke w/ RN in the ED (and also send [place] as well) and  asked them to please monitor CBGs Q2H as ordered to be vigilant for possible Hypoglycemia.     ENDO: Dr. Starleen Blue with [facility] Last seen 04/09/2022 Pt was prescribed:  - Freestyle Libre CGM (Continuous Glucose Meter)  - Insulin Lantus 12 units daily - Insuln Aspart 4 units with meals:  - Continue using sliding scale starting at 200 in addition to 4 units (1:50 >200 correctional scale) A1c was 13.5% (03/12/2022)     --Will follow patient during hospitalization--  Wyn Quaker RN, MSN, [place] Diabetes Coordinator Inpatient Glycemic Control Team Team Pager: [phone] (8a-5p)

## 2022-05-16 NOTE — IPAL (Signed)
  Interdisciplinary Goals of Care Family Meeting   Date carried out: 05/16/2022  Location of the meeting: Bedside  Member's involved: Physician, Bedside Registered Nurse, and Family Member or next of kin  Durable Power of Attorney or acting medical decision maker: patient    Discussion: We discussed goals of care for Darrell Walls .    Code status:   Code Status: Full Code   Disposition: Continue current acute care  Time spent for the meeting: [place], MD  05/16/2022, 3:08 AM

## 2022-05-16 NOTE — Progress Notes (Signed)
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  On presentation, he was hypertensive, tachycardic.  Lab work showed creatinine of 4.34(baseline creatinine of 3.5), and a gap of 17.  CT abdomen/pelvis without contrast was negative for obstruction but showed marked distention of urinary bladder, enlarged prostate. Foley placed. Patient was admitted for the management of DKA, insulin drip was started.  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  Started on insulin drip as per protocol.  Since gap is closed, he has been transitioned to subcutaneous insulin.  Monitor blood sugars.  Diabetic coordinator following.  Abdomen pain/nausea/vomiting: Secondary to DKA, possible diabetic gastroparesis.  His last A1c was 13.5 on 03/12/2022.  Started on Reglan.  He has intermittent nausea and vomiting at home as per his mom.  CT abdomen.pelvis did not show any acute intra-abdominal findings besides distended bladder.  Abdominal pain has resolved.  UTI/acute urinary retention: Cystitis/prostatitis suspected on admission.  UA is not  that reassuring for UTI but showed bacteria.negative nitrates, few leukocytes.   He has already been started on ceftriaxone.  Follow-up urine culture .  No fever but has mild  leukocytosis.  No dysuria.  CT abdomen/pelvis negative for obstruction but shows prostatomegaly, urinary distention.  S/P  Foley catheter.  We shd given a voiding trial tomorrow, if he continues to retain, needs to put the Foley back and follow-up with urology.  Started on Flomax.  AKI on CKD stage IV: Follows with [facility] nephrology.  Baseline creatinine 3.5. takes telmisartan,, chlorthalidone, gabapentin at home.  These are on hold. Kidney function is slowly improving with IV fluids.  Anemia of chronic disease: Most likely from CKD.  Hemoglobin in the range of 9 on presentation, stable  Hypertensive urgency: Takes telmisartan, chlorthalidone, amlodipine, carvedilol at home.  Amlodipine restarted.  continue as needed medications for severe hypertension          DVT prophylaxis:enoxaparin (LOVENOX) injection 30 mg Start: 05/15/22 2200     Code Status: Full Code  Family Communication: Mother at bedside  Patient status:Inpatient  Patient is from :Home  Anticipated discharge XF:GHWE  Estimated DC date:1-2 days   Consultants: None  Procedures:None  Antimicrobials:  Anti-infectives (From admission, onward)    Start     Dose/Rate Route Frequency Ordered Stop   05/16/22 2000  cefTRIAXone (ROCEPHIN) 1 g in sodium chloride 0.9 % 100 mL IVPB        1 g 200 mL/hr over 30 Minutes Intravenous Every 24 hours 05/16/22 0733     05/15/22 2015  cefTRIAXone (ROCEPHIN) 1 g in sodium chloride 0.9 % 100 mL IVPB        1 g 200 mL/hr over 30 Minutes Intravenous  Once 05/15/22 2009 05/15/22 2117  Subjective: Patient seen and examined at bedside today.  Hemodynamically stable.  He was vomiting when I entered the room.  Mother was at bedside.  Denies abdominal pain.Foley catheter placement.  Objective: Vitals:   05/16/22 0300 05/16/22 0430 05/16/22 0450 05/16/22 0530  BP: (!) 171/93 (!) 167/99   (!) 162/95  Pulse: 92 93  85  Resp: 12 11  (!) 23  Temp:   98.7 F (37.1 C)   TempSrc:   Oral   SpO2: 99% 98%  99%    Intake/Output Summary (Last 24 hours) at 05/16/2022 0756 Last data filed at 05/16/2022 0539 Gross per 24 hour  Intake 2434.86 ml  Output 1400 ml  Net 1034.86 ml   There were no vitals filed for this visit.  Examination:  General exam: Nauseous, vomiting HEENT: PERRL Respiratory system:  no wheezes or crackles  Cardiovascular system: S1 & S2 heard, RRR.  Gastrointestinal system: Abdomen is nondistended, soft and nontender. Central nervous system: Alert and oriented Extremities: No edema, no clubbing ,no cyanosis Skin: No rashes, no ulcers,no icterus  GU: Foley   Data Reviewed: I have personally reviewed following labs and imaging studies  CBC: Recent Labs  Lab 05/15/22 1246 05/15/22 2354  WBC 9.7 13.6*  NEUTROABS 8.5*  --   HGB 9.1* 8.1*  HCT 27.6* 24.3*  MCV 86.8 86.5  PLT 274 646   Basic Metabolic Panel: Recent Labs  Lab 05/15/22 1246 05/15/22 2354 05/16/22 0410  NA 141 144 145  K 4.5 3.9 4.0  CL 108 112* 113*  CO2 16* 24 24  GLUCOSE 233* 176* 179*  BUN 73* 68* 65*  CREATININE 4.34* 4.00* 3.86*  CALCIUM 8.7* 8.4* 8.6*     No results found for this or any previous visit (from the past 240 hour(s)).   Radiology Studies: CT ABDOMEN PELVIS WO CONTRAST  Result Date: 05/15/2022 CLINICAL DATA:  Abdominal pain, vomiting EXAM: CT ABDOMEN AND PELVIS WITHOUT CONTRAST TECHNIQUE: Multidetector CT imaging of the abdomen and pelvis was performed following the standard protocol without IV contrast. RADIATION DOSE REDUCTION: This exam was performed according to the departmental dose-optimization program which includes automated exposure control, adjustment of the mA and/or kV according to patient size and/or use of iterative reconstruction technique. COMPARISON:  None Available. FINDINGS: Lower chest: Small pericardial effusion is present.  Visualized lower lung fields are unremarkable. Hepatobiliary: No focal abnormalities are seen in liver. There is no dilation of bile ducts. Gallbladder is unremarkable. Pancreas: No focal abnormalities are seen. Spleen: Unremarkable. Adrenals/Urinary Tract: Adrenals are unremarkable. There is no hydronephrosis. There is no perinephric fluid collection. There are no renal or ureteral stones. Urinary bladder is distended measuring 17.7 cm in maximum diameter. There is no wall thickening in the bladder. Stomach/Bowel: Stomach is unremarkable. Small bowel loops are not dilated. Appendix is not dilated. There is no significant wall thickening in colon. There is no pericolic stranding. Vascular/Lymphatic: Few small scattered arterial calcifications are seen. Reproductive: Prostate is enlarged. Other: There is no ascites or pneumoperitoneum. Musculoskeletal: No acute findings are seen. Degenerative changes with small bony spurs are noted in right hip. IMPRESSION: There is no evidence of intestinal obstruction or pneumoperitoneum. Appendix is not dilated. There is no hydronephrosis. There is marked distention of urinary bladder. Please correlate for possible bladder outlet obstruction. Prostate appears to be enlarged. Small pericardial effusion is present. Electronically Signed   By: Elmer Picker M.D.   On: 05/15/2022 18:08    Scheduled Meds:  amLODipine  10 mg  Oral Daily   carvedilol  25 mg Oral BID WC   enoxaparin (LOVENOX) injection  30 mg Subcutaneous Q24H   insulin aspart  0-9 Units Subcutaneous TID WC   mirtazapine  15 mg Oral QHS   tamsulosin  0.4 mg Oral Daily   Continuous Infusions:  cefTRIAXone (ROCEPHIN)  IV     dextrose 5% lactated ringers Stopped (05/16/22 0539)   lactated ringers     lactated ringers 125 mL/hr at 05/16/22 0542   promethazine (PHENERGAN) injection (IM or IVPB) Stopped (05/16/22 0153)     LOS: 1 day   Shelly Coss, MD [facility] P12/21/2023, 7:56 AM

## 2022-05-17 DIAGNOSIS — E1110 Type 2 diabetes mellitus with ketoacidosis without coma: Secondary | ICD-10-CM | POA: Diagnosis not present

## 2022-05-17 LAB — URINE CULTURE: Culture: NO GROWTH

## 2022-05-17 LAB — GLUCOSE, CAPILLARY
Glucose-Capillary: 83 mg/dL (ref 70–99)
Glucose-Capillary: 80 mg/dL (ref 70–99)
Glucose-Capillary: 51 mg/dL — ABNORMAL LOW (ref 70–99)
Glucose-Capillary: 83 mg/dL (ref 70–99)
Glucose-Capillary: 79 mg/dL (ref 70–99)
Glucose-Capillary: 88 mg/dL (ref 70–99)
Glucose-Capillary: 81 mg/dL (ref 70–99)

## 2022-05-17 LAB — BASIC METABOLIC PANEL
Anion gap: 9 (ref 5–15)
BUN: 51 mg/dL — ABNORMAL HIGH (ref 6–20)
CO2: 24 mmol/L (ref 22–32)
Calcium: 8.5 mg/dL — ABNORMAL LOW (ref 8.9–10.3)
Chloride: 108 mmol/L (ref 98–111)
Creatinine, Ser: 3.52 mg/dL — ABNORMAL HIGH (ref 0.61–1.24)
GFR, Estimated: 23 mL/min — ABNORMAL LOW (ref >60)
Glucose, Bld: 71 mg/dL (ref 70–99)
Potassium: 3.6 mmol/L (ref 3.5–5.1)
Sodium: 141 mmol/L (ref 135–145)

## 2022-05-17 LAB — CBC
HCT: 24.9 % — ABNORMAL LOW (ref 39.0–52.0)
Hemoglobin: 8.4 g/dL — ABNORMAL LOW (ref 13.0–17.0)
MCH: 29.1 pg (ref 26.0–34.0)
MCHC: 33.7 g/dL (ref 30.0–36.0)
MCV: 86.2 fL (ref 80.0–100.0)
Platelets: 218 10*3/uL (ref 150–400)
RBC: 2.89 MIL/uL — ABNORMAL LOW (ref 4.22–5.81)
RDW: 12.7 % (ref 11.5–15.5)
WBC: 10.3 10*3/uL (ref 4.0–10.5)
nRBC: 0 % (ref 0.0–0.2)

## 2022-05-17 MED ORDER — TAMSULOSIN HCL 0.4 MG PO CAPS: 0.4000 mg | ORAL_CAPSULE | Freq: Every day | ORAL | 0 refills | Status: DC

## 2022-05-17 MED ORDER — GABAPENTIN 300 MG PO CAPS
300.0000 mg | ORAL_CAPSULE | Freq: Two times a day (BID) | ORAL | Status: DC
  Filled 2022-05-17: qty 1

## 2022-05-17 MED ORDER — HYDROXYZINE HCL 25 MG PO TABS: 25.0000 mg | ORAL_TABLET | Freq: Three times a day (TID) | ORAL | Status: DC | PRN

## 2022-05-17 MED ORDER — ATORVASTATIN CALCIUM 20 MG PO TABS
20.0000 mg | ORAL_TABLET | Freq: Every day | ORAL | Status: DC
  Filled 2022-05-17: qty 1

## 2022-05-17 MED ORDER — CEFDINIR 300 MG PO CAPS: 300.0000 mg | ORAL_CAPSULE | Freq: Two times a day (BID) | ORAL | 0 refills | Status: AC

## 2022-05-17 MED ORDER — INSULIN ASPART 100 UNIT/ML IJ SOLN: 4.0000 [IU] | Freq: Three times a day (TID) | INTRAMUSCULAR | Status: DC

## 2022-05-17 MED ORDER — PANTOPRAZOLE SODIUM 40 MG PO TBEC
40.0000 mg | DELAYED_RELEASE_TABLET | Freq: Two times a day (BID) | ORAL | Status: DC
  Filled 2022-05-17: qty 1

## 2022-05-17 NOTE — Discharge Summary (Signed)
Physician Discharge Summary   Patient: Darrell Walls MRN: [id number] DOB: 04-18-1991  Admit date:     05/15/2022  Discharge date: 05/17/22  Discharge Physician: Donnamae Jude   PCP: System, Provider Not In   Recommendations at discharge:   Follow-up with PCP in 1 to 2 weeks Follow-up with endocrinology in the next 2 to 3 weeks Repeat CMP in the next 1 to 2 weeks. Follow-up with nephrology.  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  He was admitted and given IV fluids and insulin drip per DKA protocols.  Assessment and Plan: * DKA, type 2 ([place]) Patient received IV insulin and insulin drip prior to per protocol. As his acidemia was corrected he was given long-acting insulin and resumed on short acting sliding scale insulin.  His CBGs were well-controlled throughout his hospitalization.   Abdominal pain Vomiting, nonbloody nonbilious Likely related to DKA and some possible issue with urinary retention.  Urinary tract infection Cystitis versus prostatitis Given Rocephin x 1 Follow cultures are negative, given 5 days of cephalosporin as this may have been the cause of DKA in the first place.  Acute urinary retention Noted on CT initially.  Did receive  Foley cath which was discontinued patient was voiding without difficulty prior to discharge.  He was started on Flomax is to be continued postdischarge.   Acute renal failure superimposed on stage 4 chronic kidney disease ([place]) Likely with both prerenal and post renal components related to vomiting and urinary retention respectively Improved with correction of his acidemia and IV fluid hydration. Serum creatinine is back to baseline at discharge. 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   atorvastatin 20 MG tablet Commonly known as: LIPITOR Take 1 tablet by mouth daily.   carvedilol 25 MG tablet Commonly known as: COREG Take 25 mg by mouth 2 (two) times daily.   cefdinir 300 MG capsule Commonly known as: OMNICEF Take 1 capsule (300 mg total) by mouth 2 (two)  times daily for 5 days.   chlorthalidone 25 MG tablet Commonly known as: HYGROTON Take 25 mg by mouth daily.   empagliflozin 10 MG Tabs tablet Commonly known as: JARDIANCE Take 1 tablet by mouth daily.   gabapentin 300 MG capsule Commonly known as: NEURONTIN Take 300 mg by mouth 2 (two) times daily.   hydrOXYzine 25 MG tablet Commonly known as: ATARAX Take 25 mg by mouth every 8 (eight) hours as needed.    insulin glargine 100 UNIT/ML injection Commonly known as: LANTUS Inject 0.1 mLs (10 Units total) into the skin daily. What changed: how much to take   insulin lispro 100 UNIT/ML KwikPen Commonly known as: HUMALOG Inject 4 Units into the skin 3 (three) times daily.   Insulin Pen Needle 29G X 5MM Misc Please dispense 1 box of 100 insulin needles and syringes   mirtazapine 15 MG tablet Commonly known as: REMERON Take 15 mg by mouth at bedtime.   pantoprazole 40 MG tablet Commonly known as: PROTONIX Take 40 mg by mouth 2 (two) times daily.   tamsulosin 0.4 MG Caps capsule Commonly known as: FLOMAX Take 1 capsule (0.4 mg total) by mouth daily. Start taking on: May 18, 2022        Follow-up Bono, Drue Stager, MD Follow up.   Specialty: Family Medicine Contact information: [address] [place] [address] [phone]         Mottl, Andrez Grime, MD Follow up.   Specialty: Nephrology Contact information: [address] [place] [address] [phone]                Discharge Exam: Vitals:   05/16/22 2000 05/16/22 2130 05/17/22 0259 05/17/22 0838  BP: (!) 140/69 (!) 155/83 (!) 162/86 (!) 166/92  Pulse: 82 81 78 77  Resp: 14 16 18 18   Temp:  98.3 F (36.8 C) 97.8 F (36.6 C) 98.6 F (37 C)  TempSrc:  Oral Oral Oral  SpO2: 98% 100% 100% 100%    Physical Examination: General appearance - alert, well appearing, and in no distress Chest - clear to auscultation, no wheezes, rales or rhonchi, symmetric air entry Heart - normal rate, regular rhythm, normal S1, S2, no murmurs, rubs, clicks or gallops Abdomen - soft, nontender, nondistended, no masses or organomegaly Extremities - peripheral pulses normal, no pedal edema, no clubbing or cyanosis   Condition at discharge: stable  The results of significant diagnostics from this hospitalization (including imaging, microbiology, ancillary and laboratory) are listed below for  reference.   Imaging Studies: CT ABDOMEN PELVIS WO CONTRAST  Result Date: 05/15/2022 CLINICAL DATA:  Abdominal pain, vomiting EXAM: CT ABDOMEN AND PELVIS WITHOUT CONTRAST TECHNIQUE: Multidetector CT imaging of the abdomen and pelvis was performed following the standard protocol without IV contrast. RADIATION DOSE REDUCTION: This exam was performed according to the departmental dose-optimization program which includes automated exposure control, adjustment of the mA and/or kV according to patient size and/or use of iterative reconstruction technique. COMPARISON:  None Available. FINDINGS: Lower chest: Small pericardial effusion is present. Visualized lower lung fields are unremarkable. Hepatobiliary: No focal abnormalities are seen in liver. There is no dilation of bile ducts. Gallbladder is unremarkable. Pancreas: No focal abnormalities are seen. Spleen: Unremarkable. Adrenals/Urinary Tract: Adrenals are unremarkable. There is no hydronephrosis. There is no perinephric fluid collection. There are no renal or ureteral stones. Urinary bladder is distended measuring 17.7 cm in maximum diameter. There is no wall thickening in  the bladder. Stomach/Bowel: Stomach is unremarkable. Small bowel loops are not dilated. Appendix is not dilated. There is no significant wall thickening in colon. There is no pericolic stranding. Vascular/Lymphatic: Few small scattered arterial calcifications are seen. Reproductive: Prostate is enlarged. Other: There is no ascites or pneumoperitoneum. Musculoskeletal: No acute findings are seen. Degenerative changes with small bony spurs are noted in right hip. IMPRESSION: There is no evidence of intestinal obstruction or pneumoperitoneum. Appendix is not dilated. There is no hydronephrosis. There is marked distention of urinary bladder. Please correlate for possible bladder outlet obstruction. Prostate appears to be enlarged. Small pericardial effusion is present. Electronically Signed   By:  Elmer Picker M.D.   On: 05/15/2022 18:08    Microbiology: Results for orders placed or performed during the hospital encounter of 05/15/22  Urine Culture     Status: None   Collection Time: 05/15/22  7:48 PM   Specimen: Urine, Random  Result Value Ref Range Status   Specimen Description   Final    URINE, RANDOM Performed at [facility], [address], [place], [place] [address]    Special Requests   Final    NONE Performed at [facility], [address], [place], [place] [address]    Culture   Final    NO GROWTH Performed at [facility], [place] [address], [place], [place] [address]    Report Status 05/17/2022 FINAL  Final    Labs: CBC: Recent Labs  Lab 05/15/22 1246 05/15/22 2354 05/17/22 0353  WBC 9.7 13.6* 10.3  NEUTROABS 8.5*  --   --   HGB 9.1* 8.1* 8.4*  HCT 27.6* 24.3* 24.9*  MCV 86.8 86.5 86.2  PLT 274 244 867   Basic Metabolic Panel: Recent Labs  Lab 05/15/22 1246 05/15/22 2354 05/16/22 0410 05/17/22 0353  NA 141 144 145 141  K 4.5 3.9 4.0 3.6  CL 108 112* 113* 108  CO2 16* 24 24 24   GLUCOSE 233* 176* 179* 71  BUN 73* 68* 65* 51*  CREATININE 4.34* 4.00* 3.86* 3.52*  CALCIUM 8.7* 8.4* 8.6* 8.5*   Liver Function Tests: Recent Labs  Lab 05/15/22 1246  AST 27  ALT 48*  ALKPHOS 141*  BILITOT 1.1  PROT 7.0  ALBUMIN 3.7   CBG: Recent Labs  Lab 05/17/22 0519 05/17/22 0540 05/17/22 0907 05/17/22 1221 05/17/22 1528  GLUCAP 51* 83 79 88 81    Discharge time spent: greater than 30 minutes.  Signed: Donnamae Jude, MD [facility] 05/17/2022

## 2022-05-17 NOTE — Hospital Course (Signed)
Patient is a 31 year old male with insulin-dependent diabetes, diabetic nephropathy, stage IV chronic kidney disease.  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  He was admitted and given IV fluids and insulin drip per DKA protocols.

## 2022-05-17 NOTE — TOC Transition Note (Signed)
Transition of Care [facility]) - CM/SW Discharge Note   Patient Details  Name: Darrell Walls MRN: [id number] Date of Birth: Mar 28, 1991  Transition [facility] CM/SW Contact:  Gerilyn Pilgrim, LCSW Phone Number: 05/17/2022, 3:47 PM   Clinical Narrative: Chart reviewed. Reassessment assessment complete. Pt has discharge orders in. TOC signing off.        Barriers to Discharge: Barriers Resolved   Patient Goals and CMS Choice      Discharge Placement                         Discharge Plan and Services Additional resources added to the After Visit Summary for                                       Social Determinants of Health (SDOH) Interventions [place]: No Food Insecurity (05/16/2022)  Housing: Low Risk  (05/16/2022)  Transportation Needs: No Transportation Needs (05/16/2022)  Utilities: Not At Risk (05/16/2022)  Tobacco Use: High Risk (05/16/2022)     Readmission Risk Interventions    05/17/2022    3:45 PM  Readmission Risk Prevention Plan  Transportation Screening Complete  PCP or Specialist Appt within 3-5 Days Complete  Palliative Care Screening Not Applicable  Medication Review (RN Care Manager) Referral to Pharmacy

## 2022-06-28 DIAGNOSIS — I10 Essential (primary) hypertension: Secondary | ICD-10-CM | POA: Diagnosis not present

## 2022-07-03 DIAGNOSIS — R0681 Apnea, not elsewhere classified: Secondary | ICD-10-CM | POA: Diagnosis not present

## 2022-07-03 DIAGNOSIS — N184 Chronic kidney disease, stage 4 (severe): Secondary | ICD-10-CM | POA: Diagnosis not present

## 2022-07-03 DIAGNOSIS — N179 Acute kidney failure, unspecified: Secondary | ICD-10-CM | POA: Diagnosis not present

## 2022-07-03 DIAGNOSIS — R351 Nocturia: Secondary | ICD-10-CM | POA: Diagnosis not present

## 2022-07-03 DIAGNOSIS — R400 Somnolence: Secondary | ICD-10-CM | POA: Diagnosis not present

## 2022-07-03 DIAGNOSIS — F4325 Adjustment disorder with mixed disturbance of emotions and conduct: Secondary | ICD-10-CM | POA: Diagnosis not present

## 2022-07-03 DIAGNOSIS — N1832 Chronic kidney disease, stage 3b: Secondary | ICD-10-CM | POA: Diagnosis not present

## 2022-07-03 DIAGNOSIS — I10 Essential (primary) hypertension: Secondary | ICD-10-CM | POA: Diagnosis not present

## 2022-07-03 DIAGNOSIS — G478 Other sleep disorders: Secondary | ICD-10-CM | POA: Diagnosis not present

## 2022-07-03 DIAGNOSIS — R0683 Snoring: Secondary | ICD-10-CM | POA: Diagnosis not present

## 2022-07-05 DIAGNOSIS — I10 Essential (primary) hypertension: Secondary | ICD-10-CM | POA: Diagnosis not present

## 2022-07-08 DIAGNOSIS — I10 Essential (primary) hypertension: Secondary | ICD-10-CM | POA: Diagnosis not present

## 2022-07-09 DIAGNOSIS — I10 Essential (primary) hypertension: Secondary | ICD-10-CM | POA: Diagnosis not present

## 2022-07-10 DIAGNOSIS — I10 Essential (primary) hypertension: Secondary | ICD-10-CM | POA: Diagnosis not present

## 2022-07-11 DIAGNOSIS — I10 Essential (primary) hypertension: Secondary | ICD-10-CM | POA: Diagnosis not present

## 2022-07-12 DIAGNOSIS — I10 Essential (primary) hypertension: Secondary | ICD-10-CM | POA: Diagnosis not present

## 2022-07-15 DIAGNOSIS — I10 Essential (primary) hypertension: Secondary | ICD-10-CM | POA: Diagnosis not present

## 2022-07-16 DIAGNOSIS — I10 Essential (primary) hypertension: Secondary | ICD-10-CM | POA: Diagnosis not present

## 2022-07-17 DIAGNOSIS — I10 Essential (primary) hypertension: Secondary | ICD-10-CM | POA: Diagnosis not present

## 2022-07-18 DIAGNOSIS — I10 Essential (primary) hypertension: Secondary | ICD-10-CM | POA: Diagnosis not present

## 2022-07-19 DIAGNOSIS — I10 Essential (primary) hypertension: Secondary | ICD-10-CM | POA: Diagnosis not present

## 2022-07-22 DIAGNOSIS — I10 Essential (primary) hypertension: Secondary | ICD-10-CM | POA: Diagnosis not present

## 2022-07-23 DIAGNOSIS — I10 Essential (primary) hypertension: Secondary | ICD-10-CM | POA: Diagnosis not present

## 2022-07-23 DIAGNOSIS — N189 Chronic kidney disease, unspecified: Secondary | ICD-10-CM | POA: Diagnosis not present

## 2022-07-23 DIAGNOSIS — R079 Chest pain, unspecified: Secondary | ICD-10-CM | POA: Diagnosis not present

## 2022-07-23 DIAGNOSIS — N179 Acute kidney failure, unspecified: Secondary | ICD-10-CM | POA: Diagnosis not present

## 2022-07-23 DIAGNOSIS — R197 Diarrhea, unspecified: Secondary | ICD-10-CM | POA: Diagnosis not present

## 2022-07-24 DIAGNOSIS — I10 Essential (primary) hypertension: Secondary | ICD-10-CM | POA: Diagnosis not present

## 2022-07-25 DIAGNOSIS — I10 Essential (primary) hypertension: Secondary | ICD-10-CM | POA: Diagnosis not present

## 2022-07-26 DIAGNOSIS — I10 Essential (primary) hypertension: Secondary | ICD-10-CM | POA: Diagnosis not present

## 2022-10-26 DIAGNOSIS — G4733 Obstructive sleep apnea (adult) (pediatric): Secondary | ICD-10-CM | POA: Insufficient documentation

## 2022-10-26 HISTORY — PX: DIALYSIS/PERMA CATHETER INSERTION: CATH118288

## 2023-02-04 ENCOUNTER — Other Ambulatory Visit (INDEPENDENT_AMBULATORY_CARE_PROVIDER_SITE_OTHER): Payer: Self-pay | Admitting: Nurse Practitioner

## 2023-02-04 DIAGNOSIS — N186 End stage renal disease: Secondary | ICD-10-CM

## 2023-02-06 ENCOUNTER — Other Ambulatory Visit (INDEPENDENT_AMBULATORY_CARE_PROVIDER_SITE_OTHER): Payer: Medicaid Other

## 2023-02-06 ENCOUNTER — Encounter (INDEPENDENT_AMBULATORY_CARE_PROVIDER_SITE_OTHER): Payer: Medicaid Other

## 2023-02-06 ENCOUNTER — Encounter (INDEPENDENT_AMBULATORY_CARE_PROVIDER_SITE_OTHER): Payer: Medicaid Other | Admitting: Nurse Practitioner

## 2023-02-22 DIAGNOSIS — E119 Type 2 diabetes mellitus without complications: Secondary | ICD-10-CM | POA: Insufficient documentation

## 2023-03-04 HISTORY — PX: PERITONEAL CATHETER INSERTION: SHX2223

## 2023-04-09 ENCOUNTER — Encounter (INDEPENDENT_AMBULATORY_CARE_PROVIDER_SITE_OTHER): Payer: Medicaid Other

## 2023-04-09 ENCOUNTER — Encounter (INDEPENDENT_AMBULATORY_CARE_PROVIDER_SITE_OTHER): Payer: Medicaid Other | Admitting: Nurse Practitioner

## 2023-04-09 ENCOUNTER — Other Ambulatory Visit (INDEPENDENT_AMBULATORY_CARE_PROVIDER_SITE_OTHER): Payer: Medicaid Other

## 2023-06-30 ENCOUNTER — Encounter (INDEPENDENT_AMBULATORY_CARE_PROVIDER_SITE_OTHER): Payer: Self-pay | Admitting: Nurse Practitioner

## 2023-06-30 ENCOUNTER — Ambulatory Visit (INDEPENDENT_AMBULATORY_CARE_PROVIDER_SITE_OTHER): Payer: Medicaid Other

## 2023-06-30 ENCOUNTER — Ambulatory Visit (INDEPENDENT_AMBULATORY_CARE_PROVIDER_SITE_OTHER): Payer: Medicare Other | Admitting: Nurse Practitioner

## 2023-06-30 ENCOUNTER — Ambulatory Visit (INDEPENDENT_AMBULATORY_CARE_PROVIDER_SITE_OTHER): Payer: Medicare Other

## 2023-06-30 VITALS — BP 133/82 | HR 75 | Resp 16 | Ht 71.0 in | Wt 179.0 lb

## 2023-06-30 DIAGNOSIS — E119 Type 2 diabetes mellitus without complications: Secondary | ICD-10-CM | POA: Diagnosis not present

## 2023-06-30 DIAGNOSIS — N186 End stage renal disease: Secondary | ICD-10-CM

## 2023-06-30 DIAGNOSIS — Z794 Long term (current) use of insulin: Secondary | ICD-10-CM | POA: Diagnosis not present

## 2023-06-30 NOTE — Progress Notes (Signed)
Subjective:    Patient ID: Darrell Walls, male    DOB: 1991-01-19, 33 y.o.   MRN: [id number] Chief Complaint  Patient presents with   New Patient (Initial Visit)    Ref Thedore Mins consult vein [email or web address]    The patient is seen for evaluation for dialysis access.  He has been on dialysis for about a year.  He is currently maintained via PermCath.  He has a PD catheter in place that was never used.  He notes that he had it placed with the intention of use however [place] the [place] he discovered that peritoneal dialysis was not for him.  The patient is followed by nephrology.    The patient is right-handed.  The patient has been considering the various methods of dialysis and wishes to proceed with hemodialysis and therefore creation of AV access is indicated.  No recent shortening of the patient's walking distance or new symptoms consistent with claudication.  No history of rest pain symptoms. No new ulcers or wounds of the lower extremities have occurred.  The patient denies amaurosis fugax or recent TIA symptoms. There are no recent neurological changes noted. There is no history of DVT, PE or superficial thrombophlebitis. No recent episodes of angina or shortness of breath documented.   Today noninvasive studies show adequate vein diameter for fistula creation bilaterally.  There is also some noted medial calcification bilaterally    Review of Systems  All other systems reviewed and are negative.      Objective:   Physical Exam Vitals reviewed.  HENT:     Head: Normocephalic.  Cardiovascular:     Rate and Rhythm: Normal rate.     Pulses:          Radial pulses are 1+ on the left side.  Pulmonary:     Effort: Pulmonary effort is normal.  Skin:    General: Skin is warm and dry.  Neurological:     Mental Status: He is alert and oriented to person, place, and time.  Psychiatric:        Mood and Affect: Mood normal.        Behavior: Behavior normal.        Thought  Content: Thought content normal.     BP 133/82   Pulse 75   Resp 16   Ht 5\' 11"  (1.803 m)   Wt 179 lb (81.2 kg)   BMI 24.97 kg/m   Past Medical History:  Diagnosis Date   Diabetes mellitus without complication (HCC)     Social History   Socioeconomic History   Marital status: Single    Spouse name: Not on file   Number of children: Not on file   Years of education: Not on file   Highest education level: Not on file  Occupational History   Not on file  Tobacco Use   Smoking status: Former    Current packs/day: 0.00    Types: Cigarettes    Quit date: 05/2023    Years since quitting: 0.0   Smokeless tobacco: Never  Vaping Use   Vaping status: Never Used  Substance and Sexual Activity   Alcohol use: Not Currently   Drug use: Yes    Types: Marijuana   Sexual activity: Not on file  Other Topics Concern   Not on file  Social History Narrative   Not on file   Social Drivers of Health   Financial Resource Strain: Low Risk  (12/19/2022)   Received from [facility]   Overall Financial Resource Strain (CARDIA)    Difficulty of Paying Living Expenses: Not hard at all  Recent Concern: Financial Resource Strain - High Risk (10/22/2022)   Received from [facility]   Overall Financial Resource Strain (CARDIA)    Difficulty of Paying Living Expenses: Hard  Food Insecurity: No Food Insecurity (12/19/2022)   Received from [facility]   Hunger Vital Sign    Worried About Running Out of Food in the Last Year: Never true    Ran Out of Food in the Last Year: Never true  Recent Concern: Food Insecurity - Food Insecurity Present (10/22/2022)   Received from [facility]   Hunger Vital Sign    Worried About Running Out of Food in the Last Year: Sometimes true    Ran Out of Food in the Last Year: Never true  Transportation Needs: No Transportation Needs (12/19/2022)   Received from [facility] - Transportation    Lack  of Transportation (Medical): No    Lack of Transportation (Non-Medical): No  Physical Activity: Not on file  Stress: No Stress Concern Present (03/04/2023)   Received from [facility] - Occupational Stress Questionnaire    Feeling of Stress : Not at all  Social Connections: Unknown (02/03/2023)   Received from [facility]   Social Network    Social Network: Not on file  Intimate Partner Violence: Unknown (02/03/2023)   Received from [facility]   HITS    Physically Hurt: Not on file    Insult or Talk Down To: Not on file    Threaten Physical Harm: Not on file    Scream or Curse: Not on file    Past Surgical History:  Procedure Laterality Date   EYE SURGERY     TONSILLECTOMY      Family History  Problem Relation Age of Onset   Healthy Mother     No Known Allergies     Latest Ref Rng & Units 05/17/2022    3:53 AM 05/15/2022   11:54 PM 05/15/2022   12:46 PM  CBC  WBC 4.0 - 10.5 K/uL 10.3  13.6  9.7   Hemoglobin 13.0 - 17.0 g/dL 8.4  8.1  9.1   Hematocrit 39.0 - 52.0 % 24.9  24.3  27.6   Platelets 150 - 400 K/uL 218  244  274       CMP     Component Value Date/Time   NA 141 05/17/2022 0353   K 3.6 05/17/2022 0353   CL 108 05/17/2022 0353   CO2 24 05/17/2022 0353   GLUCOSE 71 05/17/2022 0353   BUN 51 (H) 05/17/2022 0353   CREATININE 3.52 (H) 05/17/2022 0353   CALCIUM 8.5 (L) 05/17/2022 0353   PROT 7.0 05/15/2022 1246   ALBUMIN 3.7 05/15/2022 1246   AST 27 05/15/2022 1246   ALT 48 (H) 05/15/2022 1246   ALKPHOS 141 (H) 05/15/2022 1246   BILITOT 1.1 05/15/2022 1246   GFRNONAA 23 (L) 05/17/2022 0353     No results found.     Assessment & Plan:   1. ESRD (end stage renal disease) (HCC) (Primary) Recommend:  At this time the patient does not have appropriate extremity access for dialysis  Patient should have a left radiocephalic AV fistula created.  However if vein diameters proved too small he should have  a brachiocephalic AV fistula  created.  In addition the patient is to have his PD catheter removed  The risks, benefits and alternative therapies were reviewed in detail with the patient.  All questions were answered.  The patient agrees to proceed with surgery.   The patient will follow up with me in the office after the surgery.  2. Type 2 diabetes mellitus without complication, with long-term current use of insulin (HCC) Continue hypoglycemic medications as already ordered, these medications have been reviewed and there are no changes at this time.  Hgb A1C to be monitored as already arranged by primary service   Current Outpatient Medications on File Prior to Visit  Medication Sig Dispense Refill   atorvastatin (LIPITOR) 20 MG tablet Take 1 tablet by mouth daily.     carvedilol (COREG) 25 MG tablet Take 25 mg by mouth 2 (two) times daily.     chlorthalidone (HYGROTON) 25 MG tablet Take 25 mg by mouth daily.     gabapentin (NEURONTIN) 300 MG capsule Take 300 mg by mouth 2 (two) times daily.     hydrOXYzine (ATARAX) 25 MG tablet Take 25 mg by mouth every 8 (eight) hours as needed.     insulin glargine (LANTUS) 100 UNIT/ML injection Inject 0.1 mLs (10 Units total) into the skin daily. (Patient taking differently: Inject 12 Units into the skin daily.) 10 mL 0   insulin lispro (HUMALOG) 100 UNIT/ML KwikPen Inject 4 Units into the skin 3 (three) times daily.     Insulin Pen Needle 29G X MISC Please dispense 1 box of 100 insulin needles and syringes 100 each 1   pantoprazole (PROTONIX) 40 MG tablet Take 40 mg by mouth 2 (two) times daily.     tamsulosin (FLOMAX) 0.4 MG CAPS capsule Take 1 capsule (0.4 mg total) by mouth daily. 30 capsule 0   empagliflozin (JARDIANCE) 10 MG TABS tablet Take 1 tablet by mouth daily. (Patient not taking: Reported on 06/30/2023)     No current facility-administered medications on file prior to visit.    There are no Patient Instructions on file for this  visit. No follow-ups on file.   Georgiana Spinner, NP

## 2023-06-30 NOTE — H&P (View-Only) (Signed)
 Subjective:    Patient ID: Darrell Walls, male    DOB: 1991-01-19, 33 y.o.   MRN: [id number] Chief Complaint  Patient presents with   New Patient (Initial Visit)    Ref Thedore Mins consult vein [email or web address]    The patient is seen for evaluation for dialysis access.  He has been on dialysis for about a year.  He is currently maintained via PermCath.  He has a PD catheter in place that was never used.  He notes that he had it placed with the intention of use however [place] the [place] he discovered that peritoneal dialysis was not for him.  The patient is followed by nephrology.    The patient is right-handed.  The patient has been considering the various methods of dialysis and wishes to proceed with hemodialysis and therefore creation of AV access is indicated.  No recent shortening of the patient's walking distance or new symptoms consistent with claudication.  No history of rest pain symptoms. No new ulcers or wounds of the lower extremities have occurred.  The patient denies amaurosis fugax or recent TIA symptoms. There are no recent neurological changes noted. There is no history of DVT, PE or superficial thrombophlebitis. No recent episodes of angina or shortness of breath documented.   Today noninvasive studies show adequate vein diameter for fistula creation bilaterally.  There is also some noted medial calcification bilaterally    Review of Systems  All other systems reviewed and are negative.      Objective:   Physical Exam Vitals reviewed.  HENT:     Head: Normocephalic.  Cardiovascular:     Rate and Rhythm: Normal rate.     Pulses:          Radial pulses are 1+ on the left side.  Pulmonary:     Effort: Pulmonary effort is normal.  Skin:    General: Skin is warm and dry.  Neurological:     Mental Status: He is alert and oriented to person, place, and time.  Psychiatric:        Mood and Affect: Mood normal.        Behavior: Behavior normal.        Thought  Content: Thought content normal.     BP 133/82   Pulse 75   Resp 16   Ht 5\' 11"  (1.803 m)   Wt 179 lb (81.2 kg)   BMI 24.97 kg/m   Past Medical History:  Diagnosis Date   Diabetes mellitus without complication (HCC)     Social History   Socioeconomic History   Marital status: Single    Spouse name: Not on file   Number of children: Not on file   Years of education: Not on file   Highest education level: Not on file  Occupational History   Not on file  Tobacco Use   Smoking status: Former    Current packs/day: 0.00    Types: Cigarettes    Quit date: 05/2023    Years since quitting: 0.0   Smokeless tobacco: Never  Vaping Use   Vaping status: Never Used  Substance and Sexual Activity   Alcohol use: Not Currently   Drug use: Yes    Types: Marijuana   Sexual activity: Not on file  Other Topics Concern   Not on file  Social History Narrative   Not on file   Social Drivers of Health   Financial Resource Strain: Low Risk  (12/19/2022)   Received from [facility]   Overall Financial Resource Strain (CARDIA)    Difficulty of Paying Living Expenses: Not hard at all  Recent Concern: Financial Resource Strain - High Risk (10/22/2022)   Received from [facility]   Overall Financial Resource Strain (CARDIA)    Difficulty of Paying Living Expenses: Hard  Food Insecurity: No Food Insecurity (12/19/2022)   Received from [facility]   Hunger Vital Sign    Worried About Running Out of Food in the Last Year: Never true    Ran Out of Food in the Last Year: Never true  Recent Concern: Food Insecurity - Food Insecurity Present (10/22/2022)   Received from [facility]   Hunger Vital Sign    Worried About Running Out of Food in the Last Year: Sometimes true    Ran Out of Food in the Last Year: Never true  Transportation Needs: No Transportation Needs (12/19/2022)   Received from [facility] - Transportation    Lack  of Transportation (Medical): No    Lack of Transportation (Non-Medical): No  Physical Activity: Not on file  Stress: No Stress Concern Present (03/04/2023)   Received from [facility] - Occupational Stress Questionnaire    Feeling of Stress : Not at all  Social Connections: Unknown (02/03/2023)   Received from [facility]   Social Network    Social Network: Not on file  Intimate Partner Violence: Unknown (02/03/2023)   Received from [facility]   HITS    Physically Hurt: Not on file    Insult or Talk Down To: Not on file    Threaten Physical Harm: Not on file    Scream or Curse: Not on file    Past Surgical History:  Procedure Laterality Date   EYE SURGERY     TONSILLECTOMY      Family History  Problem Relation Age of Onset   Healthy Mother     No Known Allergies     Latest Ref Rng & Units 05/17/2022    3:53 AM 05/15/2022   11:54 PM 05/15/2022   12:46 PM  CBC  WBC 4.0 - 10.5 K/uL 10.3  13.6  9.7   Hemoglobin 13.0 - 17.0 g/dL 8.4  8.1  9.1   Hematocrit 39.0 - 52.0 % 24.9  24.3  27.6   Platelets 150 - 400 K/uL 218  244  274       CMP     Component Value Date/Time   NA 141 05/17/2022 0353   K 3.6 05/17/2022 0353   CL 108 05/17/2022 0353   CO2 24 05/17/2022 0353   GLUCOSE 71 05/17/2022 0353   BUN 51 (H) 05/17/2022 0353   CREATININE 3.52 (H) 05/17/2022 0353   CALCIUM 8.5 (L) 05/17/2022 0353   PROT 7.0 05/15/2022 1246   ALBUMIN 3.7 05/15/2022 1246   AST 27 05/15/2022 1246   ALT 48 (H) 05/15/2022 1246   ALKPHOS 141 (H) 05/15/2022 1246   BILITOT 1.1 05/15/2022 1246   GFRNONAA 23 (L) 05/17/2022 0353     No results found.     Assessment & Plan:   1. ESRD (end stage renal disease) (HCC) (Primary) Recommend:  At this time the patient does not have appropriate extremity access for dialysis  Patient should have a left radiocephalic AV fistula created.  However if vein diameters proved too small he should have  a brachiocephalic AV fistula  created.  In addition the patient is to have his PD catheter removed  The risks, benefits and alternative therapies were reviewed in detail with the patient.  All questions were answered.  The patient agrees to proceed with surgery.   The patient will follow up with me in the office after the surgery.  2. Type 2 diabetes mellitus without complication, with long-term current use of insulin (HCC) Continue hypoglycemic medications as already ordered, these medications have been reviewed and there are no changes at this time.  Hgb A1C to be monitored as already arranged by primary service   Current Outpatient Medications on File Prior to Visit  Medication Sig Dispense Refill   atorvastatin (LIPITOR) 20 MG tablet Take 1 tablet by mouth daily.     carvedilol (COREG) 25 MG tablet Take 25 mg by mouth 2 (two) times daily.     chlorthalidone (HYGROTON) 25 MG tablet Take 25 mg by mouth daily.     gabapentin (NEURONTIN) 300 MG capsule Take 300 mg by mouth 2 (two) times daily.     hydrOXYzine (ATARAX) 25 MG tablet Take 25 mg by mouth every 8 (eight) hours as needed.     insulin glargine (LANTUS) 100 UNIT/ML injection Inject 0.1 mLs (10 Units total) into the skin daily. (Patient taking differently: Inject 12 Units into the skin daily.) 10 mL 0   insulin lispro (HUMALOG) 100 UNIT/ML KwikPen Inject 4 Units into the skin 3 (three) times daily.     Insulin Pen Needle 29G X MISC Please dispense 1 box of 100 insulin needles and syringes 100 each 1   pantoprazole (PROTONIX) 40 MG tablet Take 40 mg by mouth 2 (two) times daily.     tamsulosin (FLOMAX) 0.4 MG CAPS capsule Take 1 capsule (0.4 mg total) by mouth daily. 30 capsule 0   empagliflozin (JARDIANCE) 10 MG TABS tablet Take 1 tablet by mouth daily. (Patient not taking: Reported on 06/30/2023)     No current facility-administered medications on file prior to visit.    There are no Patient Instructions on file for this  visit. No follow-ups on file.   Georgiana Spinner, NP

## 2023-07-03 ENCOUNTER — Telehealth (INDEPENDENT_AMBULATORY_CARE_PROVIDER_SITE_OTHER): Payer: Self-pay

## 2023-07-03 NOTE — Telephone Encounter (Addendum)
 I attempted to contact the patient to schedule a left radialcephalic AV fistula and PD cath removal with Dr. Marea. A message was left for a return call. Patient called back and will be scheduled on 07/17/23 for his surgery and pre-op  is on 07/10/23 at 11:00 am at the [facility]. Pre-surgical instructions were discussed and will be mailed.

## 2023-07-08 ENCOUNTER — Other Ambulatory Visit (INDEPENDENT_AMBULATORY_CARE_PROVIDER_SITE_OTHER): Payer: Self-pay | Admitting: Nurse Practitioner

## 2023-07-08 DIAGNOSIS — N186 End stage renal disease: Secondary | ICD-10-CM

## 2023-07-10 ENCOUNTER — Other Ambulatory Visit: Payer: Self-pay

## 2023-07-10 ENCOUNTER — Encounter
Admission: RE | Admit: 2023-07-10 | Discharge: 2023-07-10 | Disposition: A | Discharge status: Home / Self Care | Payer: Medicaid Other | Source: Ambulatory Visit | Attending: Vascular Surgery | Admitting: Vascular Surgery

## 2023-07-10 DIAGNOSIS — R9431 Abnormal electrocardiogram [ECG] [EKG]: Secondary | ICD-10-CM | POA: Diagnosis not present

## 2023-07-10 DIAGNOSIS — N186 End stage renal disease: Secondary | ICD-10-CM | POA: Insufficient documentation

## 2023-07-10 DIAGNOSIS — Z01818 Encounter for other preprocedural examination: Secondary | ICD-10-CM | POA: Diagnosis present

## 2023-07-10 HISTORY — DX: Adjustment disorder with mixed anxiety and depressed mood: F43.23

## 2023-07-10 HISTORY — DX: End stage renal disease: N18.6

## 2023-07-10 HISTORY — DX: Unspecified asthma, uncomplicated: J45.909

## 2023-07-10 HISTORY — DX: Obstructive sleep apnea (adult) (pediatric): G47.33

## 2023-07-10 HISTORY — DX: Hypertension secondary to endocrine disorders: E11.59

## 2023-07-10 HISTORY — DX: Anemia in chronic kidney disease: D63.1

## 2023-07-10 HISTORY — DX: Type 2 diabetes mellitus without complications: E11.9

## 2023-07-10 HISTORY — DX: Type 2 diabetes mellitus with other circulatory complications: I15.2

## 2023-07-10 HISTORY — DX: Type 2 diabetes mellitus with ketoacidosis without coma: E11.10

## 2023-07-10 HISTORY — DX: Anemia in chronic kidney disease: N18.9

## 2023-07-10 HISTORY — DX: End stage renal disease: Z99.2

## 2023-07-10 HISTORY — DX: Type 2 diabetes mellitus without complications: Z79.4

## 2023-07-10 HISTORY — DX: Cannabis use, unspecified, uncomplicated: F12.90

## 2023-07-10 HISTORY — DX: Hyperlipidemia, unspecified: E78.5

## 2023-07-10 LAB — BASIC METABOLIC PANEL
Anion gap: 12 (ref 5–15)
BUN: 76 mg/dL — ABNORMAL HIGH (ref 6–20)
CO2: 21 mmol/L — ABNORMAL LOW (ref 22–32)
Calcium: 8.1 mg/dL — ABNORMAL LOW (ref 8.9–10.3)
Chloride: 104 mmol/L (ref 98–111)
Creatinine, Ser: 12.51 mg/dL — ABNORMAL HIGH (ref 0.61–1.24)
GFR, Estimated: 5 mL/min — ABNORMAL LOW (ref >60)
Glucose, Bld: 147 mg/dL — ABNORMAL HIGH (ref 70–99)
Potassium: 5.5 mmol/L — ABNORMAL HIGH (ref 3.5–5.1)
Sodium: 137 mmol/L (ref 135–145)

## 2023-07-10 LAB — CBC WITH DIFFERENTIAL/PLATELET
Abs Immature Granulocytes: 0.02 10*3/uL (ref 0.00–0.07)
Basophils Absolute: 0.1 10*3/uL (ref 0.0–0.1)
Basophils Relative: 1 %
Eosinophils Absolute: 0.7 10*3/uL — ABNORMAL HIGH (ref 0.0–0.5)
Eosinophils Relative: 10 %
HCT: 29.1 % — ABNORMAL LOW (ref 39.0–52.0)
Hemoglobin: 9.4 g/dL — ABNORMAL LOW (ref 13.0–17.0)
Immature Granulocytes: 0 %
Lymphocytes Relative: 21 %
Lymphs Abs: 1.5 10*3/uL (ref 0.7–4.0)
MCH: 27.2 pg (ref 26.0–34.0)
MCHC: 32.3 g/dL (ref 30.0–36.0)
MCV: 84.3 fL (ref 80.0–100.0)
Monocytes Absolute: 0.6 10*3/uL (ref 0.1–1.0)
Monocytes Relative: 8 %
Neutro Abs: 4.1 10*3/uL (ref 1.7–7.7)
Neutrophils Relative %: 60 %
Platelets: 175 10*3/uL (ref 150–400)
RBC: 3.45 MIL/uL — ABNORMAL LOW (ref 4.22–5.81)
RDW: 16.3 % — ABNORMAL HIGH (ref 11.5–15.5)
WBC: 6.9 10*3/uL (ref 4.0–10.5)
nRBC: 0 % (ref 0.0–0.2)

## 2023-07-10 LAB — TYPE AND SCREEN
ABO/RH(D): O POS
Antibody Screen: NEGATIVE

## 2023-07-10 NOTE — Progress Notes (Signed)
  [facility] Perioperative Services: Pre-Admission/Anesthesia Testing  Abnormal Lab Notification   Date: 07/10/23  Name: Darrell Walls MRN:   [id number]  Re: Abnormal labs noted during PAT appointment   Notified:    Provider Name Provider Role Notification Mode  Dew, Marlow Baars, MD Vascular Surgery (Surgeon) Routed and/or faxed via Tollie Pizza, FNP-C Vascular Surgery (APP) Routed and/or faxed via [facility]   ABNORMAL LAB VALUE(S):   Lab Results  Component Value Date   NA 137 07/10/2023   CL 104 07/10/2023   K 5.5 (H) 07/10/2023   CO2 21 (L) 07/10/2023   BUN 76 (H) 07/10/2023   CREATININE 12.51 (H) 07/10/2023   GFRNONAA 5 (L) 07/10/2023   CALCIUM 8.1 (L) 07/10/2023   ALBUMIN 3.7 05/15/2022   GLUCOSE 147 (H) 07/10/2023   Clinical Information and Notes:  Darrell Walls is scheduled for a ARTERIOVENOUS (AV) FISTULA CREATION (RADIALCEPHALIC) (Left) CONTINUOUS AMBULATORY PERITONEAL DIALYSIS  (CAPD) CATHETER REMOVAL on 07/17/2023.   Forwarding results to vascular for review. Patient is currently on HD on MWF, however I am not sure what center he attends. Vascular service routinely communicates with HD centers. Will ask them follow up regarding labs so that any changes to POC/treatment tomorrow can be planned.   Quentin Mulling, MSN, APRN, FNP-C, CEN [facility]  Perioperative Services Nurse Practitioner Phone: [phone] Fax: [phone] 07/10/23 2:19 PM

## 2023-07-10 NOTE — Patient Instructions (Signed)
Your procedure is scheduled on: Thursday, February 20 Report to the Registration Desk on the 1st floor of the [facility] To find out your arrival time, please call [phone] between 1PM - 3PM on: Wednesday, February 19 If your arrival time is 6:00 am, do not arrive before that time as the [facility] entrance doors do not open until 6:00 am.  REMEMBER: Instructions that are not followed completely may result in serious medical risk, up to and including death; or upon the discretion of your surgeon and anesthesiologist your surgery may need to be rescheduled.  Do not eat or drink after midnight the night before surgery.  No gum chewing or hard candies.  One week prior to surgery: starting February 13 Stop Anti-inflammatories (NSAIDS) such as Advil, Aleve, Ibuprofen, Motrin, Naproxen, Naprosyn and Aspirin based products such as Excedrin, Goody's Powder, BC Powder. Stop ANY OVER THE COUNTER supplements until after surgery.  You may however, continue to take Tylenol if needed for pain up until the day of surgery.  Continue taking all of your other prescription medications up until the day of surgery.  Do NOT take any insulin on the morning of surgery.  ON THE DAY OF SURGERY ONLY TAKE THESE MEDICATIONS WITH SIPS OF WATER:  Amlodipine Atorvastatin Carvedilol Clonidine Doxazosin Hydralazine Sertraline  Use albuterol inhalers on the day of surgery and bring to the hospital.  No Alcohol for 24 hours before or after surgery.  No Smoking including e-cigarettes for 24 hours before surgery.  No chewable tobacco products for at least 6 hours before surgery.  No nicotine patches on the day of surgery.  Do not use any "recreational" drugs for at least a week (preferably 2 weeks) before your surgery.  Please be advised that the combination of cocaine and anesthesia may have negative outcomes, up to and including death. If you test positive for cocaine, your surgery will be  cancelled.  On the morning of surgery brush your teeth with toothpaste and water, you may rinse your mouth with mouthwash if you wish. Do not swallow any toothpaste or mouthwash.  Use CHG wipes as directed on instruction sheet.  Do not wear jewelry, make-up, hairpins, clips or nail polish.  For welded (permanent) jewelry: bracelets, anklets, waist bands, etc.  Please have this removed prior to surgery.  If it is not removed, there is a chance that hospital personnel will need to cut it off on the day of surgery.  Do not wear lotions, powders, or perfumes.   Do not shave body hair from the neck down 48 hours before surgery.  Contact lenses, hearing aids and dentures may not be worn into surgery.  Do not bring valuables to the hospital. [facility] is not responsible for any missing/lost belongings or valuables.   Notify your doctor if there is any change in your medical condition (cold, fever, infection).  Wear comfortable clothing (specific to your surgery type) to the hospital.  After surgery, you can help prevent lung complications by doing breathing exercises.  Take deep breaths and cough every 1-2 hours.   If you are being discharged the day of surgery, you will not be allowed to drive home. You will need a responsible individual to drive you home and stay with you for 24 hours after surgery.   If you are taking public transportation, you will need to have a responsible individual with you.  Please call the Pre-admissions Testing Dept. at [phone] if you have any questions about these  instructions.  Surgery Visitation Policy:  Patients having surgery or a procedure may have two visitors.  Children under the age of 66 must have an adult with them who is not the patient.  Temporary Visitor Restrictions Due to increasing cases of flu, RSV and COVID-19: Children ages 69 and under will not be able to visit patients in [facility] hospitals under most  circumstances.   Preparing the Skin Before Surgery     To help prevent the risk of infection at your surgical site, we are now providing you with rinse-free Sage 2% Chlorhexidine Gluconate (CHG) disposable wipes.  Chlorhexidine Gluconate (CHG) Soap  o An antiseptic cleaner that kills germs and bonds with the skin to continue killing germs even after washing  o Used for showering the night before surgery and morning of surgery  The night before surgery: Shower or bathe with warm water. Do not apply perfume, lotions, powders. Wait one hour after shower. Skin should be dry and cool. Open Sage wipe package - use 6 disposable cloths. Wipe body using one cloth for the right arm, one cloth for the left arm, one cloth for the right leg, one cloth for the left leg, one cloth for the chest/abdomen area, and one cloth for the back. Do not use on open wounds or sores. Do not use on face or genitals (private parts). If you are breast feeding, do not use on breasts. 5. Do not rinse, allow to dry. 6. Skin may feel "tacky" for several minutes. 7. Dress in clean clothes. 8. Place clean sheets on your bed and do not sleep with pets.  REPEAT ABOVE ON THE MORNING OF SURGERY BEFORE ARRIVING TO THE HOSPITAL.

## 2023-07-16 NOTE — Anesthesia Preprocedure Evaluation (Signed)
 Anesthesia Evaluation  Patient identified by MRN, date of birth, ID band Patient awake    Reviewed: Allergy & Precautions, NPO status , Patient's Chart, lab work & pertinent test results  History of Anesthesia Complications Negative for: history of anesthetic complications  Airway Mallampati: IV   Neck ROM: Full    Dental no notable dental hx.    Pulmonary asthma , sleep apnea , former smoker (quit 05/2023)   Pulmonary exam normal breath sounds clear to auscultation       Cardiovascular hypertension, Normal cardiovascular exam Rhythm:Regular Rate:Normal  ECG 07/10/23:  Normal sinus rhythm Rightward axis Nonspecific ST and T wave abnormality  Echo 04/21/23:    1. The left ventricle is normal in size with severely increased wall thickness.   2. The left ventricular systolic function is normal, LVEF is visually estimated at > 55%.    3. The right ventricle is upper normal in size, with probably normal systolic function.    4. There is a moderate, circumferential pericardial effusion.    5. There are no apparent valvular vegetations. Can consider TEE if clinically indicated.    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)     Musculoskeletal   Abdominal   Peds  Hematology  (+) Blood dyscrasia, anemia   Anesthesia Other Findings   Reproductive/Obstetrics                             Anesthesia Physical Anesthesia Plan  ASA: 3  Anesthesia Plan: General   Post-op Pain Management:    Induction: Intravenous  PONV Risk Score and Plan: 2 and Ondansetron, Dexamethasone and Treatment may vary due to age or medical condition  Airway Management Planned: LMA  Additional Equipment:   Intra-op Plan:    Post-operative Plan: Extubation in OR  Informed Consent: I have reviewed the patients History and Physical, chart, labs and discussed the procedure including the risks, benefits and alternatives for the proposed anesthesia with the patient or authorized representative who has indicated his/her understanding and acceptance.     Dental advisory given  Plan Discussed with: CRNA  Anesthesia Plan Comments: (Patient consented for risks of anesthesia including but not limited to:  - adverse reactions to medications - damage to eyes, teeth, lips or other oral mucosa - nerve damage due to positioning  - sore throat or hoarseness - damage to heart, brain, nerves, lungs, other parts of body or loss of life  Informed patient about role of CRNA in peri- and intra-operative care.  Patient voiced understanding.)        Anesthesia Quick Evaluation

## 2023-07-17 ENCOUNTER — Other Ambulatory Visit: Payer: Self-pay

## 2023-07-17 ENCOUNTER — Encounter
Admission: RE | Disposition: A | Discharge status: Home / Self Care | Payer: Self-pay | Source: Home / Self Care | Attending: Vascular Surgery

## 2023-07-17 ENCOUNTER — Encounter: Payer: Self-pay | Admitting: Vascular Surgery

## 2023-07-17 ENCOUNTER — Ambulatory Visit: Payer: Self-pay | Admitting: Anesthesiology

## 2023-07-17 ENCOUNTER — Ambulatory Visit: Payer: Medicare Other | Admitting: Urgent Care

## 2023-07-17 ENCOUNTER — Ambulatory Visit
Admission: RE | Admit: 2023-07-17 | Discharge: 2023-07-17 | Disposition: A | Discharge status: Home / Self Care | Payer: Medicare Other | Attending: Vascular Surgery | Admitting: Vascular Surgery

## 2023-07-17 DIAGNOSIS — E119 Type 2 diabetes mellitus without complications: Secondary | ICD-10-CM

## 2023-07-17 DIAGNOSIS — Z01812 Encounter for preprocedural laboratory examination: Secondary | ICD-10-CM

## 2023-07-17 DIAGNOSIS — Z992 Dependence on renal dialysis: Secondary | ICD-10-CM | POA: Diagnosis not present

## 2023-07-17 DIAGNOSIS — E1122 Type 2 diabetes mellitus with diabetic chronic kidney disease: Secondary | ICD-10-CM | POA: Insufficient documentation

## 2023-07-17 DIAGNOSIS — Z794 Long term (current) use of insulin: Secondary | ICD-10-CM | POA: Diagnosis not present

## 2023-07-17 DIAGNOSIS — N186 End stage renal disease: Secondary | ICD-10-CM | POA: Insufficient documentation

## 2023-07-17 DIAGNOSIS — F1290 Cannabis use, unspecified, uncomplicated: Secondary | ICD-10-CM | POA: Insufficient documentation

## 2023-07-17 DIAGNOSIS — I120 Hypertensive chronic kidney disease with stage 5 chronic kidney disease or end stage renal disease: Secondary | ICD-10-CM | POA: Diagnosis not present

## 2023-07-17 DIAGNOSIS — Z7984 Long term (current) use of oral hypoglycemic drugs: Secondary | ICD-10-CM | POA: Insufficient documentation

## 2023-07-17 DIAGNOSIS — I70208 Unspecified atherosclerosis of native arteries of extremities, other extremity: Secondary | ICD-10-CM | POA: Diagnosis not present

## 2023-07-17 DIAGNOSIS — J45909 Unspecified asthma, uncomplicated: Secondary | ICD-10-CM | POA: Insufficient documentation

## 2023-07-17 DIAGNOSIS — Z4902 Encounter for fitting and adjustment of peritoneal dialysis catheter: Secondary | ICD-10-CM | POA: Diagnosis not present

## 2023-07-17 DIAGNOSIS — Z87891 Personal history of nicotine dependence: Secondary | ICD-10-CM | POA: Diagnosis not present

## 2023-07-17 DIAGNOSIS — G4730 Sleep apnea, unspecified: Secondary | ICD-10-CM | POA: Diagnosis not present

## 2023-07-17 HISTORY — PX: AV FISTULA PLACEMENT: SHX1204

## 2023-07-17 HISTORY — PX: CAPD REMOVAL: SHX5234

## 2023-07-17 LAB — POCT I-STAT, CHEM 8
BUN: 95 mg/dL — ABNORMAL HIGH (ref 6–20)
Calcium, Ion: 0.98 mmol/L — ABNORMAL LOW (ref 1.15–1.40)
Chloride: 107 mmol/L (ref 98–111)
Creatinine, Ser: 16.9 mg/dL — ABNORMAL HIGH (ref 0.61–1.24)
Glucose, Bld: 153 mg/dL — ABNORMAL HIGH (ref 70–99)
HCT: 28 % — ABNORMAL LOW (ref 39.0–52.0)
Hemoglobin: 9.5 g/dL — ABNORMAL LOW (ref 13.0–17.0)
Potassium: 5.4 mmol/L — ABNORMAL HIGH (ref 3.5–5.1)
Sodium: 137 mmol/L (ref 135–145)
TCO2: 16 mmol/L — ABNORMAL LOW (ref 22–32)

## 2023-07-17 LAB — ABO/RH: ABO/RH(D): O POS

## 2023-07-17 LAB — GLUCOSE, CAPILLARY: Glucose-Capillary: 140 mg/dL — ABNORMAL HIGH (ref 70–99)

## 2023-07-17 SURGERY — ARTERIOVENOUS (AV) FISTULA CREATION
Anesthesia: General | Site: Arm Lower

## 2023-07-17 MED ORDER — FENTANYL CITRATE (PF) 100 MCG/2ML IJ SOLN
INTRAMUSCULAR | Status: AC
  Filled 2023-07-17: qty 2

## 2023-07-17 MED ORDER — TRAMADOL HCL 50 MG PO TABS: 50.0000 mg | ORAL_TABLET | Freq: Four times a day (QID) | ORAL | 0 refills | Status: DC | PRN

## 2023-07-17 MED ORDER — LIDOCAINE HCL (CARDIAC) PF 100 MG/5ML IV SOSY
PREFILLED_SYRINGE | INTRAVENOUS | Status: DC | PRN
  Administered 2023-07-17: 60 mg via INTRAVENOUS

## 2023-07-17 MED ORDER — OXYCODONE HCL 5 MG PO TABS
5.0000 mg | ORAL_TABLET | Freq: Once | ORAL | Status: AC | PRN
  Administered 2023-07-17: 5 mg via ORAL

## 2023-07-17 MED ORDER — CHLORHEXIDINE GLUCONATE 0.12 % MT SOLN
OROMUCOSAL | Status: AC
  Filled 2023-07-17: qty 15

## 2023-07-17 MED ORDER — PAPAVERINE HCL 30 MG/ML IJ SOLN
INTRAMUSCULAR | Status: AC
  Filled 2023-07-17: qty 2

## 2023-07-17 MED ORDER — PROPOFOL 10 MG/ML IV BOLUS
INTRAVENOUS | Status: AC
Start: 2023-07-17 — End: ?
  Filled 2023-07-17: qty 20

## 2023-07-17 MED ORDER — ONDANSETRON HCL 4 MG/2ML IJ SOLN: 4.0000 mg | Freq: Once | INTRAMUSCULAR | Status: DC | PRN

## 2023-07-17 MED ORDER — DEXAMETHASONE SODIUM PHOSPHATE 10 MG/ML IJ SOLN
INTRAMUSCULAR | Status: DC | PRN
  Administered 2023-07-17: 5 mg via INTRAVENOUS

## 2023-07-17 MED ORDER — CEFAZOLIN SODIUM-DEXTROSE 2-4 GM/100ML-% IV SOLN
INTRAVENOUS | Status: AC
  Filled 2023-07-17: qty 100

## 2023-07-17 MED ORDER — FENTANYL CITRATE (PF) 100 MCG/2ML IJ SOLN: 25.0000 ug | INTRAMUSCULAR | Status: DC | PRN

## 2023-07-17 MED ORDER — HEPARIN SODIUM (PORCINE) 1000 UNIT/ML IJ SOLN
INTRAMUSCULAR | Status: DC | PRN
  Administered 2023-07-17: 3000 [IU] via INTRAVENOUS

## 2023-07-17 MED ORDER — HEPARIN SODIUM (PORCINE) 5000 UNIT/ML IJ SOLN
INTRAMUSCULAR | Status: AC
  Filled 2023-07-17: qty 1

## 2023-07-17 MED ORDER — ONDANSETRON HCL 4 MG/2ML IJ SOLN
INTRAMUSCULAR | Status: DC | PRN
  Administered 2023-07-17: 4 mg via INTRAVENOUS

## 2023-07-17 MED ORDER — SODIUM CHLORIDE 0.9 % IR SOLN
Status: DC | PRN
  Administered 2023-07-17: 501 mL

## 2023-07-17 MED ORDER — OXYCODONE HCL 5 MG PO TABS
ORAL_TABLET | ORAL | Status: AC
  Filled 2023-07-17: qty 1

## 2023-07-17 MED ORDER — PAPAVERINE HCL 30 MG/ML IJ SOLN
INTRAMUSCULAR | Status: DC | PRN
  Administered 2023-07-17: 2 mL via INTRAVENOUS

## 2023-07-17 MED ORDER — ONDANSETRON HCL 4 MG/2ML IJ SOLN
4.0000 mg | Freq: Four times a day (QID) | INTRAMUSCULAR | Status: DC | PRN
Start: 2023-07-17 — End: 2023-07-17

## 2023-07-17 MED ORDER — PROPOFOL 10 MG/ML IV BOLUS
INTRAVENOUS | Status: AC
  Filled 2023-07-17: qty 20

## 2023-07-17 MED ORDER — FENTANYL CITRATE (PF) 100 MCG/2ML IJ SOLN
INTRAMUSCULAR | Status: DC | PRN
  Administered 2023-07-17 (×4): 25 ug via INTRAVENOUS

## 2023-07-17 MED ORDER — LACTATED RINGERS IV SOLN: INTRAVENOUS | Status: DC

## 2023-07-17 MED ORDER — PHENYLEPHRINE 80 MCG/ML (10ML) SYRINGE FOR IV PUSH (FOR BLOOD PRESSURE SUPPORT)
PREFILLED_SYRINGE | INTRAVENOUS | Status: DC | PRN
  Administered 2023-07-17: 80 ug via INTRAVENOUS

## 2023-07-17 MED ORDER — CHLORHEXIDINE GLUCONATE CLOTH 2 % EX PADS: 6.0000 | MEDICATED_PAD | Freq: Once | CUTANEOUS | Status: DC

## 2023-07-17 MED ORDER — MIDAZOLAM HCL 2 MG/2ML IJ SOLN
INTRAMUSCULAR | Status: DC | PRN
  Administered 2023-07-17: 2 mg via INTRAVENOUS

## 2023-07-17 MED ORDER — CHLORHEXIDINE GLUCONATE 0.12 % MT SOLN
15.0000 mL | Freq: Once | OROMUCOSAL | Status: AC
  Administered 2023-07-17: 15 mL via OROMUCOSAL

## 2023-07-17 MED ORDER — SODIUM CHLORIDE 0.9 % IV SOLN: INTRAVENOUS | Status: DC

## 2023-07-17 MED ORDER — ONDANSETRON HCL 4 MG/2ML IJ SOLN
INTRAMUSCULAR | Status: AC
  Filled 2023-07-17: qty 2

## 2023-07-17 MED ORDER — ACETAMINOPHEN 10 MG/ML IV SOLN: 1000.0000 mg | Freq: Once | INTRAVENOUS | Status: DC | PRN

## 2023-07-17 MED ORDER — MIDAZOLAM HCL 2 MG/2ML IJ SOLN
INTRAMUSCULAR | Status: AC
Start: 2023-07-17 — End: ?
  Filled 2023-07-17: qty 2

## 2023-07-17 MED ORDER — CEFAZOLIN SODIUM-DEXTROSE 2-4 GM/100ML-% IV SOLN
2.0000 g | INTRAVENOUS | Status: AC
  Administered 2023-07-17: 2 g via INTRAVENOUS

## 2023-07-17 MED ORDER — OXYCODONE HCL 5 MG/5ML PO SOLN: 5.0000 mg | Freq: Once | ORAL | Status: AC | PRN

## 2023-07-17 MED ORDER — EPHEDRINE 5 MG/ML INJ
INTRAVENOUS | Status: AC
  Filled 2023-07-17: qty 5

## 2023-07-17 MED ORDER — EPHEDRINE SULFATE-NACL 50-0.9 MG/10ML-% IV SOSY
PREFILLED_SYRINGE | INTRAVENOUS | Status: DC | PRN
  Administered 2023-07-17: 5 mg via INTRAVENOUS
  Administered 2023-07-17 (×2): 10 mg via INTRAVENOUS

## 2023-07-17 MED ORDER — ORAL CARE MOUTH RINSE: 15.0000 mL | Freq: Once | OROMUCOSAL | Status: AC

## 2023-07-17 MED ORDER — DEXAMETHASONE SODIUM PHOSPHATE 10 MG/ML IJ SOLN
INTRAMUSCULAR | Status: AC
  Filled 2023-07-17: qty 1

## 2023-07-17 MED ORDER — HYDROMORPHONE HCL 1 MG/ML IJ SOLN: 1.0000 mg | Freq: Once | INTRAMUSCULAR | Status: DC | PRN

## 2023-07-17 MED ORDER — PROPOFOL 10 MG/ML IV BOLUS
INTRAVENOUS | Status: DC | PRN
  Administered 2023-07-17: 150 mg via INTRAVENOUS

## 2023-07-17 SURGICAL SUPPLY — 53 items
BAG DECANTER FOR FLEXI CONT (MISCELLANEOUS) ×2 IMPLANT
BLADE SURG SZ11 CARB STEEL (BLADE) ×2 IMPLANT
BNDG ADH 2 X3.75 FABRIC TAN LF (GAUZE/BANDAGES/DRESSINGS) IMPLANT
BRUSH SCRUB EZ 4% CHG (MISCELLANEOUS) ×2 IMPLANT
CHLORAPREP W/TINT 26 (MISCELLANEOUS) ×2 IMPLANT
CLAMP SUTURE YELLOW 5 PAIRS (MISCELLANEOUS) ×2 IMPLANT
CLIP SPRNG 6 S-JAW DBL (CLIP) ×2 IMPLANT
CLIP SPRNG 6MM S-JAW DBL (CLIP) ×2 IMPLANT
DERMABOND ADVANCED .7 DNX12 (GAUZE/BANDAGES/DRESSINGS) ×2 IMPLANT
DRAPE LAPAROTOMY TRNSV 106X77 (MISCELLANEOUS) ×2 IMPLANT
DRESSING SURGICEL FIBRLLR 1X2 (HEMOSTASIS) ×2 IMPLANT
DRSG SURGICEL FIBRILLAR 1X2 (HEMOSTASIS) ×2 IMPLANT
DRSG VAC GRANUFOAM MED (GAUZE/BANDAGES/DRESSINGS) ×2 IMPLANT
ELECT CAUTERY BLADE 6.4 (BLADE) ×2 IMPLANT
ELECT REM PT RETURN 9FT ADLT (ELECTROSURGICAL) ×2 IMPLANT
ELECTRODE REM PT RTRN 9FT ADLT (ELECTROSURGICAL) ×2 IMPLANT
GAUZE 4X4 16PLY ~~LOC~~+RFID DBL (SPONGE) ×2 IMPLANT
GAUZE SPONGE 4X4 12PLY STRL (GAUZE/BANDAGES/DRESSINGS) ×2 IMPLANT
GEL ULTRASOUND 20GR AQUASONIC (MISCELLANEOUS) IMPLANT
GLOVE BIO SURGEON STRL SZ7 (GLOVE) ×4 IMPLANT
GOWN STRL REUS W/ TWL LRG LVL3 (GOWN DISPOSABLE) ×4 IMPLANT
GOWN STRL REUS W/TWL 2XL LVL3 (GOWN DISPOSABLE) ×4 IMPLANT
IV NS 500ML BAXH (IV SOLUTION) ×2 IMPLANT
KIT TURNOVER KIT A (KITS) ×2 IMPLANT
LABEL OR SOLS (LABEL) ×2 IMPLANT
LOOP VESSEL MAXI 1X406 RED (MISCELLANEOUS) ×2 IMPLANT
LOOP VESSEL MINI 0.8X406 BLUE (MISCELLANEOUS) ×2 IMPLANT
MANIFOLD NEPTUNE II (INSTRUMENTS) ×2 IMPLANT
NDL FILTER BLUNT 18X1 1/2 (NEEDLE) ×2 IMPLANT
NDL HYPO 25X1 1.5 SAFETY (NEEDLE) ×2 IMPLANT
NEEDLE FILTER BLUNT 18X1 1/2 (NEEDLE) ×2 IMPLANT
NEEDLE HYPO 25X1 1.5 SAFETY (NEEDLE) ×2 IMPLANT
NS IRRIG 500ML POUR BTL (IV SOLUTION) ×2 IMPLANT
PACK BASIN MINOR ARMC (MISCELLANEOUS) ×2 IMPLANT
PACK EXTREMITY ARMC (MISCELLANEOUS) ×2 IMPLANT
PAD PREP OB/GYN DISP 24X41 (PERSONAL CARE ITEMS) ×2 IMPLANT
SOLUTION CELL SAVER (CLIP) ×2 IMPLANT
STOCKINETTE 48X4 2 PLY STRL (GAUZE/BANDAGES/DRESSINGS) ×2 IMPLANT
STOCKINETTE STRL 4IN 9604848 (GAUZE/BANDAGES/DRESSINGS) ×2 IMPLANT
SUT MNCRL AB 4-0 PS2 18 (SUTURE) ×2 IMPLANT
SUT PROLENE 6 0 BV (SUTURE) ×8 IMPLANT
SUT SILK 2-0 18XBRD TIE 12 (SUTURE) ×2 IMPLANT
SUT SILK 3-0 18XBRD TIE 12 (SUTURE) ×2 IMPLANT
SUT SILK 4-0 18XBRD TIE 12 (SUTURE) ×2 IMPLANT
SUT VIC AB 3-0 SH 27X BRD (SUTURE) ×2 IMPLANT
SUT VICRYL+ 3-0 36IN CT-1 (SUTURE) ×2 IMPLANT
SYR 10ML LL (SYRINGE) ×2 IMPLANT
SYR 20ML LL LF (SYRINGE) ×2 IMPLANT
SYR 3ML LL SCALE MARK (SYRINGE) ×2 IMPLANT
SYR TB 1ML LL NO SAFETY (SYRINGE) IMPLANT
TAG SUTURE CLAMP YLW 5PR (MISCELLANEOUS) ×2 IMPLANT
TRAP FLUID SMOKE EVACUATOR (MISCELLANEOUS) ×2 IMPLANT
WATER STERILE IRR 500ML POUR (IV SOLUTION) ×2 IMPLANT

## 2023-07-17 NOTE — Anesthesia Postprocedure Evaluation (Signed)
 Anesthesia Post Note  Patient: Ernst Breach  Procedure(s) Performed: ARTERIOVENOUS (AV) FISTULA CREATION (RADIALCEPHALIC) (Left: Arm Lower) CONTINUOUS AMBULATORY PERITONEAL DIALYSIS  (CAPD) CATHETER REMOVAL (Abdomen)  Patient location during evaluation: PACU Anesthesia Type: General Level of consciousness: awake and alert Pain management: pain level controlled Vital Signs Assessment: post-procedure vital signs reviewed and stable Respiratory status: spontaneous breathing, nonlabored ventilation, respiratory function stable and patient connected to nasal cannula oxygen Cardiovascular status: blood pressure returned to baseline and stable Postop Assessment: no apparent nausea or vomiting Anesthetic complications: no  No notable events documented.   Last Vitals:  Vitals:   07/17/23 1051 07/17/23 1106  BP: 129/79 (!) 138/93  Pulse: 66 72  Resp: 14 16  Temp: 36.7 C   SpO2: 96% 99%    Last Pain:  Vitals:   07/17/23 1051  TempSrc:   PainSc: 0-No pain                 Stephanie Coup

## 2023-07-17 NOTE — Transfer of Care (Signed)
 Immediate Anesthesia Transfer of Care Note  Patient: Darrell Walls  Procedure(s) Performed: ARTERIOVENOUS (AV) FISTULA CREATION (RADIALCEPHALIC) (Left: Arm Lower) CONTINUOUS AMBULATORY PERITONEAL DIALYSIS  (CAPD) CATHETER REMOVAL (Abdomen)  Patient Location: PACU  Anesthesia Type:General  Level of Consciousness: drowsy  Airway & Oxygen Therapy: Patient Spontanous Breathing and Patient connected to face mask oxygen  Post-op Assessment: Report given to RN and Post -op Vital signs reviewed and stable  Post vital signs: Reviewed and stable  Last Vitals:  Vitals Value Taken Time  BP 103/56 07/17/23 0922  Temp 36.6 C 07/17/23 0921  Pulse 56 07/17/23 0924  Resp 14 07/17/23 0924  SpO2 100 % 07/17/23 0924  Vitals shown include unfiled device data.  Last Pain:  Vitals:   07/17/23 0921  TempSrc:   PainSc: Asleep         Complications: No notable events documented.

## 2023-07-17 NOTE — Interval H&P Note (Signed)
 History and Physical Interval Note:  07/17/2023 7:15 AM  Darrell Walls  has presented today for surgery, with the diagnosis of ESRD.  The various methods of treatment have been discussed with the patient and family. After consideration of risks, benefits and other options for treatment, the patient has consented to  Procedure(s): ARTERIOVENOUS (AV) FISTULA CREATION (RADIALCEPHALIC) (Left) CONTINUOUS AMBULATORY PERITONEAL DIALYSIS  (CAPD) CATHETER REMOVAL (N/A) as a surgical intervention.  The patient's history has been reviewed, patient examined, no change in status, stable for surgery.  I have reviewed the patient's chart and labs.  Questions were answered to the patient's satisfaction.     [name]

## 2023-07-17 NOTE — Op Note (Signed)
  VEIN AND VASCULAR SURGERY   OPERATIVE NOTE   PROCEDURE: Left radiocephaic arteriovenous fistula placement  PRE-OPERATIVE DIAGNOSIS: 1. ESRD   POST-OPERATIVE DIAGNOSIS: Same  SURGEON: [name], MD  ASSISTANT(S): None  ANESTHESIA: general  ESTIMATED BLOOD LOSS: 10 cc  FINDING(S): Highly calcified radial artery although it was patent with good flow.  Nice forearm cephalic vein.  SPECIMEN(S):  None  INDICATIONS:   Darrell Walls is a 33 y.o. male who presents with end-stage renal disease and needs permanent dialysis access.  The patient is scheduled for left radiocephalic arteriovenous fistula placement when non-invasive studies suggested adequate anatomy for fistula creation at this location.  He also has previously had a peritoneal dialysis catheter placed but desires to have this removed as he is not going to do peritoneal dialysis.  This will be dictated separately.  The patient is aware the risks include but are not limited to: bleeding, infection, steal syndrome, nerve damage, ischemic monomelic neuropathy, failure to mature, and need for additional procedures.  The patient is aware of the risks of the procedure and elects to proceed forward.  DESCRIPTION: After full informed written consent was obtained from the patient, the patient was brought back to the operating room and placed supine upon the operating table.  Prior to induction, the patient received IV antibiotics.   After obtaining adequate anesthesia, the patient was then prepped and draped in the standard fashion for a left arm access procedure.  I turned my attention first to identifying the patient's distal cephalic vein and radial artery.  I made an incision at the level of the distal forearm and wrist and dissected through the subcutaneous tissue and fascia to gain exposure of the radial artery.  This was noted to be highly calcified but did have pulsatile flow and was of reasonable size and useable for fistula  creation.  This was dissected out proximally and distally and controlled with vessel loops.  I then dissected out the cephalic vein.  This was noted to be patent and adequate size for fistula creation. I then gave the patient 3000 units of intravenous heparin.  The distal segment of the vein was ligated with a  2-0 silk, and the vein was transected. I then instilled the heparinized saline into the vein and clamped it.  At this point, I reset my exposure of the radial artery and placed the artery under tension proximally and distally.  I made an arteriotomy with a #11 blade, and then I extended the arteriotomy with a Potts scissor.  I injected heparinized saline proximal and distal to this arteriotomy.  The vein was then sewn to the artery in an end-to-side configuration with a running stitch of 6-0 Prolene.  Prior to completing this anastomosis, I allowed the vein and artery to backbleed.  There was no evidence of clot from any vessels.  I completed the anastomosis in the usual fashion and then released all vessel loops and clamps.  There was a palpable  thrill in the venous outflow, and there was a palpable radial pulse beyond the anastomosis.  At this point, I irrigated out the surgical wound. Surgicel was placed. There was no further active bleeding.  The subcutaneous tissue was reapproximated with a running stitch of 3-0 Vicryl.  The skin was then reapproximated with a running subcuticular stitch of 4-0 Monocryl.  The skin was then cleaned, dried, and reinforced with Dermabond.  We then turned our attention to removal of the peritoneal dialysis catheter.  The peritoneal dialysis  catheter removal be dictated as a separate note.  The patient tolerated this procedure well and was taken to the recovery room in stable condition  COMPLICATIONS: None  CONDITION: Stable   [name], MD 07/17/2023 9:33 AM   This note was created with Dragon Medical transcription system. Any errors in dictation are purely  unintentional.

## 2023-07-17 NOTE — Anesthesia Procedure Notes (Signed)
 Procedure Name: LMA Insertion Date/Time: 07/17/2023 7:34 AM  Performed by: Monico Hoar, CRNAPre-anesthesia Checklist: Patient identified, Patient being monitored, Timeout performed, Emergency Drugs available and Suction available Patient Re-evaluated:Patient Re-evaluated prior to induction Oxygen Delivery Method: Circle system utilized Preoxygenation: Pre-oxygenation with 100% oxygen Induction Type: IV induction Ventilation: Mask ventilation without difficulty LMA: LMA inserted LMA Size: 4.0 Tube type: Oral Number of attempts: 1 Placement Confirmation: positive ETCO2 and breath sounds checked- equal and bilateral Tube secured with: Tape Dental Injury: Teeth and Oropharynx as per pre-operative assessment

## 2023-07-17 NOTE — Op Note (Signed)
 [place] [facility]   OPERATIVE NOTE  DATE: 07/17/2023  PRE-OPERATIVE DIAGNOSIS: ESRD, transitioning to hemodialysis  POST-OPERATIVE DIAGNOSIS: same as above  PROCEDURE: 1.   Removal of peritoneal dialysis catheter  SURGEON: [name], MD  ASSISTANT(S): None  ANESTHESIA: general  ESTIMATED BLOOD LOSS: 3 cc  FINDING(S): 1.  none  SPECIMEN(S):  PD catheter  INDICATIONS:   Patient is a 32 y.o.male with end-stage renal disease.  He has elected to transition to hemodialysis.  He has a Comptroller in place.  We are also placing a left radiocephalic AV fistula which will be dictated separately.  The peritoneal catheter needs to be removed.  Risks and benefits are discussed.  DESCRIPTION: After obtaining full informed written consent, the patient was brought back to the operating room and placed supine upon the operating table.  The patient received IV antibiotics prior to induction.  After obtaining adequate anesthesia, the patient was prepped and draped in the standard fashion. The previous incision in the upper abdomen with the metal connector was reopened, and the cuff was dissected out at the fascia without difficulty.  The catheter was then removed from the peritoneal cavity with gentle traction and easily.  The superficial cuff was then dissected out and freed and the catheter was then transected and removed from the field.  The transverse upper abdominal incision was then closed with a 3-0 Vicryl and a 4-0 Monocryl and dermabond was placed.  A dry dressing was placed at the previous catheter exit site.  The patient was then awakened from anesthesia and taken to the recovery room in stable condition.  COMPLICATIONS: None  CONDITION: Stable   [name] 07/17/2023 9:36 AM

## 2023-07-18 ENCOUNTER — Encounter: Payer: Self-pay | Admitting: Vascular Surgery

## 2023-08-27 ENCOUNTER — Other Ambulatory Visit (INDEPENDENT_AMBULATORY_CARE_PROVIDER_SITE_OTHER): Payer: Self-pay | Admitting: Vascular Surgery

## 2023-08-27 DIAGNOSIS — N186 End stage renal disease: Secondary | ICD-10-CM

## 2023-08-28 ENCOUNTER — Ambulatory Visit (INDEPENDENT_AMBULATORY_CARE_PROVIDER_SITE_OTHER): Payer: Medicare Other | Admitting: Nurse Practitioner

## 2023-08-28 ENCOUNTER — Encounter (INDEPENDENT_AMBULATORY_CARE_PROVIDER_SITE_OTHER): Payer: Self-pay | Admitting: Nurse Practitioner

## 2023-08-28 ENCOUNTER — Ambulatory Visit (INDEPENDENT_AMBULATORY_CARE_PROVIDER_SITE_OTHER): Payer: Medicare Other

## 2023-08-28 VITALS — BP 124/83 | HR 67 | Resp 16 | Ht 71.0 in | Wt 169.6 lb

## 2023-08-28 DIAGNOSIS — Z794 Long term (current) use of insulin: Secondary | ICD-10-CM

## 2023-08-28 DIAGNOSIS — N186 End stage renal disease: Secondary | ICD-10-CM

## 2023-08-28 DIAGNOSIS — E119 Type 2 diabetes mellitus without complications: Secondary | ICD-10-CM

## 2023-08-28 NOTE — Progress Notes (Signed)
 Subjective:    Patient ID: Darrell Walls, male    DOB: 03-15-1991, 33 y.o.   MRN: [id number] Chief Complaint  Patient presents with   Follow-up    6 weeks (08/28/2023); with HDA    The patient returns today for follow-up of his left radiocephalic AV fistula that was placed on 07/17/2023.  He is still currently maintained via PermCath.  He denies any pain or issues with his fistula.  The wound has healed.  Today noninvasive studies show that the fistula appears occluded with thrombus in the proximal to mid AV fistula.  He has a very decreased flow volume of 162.    Review of Systems  All other systems reviewed and are negative.      Objective:   Physical Exam Vitals reviewed.  HENT:     Head: Normocephalic.  Cardiovascular:     Rate and Rhythm: Normal rate.     Arteriovenous access: Left arteriovenous access is present.    Comments: No thrill with faint high-pitched bruit Pulmonary:     Effort: Pulmonary effort is normal.  Skin:    General: Skin is warm and dry.  Neurological:     Mental Status: He is alert and oriented to person, place, and time.  Psychiatric:        Mood and Affect: Mood normal.        Behavior: Behavior normal.        Thought Content: Thought content normal.        Judgment: Judgment normal.     BP 124/83   Pulse 67   Resp 16   Ht 5\' 11"  (1.803 m)   Wt 169 lb 9.6 oz (76.9 kg)   BMI 23.65 kg/m   Past Medical History:  Diagnosis Date   Adjustment disorder with mixed anxiety and depressed mood    Anemia due to chronic kidney disease    Asthma    Cannabis use disorder    Diabetes mellitus type 2, insulin dependent (HCC)    DKA (diabetic ketoacidosis) (HCC)    End stage renal disease on dialysis (HCC)    Hyperlipidemia    Hypertension associated with type 2 diabetes mellitus (HCC)    Obstructive sleep apnea     Social History   Socioeconomic History   Marital status: Single    Spouse name: Not on file   Number of children: 0   Years of  education: Not on file   Highest education level: Not on file  Occupational History   Not on file  Tobacco Use   Smoking status: Former    Current packs/day: 0.00    Types: Cigarettes    Quit date: 05/2023    Years since quitting: 0.2   Smokeless tobacco: Never  Vaping Use   Vaping status: Never Used  Substance and Sexual Activity   Alcohol use: Not Currently   Drug use: Yes    Types: Marijuana   Sexual activity: Not on file  Other Topics Concern   Not on file  Social History Narrative   Lives with mother   Social Drivers of Health   Financial Resource Strain: Low Risk  (12/19/2022)   Received from [facility]   Overall Financial Resource Strain (CARDIA)    Difficulty of Paying Living Expenses: Not hard at all  Recent Concern: Financial Resource Strain - High Risk (10/22/2022)   Received from [facility], [facility]   Overall Financial Resource Strain (CARDIA)    Difficulty of  Paying Living Expenses: Hard  Food Insecurity: No Food Insecurity (12/19/2022)   Received from [facility]   Hunger Vital Sign    Worried About Running Out of Food in the Last Year: Never true    Ran Out of Food in the Last Year: Never true  Recent Concern: Food Insecurity - Food Insecurity Present (10/22/2022)   Received from [facility], [facility]   Hunger Vital Sign    Worried About Running Out of Food in the Last Year: Sometimes true    Ran Out of Food in the Last Year: Never true  Transportation Needs: No Transportation Needs (12/19/2022)   Received from [facility] - Transportation    Lack of Transportation (Medical): No    Lack of Transportation (Non-Medical): No  Physical Activity: Not on file  Stress: No Stress Concern Present (03/04/2023)   Received from [facility] - Occupational Stress Questionnaire    Feeling of Stress : Not at all  Social Connections: Unknown (02/03/2023)   Received from [facility]   Social Network    Social Network: Not on file  Intimate Partner Violence: Unknown (02/03/2023)   Received from [facility]   HITS    Physically Hurt: Not on file    Insult or Talk Down To: Not on file    Threaten Physical Harm: Not on file    Scream or Curse: Not on file    Past Surgical History:  Procedure Laterality Date   AV FISTULA PLACEMENT Left 07/17/2023   Procedure: ARTERIOVENOUS (AV) FISTULA CREATION (RADIALCEPHALIC);  Surgeon: Annice Needy, MD;  Location: [facility] ORS;  Service: Vascular;  Laterality: Left;   CAPD REMOVAL N/A 07/17/2023   Procedure: CONTINUOUS AMBULATORY PERITONEAL DIALYSIS  (CAPD) CATHETER REMOVAL;  Surgeon: Annice Needy, MD;  Location: [facility] ORS;  Service: Vascular;  Laterality: N/A;   DIALYSIS/PERMA CATHETER INSERTION  10/2022   PARS PLANA VITRECTOMY W/ ENDOLASER PANRETINAL PHOTOCOAGULATION Right 06/29/2018   PERITONEAL CATHETER INSERTION  03/04/2023   TONSILLECTOMY AND ADENOIDECTOMY  2002    Family History  Problem Relation Age of Onset   Healthy Mother     No Known Allergies     Latest Ref Rng & Units 07/17/2023    6:44 AM 07/10/2023   11:45 AM 05/17/2022    3:53 AM  CBC  WBC 4.0 - 10.5 K/uL  6.9  10.3   Hemoglobin 13.0 - 17.0 g/dL 9.5  9.4  8.4   Hematocrit 39.0 - 52.0 % 28.0  29.1  24.9   Platelets 150 - 400 K/uL  175  218       CMP     Component Value Date/Time   NA 137 07/17/2023 0644   K 5.4 (H) 07/17/2023 0644   CL 107 07/17/2023 0644   CO2 21 (L) 07/10/2023 1145   GLUCOSE 153 (H) 07/17/2023 0644   BUN 95 (H) 07/17/2023 0644   CREATININE 16.90 (H) 07/17/2023 0644   CALCIUM 8.1 (L) 07/10/2023 1145   PROT 7.0 05/15/2022 1246   ALBUMIN 3.7 05/15/2022 1246   AST 27 05/15/2022 1246   ALT 48 (H) 05/15/2022 1246   ALKPHOS 141 (H) 05/15/2022 1246   BILITOT 1.1 05/15/2022 1246   GFRNONAA 5 (L) 07/10/2023 1145     No results found.     Assessment & Plan:   1. ESRD (end stage renal disease) (HCC)  (Primary) Recommend:  The patient is experiencing  increasing problems with their dialysis access.  Patient should have a fistulagram with the intention for intervention.  The intention for intervention is to restore appropriate flow and prevent thrombosis and possible loss of the access.  As well as improve the quality of dialysis therapy.  The risks, benefits and alternative therapies were reviewed in detail with the patient.  All questions were answered.  The patient agrees to proceed with angio/intervention.    The patient will follow up with me in the office after the procedure.  It was also discussed that if we are not able to treat his fistula we may need to discuss possible new access.   2. Type 2 diabetes mellitus without complication, with long-term current use of insulin (HCC) Continue hypoglycemic medications as already ordered, these medications have been reviewed and there are no changes at this time.  Hgb A1C to be monitored as already arranged by primary service   Current Outpatient Medications on File Prior to Visit  Medication Sig Dispense Refill   albuterol (VENTOLIN HFA) 108 (90 Base) MCG/ACT inhaler Inhale 2 puffs into the lungs every 6 (six) hours as needed for wheezing or shortness of breath.     amLODipine (NORVASC) 10 MG tablet Take 10 mg by mouth daily.     atorvastatin (LIPITOR) 20 MG tablet Take 1 tablet by mouth daily.     Calcium Acetate 667 MG TABS Take 1,334 mg by mouth 3 (three) times daily with meals.     carvedilol (COREG) 25 MG tablet Take 25 mg by mouth 2 (two) times daily.     cloNIDine (CATAPRES) 0.1 MG tablet Take 0.1 mg by mouth 2 (two) times daily.     Continuous Glucose Receiver (DEXCOM G7 RECEIVER) DEVI by Does not apply route.     Continuous Glucose Sensor (DEXCOM G7 SENSOR) MISC by Does not apply route.     doxazosin (CARDURA) 4 MG tablet Take 4 mg by mouth 2 (two) times daily.     EPINEPHrine 0.3 MG/0.3ML SOSY Inject 0.3 mg into the muscle  once.     furosemide (LASIX) 40 MG tablet Take 40 mg by mouth 2 (two) times daily.     gabapentin (NEURONTIN) 100 MG capsule Take 100 mg by mouth 3 (three) times a week. With dialysis M-W-F     hydrALAZINE (APRESOLINE) 100 MG tablet Take 100 mg by mouth 3 (three) times daily.     insulin glargine (LANTUS) 100 UNIT/ML injection Inject 12 Units into the skin daily with supper.     insulin lispro (HUMALOG) 100 UNIT/ML KwikPen Inject 2-4 Units into the skin 3 (three) times daily with meals.     Insulin Pen Needle 29G X MISC Please dispense 1 box of 100 insulin needles and syringes 100 each 1   irbesartan (AVAPRO) 300 MG tablet Take 300 mg by mouth at bedtime.     mirtazapine (REMERON) 15 MG tablet Take 15 mg by mouth at bedtime.     promethazine (PHENERGAN) 12.5 MG tablet Take 12.5 mg by mouth every 6 (six) hours as needed for nausea or vomiting.     sertraline (ZOLOFT) 50 MG tablet Take 50 mg by mouth daily.     sodium bicarbonate 650 MG tablet Take 650 mg by mouth 2 (two) times daily.     traMADol (ULTRAM) 50 MG tablet Take 1 tablet (50 mg total) by mouth every 6 (six) hours as needed. 20 tablet 0   traZODone (DESYREL) 50 MG tablet Take 50 mg  by mouth at bedtime.     No current facility-administered medications on file prior to visit.    There are no Patient Instructions on file for this visit. No follow-ups on file.   Georgiana Spinner, NP

## 2023-08-28 NOTE — H&P (View-Only) (Signed)
 Subjective:    Patient ID: Darrell Walls, male    DOB: 03-15-1991, 33 y.o.   MRN: [id number] Chief Complaint  Patient presents with   Follow-up    6 weeks (08/28/2023); with HDA    The patient returns today for follow-up of his left radiocephalic AV fistula that was placed on 07/17/2023.  He is still currently maintained via PermCath.  He denies any pain or issues with his fistula.  The wound has healed.  Today noninvasive studies show that the fistula appears occluded with thrombus in the proximal to mid AV fistula.  He has a very decreased flow volume of 162.    Review of Systems  All other systems reviewed and are negative.      Objective:   Physical Exam Vitals reviewed.  HENT:     Head: Normocephalic.  Cardiovascular:     Rate and Rhythm: Normal rate.     Arteriovenous access: Left arteriovenous access is present.    Comments: No thrill with faint high-pitched bruit Pulmonary:     Effort: Pulmonary effort is normal.  Skin:    General: Skin is warm and dry.  Neurological:     Mental Status: He is alert and oriented to person, place, and time.  Psychiatric:        Mood and Affect: Mood normal.        Behavior: Behavior normal.        Thought Content: Thought content normal.        Judgment: Judgment normal.     BP 124/83   Pulse 67   Resp 16   Ht 5\' 11"  (1.803 m)   Wt 169 lb 9.6 oz (76.9 kg)   BMI 23.65 kg/m   Past Medical History:  Diagnosis Date   Adjustment disorder with mixed anxiety and depressed mood    Anemia due to chronic kidney disease    Asthma    Cannabis use disorder    Diabetes mellitus type 2, insulin dependent (HCC)    DKA (diabetic ketoacidosis) (HCC)    End stage renal disease on dialysis (HCC)    Hyperlipidemia    Hypertension associated with type 2 diabetes mellitus (HCC)    Obstructive sleep apnea     Social History   Socioeconomic History   Marital status: Single    Spouse name: Not on file   Number of children: 0   Years of  education: Not on file   Highest education level: Not on file  Occupational History   Not on file  Tobacco Use   Smoking status: Former    Current packs/day: 0.00    Types: Cigarettes    Quit date: 05/2023    Years since quitting: 0.2   Smokeless tobacco: Never  Vaping Use   Vaping status: Never Used  Substance and Sexual Activity   Alcohol use: Not Currently   Drug use: Yes    Types: Marijuana   Sexual activity: Not on file  Other Topics Concern   Not on file  Social History Narrative   Lives with mother   Social Drivers of Health   Financial Resource Strain: Low Risk  (12/19/2022)   Received from [facility]   Overall Financial Resource Strain (CARDIA)    Difficulty of Paying Living Expenses: Not hard at all  Recent Concern: Financial Resource Strain - High Risk (10/22/2022)   Received from [facility], [facility]   Overall Financial Resource Strain (CARDIA)    Difficulty of  Paying Living Expenses: Hard  Food Insecurity: No Food Insecurity (12/19/2022)   Received from [facility]   Hunger Vital Sign    Worried About Running Out of Food in the Last Year: Never true    Ran Out of Food in the Last Year: Never true  Recent Concern: Food Insecurity - Food Insecurity Present (10/22/2022)   Received from [facility], [facility]   Hunger Vital Sign    Worried About Running Out of Food in the Last Year: Sometimes true    Ran Out of Food in the Last Year: Never true  Transportation Needs: No Transportation Needs (12/19/2022)   Received from [facility] - Transportation    Lack of Transportation (Medical): No    Lack of Transportation (Non-Medical): No  Physical Activity: Not on file  Stress: No Stress Concern Present (03/04/2023)   Received from [facility] - Occupational Stress Questionnaire    Feeling of Stress : Not at all  Social Connections: Unknown (02/03/2023)   Received from [facility]   Social Network    Social Network: Not on file  Intimate Partner Violence: Unknown (02/03/2023)   Received from [facility]   HITS    Physically Hurt: Not on file    Insult or Talk Down To: Not on file    Threaten Physical Harm: Not on file    Scream or Curse: Not on file    Past Surgical History:  Procedure Laterality Date   AV FISTULA PLACEMENT Left 07/17/2023   Procedure: ARTERIOVENOUS (AV) FISTULA CREATION (RADIALCEPHALIC);  Surgeon: Annice Needy, MD;  Location: [facility] ORS;  Service: Vascular;  Laterality: Left;   CAPD REMOVAL N/A 07/17/2023   Procedure: CONTINUOUS AMBULATORY PERITONEAL DIALYSIS  (CAPD) CATHETER REMOVAL;  Surgeon: Annice Needy, MD;  Location: [facility] ORS;  Service: Vascular;  Laterality: N/A;   DIALYSIS/PERMA CATHETER INSERTION  10/2022   PARS PLANA VITRECTOMY W/ ENDOLASER PANRETINAL PHOTOCOAGULATION Right 06/29/2018   PERITONEAL CATHETER INSERTION  03/04/2023   TONSILLECTOMY AND ADENOIDECTOMY  2002    Family History  Problem Relation Age of Onset   Healthy Mother     No Known Allergies     Latest Ref Rng & Units 07/17/2023    6:44 AM 07/10/2023   11:45 AM 05/17/2022    3:53 AM  CBC  WBC 4.0 - 10.5 K/uL  6.9  10.3   Hemoglobin 13.0 - 17.0 g/dL 9.5  9.4  8.4   Hematocrit 39.0 - 52.0 % 28.0  29.1  24.9   Platelets 150 - 400 K/uL  175  218       CMP     Component Value Date/Time   NA 137 07/17/2023 0644   K 5.4 (H) 07/17/2023 0644   CL 107 07/17/2023 0644   CO2 21 (L) 07/10/2023 1145   GLUCOSE 153 (H) 07/17/2023 0644   BUN 95 (H) 07/17/2023 0644   CREATININE 16.90 (H) 07/17/2023 0644   CALCIUM 8.1 (L) 07/10/2023 1145   PROT 7.0 05/15/2022 1246   ALBUMIN 3.7 05/15/2022 1246   AST 27 05/15/2022 1246   ALT 48 (H) 05/15/2022 1246   ALKPHOS 141 (H) 05/15/2022 1246   BILITOT 1.1 05/15/2022 1246   GFRNONAA 5 (L) 07/10/2023 1145     No results found.     Assessment & Plan:   1. ESRD (end stage renal disease) (HCC)  (Primary) Recommend:  The patient is experiencing  increasing problems with their dialysis access.  Patient should have a fistulagram with the intention for intervention.  The intention for intervention is to restore appropriate flow and prevent thrombosis and possible loss of the access.  As well as improve the quality of dialysis therapy.  The risks, benefits and alternative therapies were reviewed in detail with the patient.  All questions were answered.  The patient agrees to proceed with angio/intervention.    The patient will follow up with me in the office after the procedure.  It was also discussed that if we are not able to treat his fistula we may need to discuss possible new access.   2. Type 2 diabetes mellitus without complication, with long-term current use of insulin (HCC) Continue hypoglycemic medications as already ordered, these medications have been reviewed and there are no changes at this time.  Hgb A1C to be monitored as already arranged by primary service   Current Outpatient Medications on File Prior to Visit  Medication Sig Dispense Refill   albuterol (VENTOLIN HFA) 108 (90 Base) MCG/ACT inhaler Inhale 2 puffs into the lungs every 6 (six) hours as needed for wheezing or shortness of breath.     amLODipine (NORVASC) 10 MG tablet Take 10 mg by mouth daily.     atorvastatin (LIPITOR) 20 MG tablet Take 1 tablet by mouth daily.     Calcium Acetate 667 MG TABS Take 1,334 mg by mouth 3 (three) times daily with meals.     carvedilol (COREG) 25 MG tablet Take 25 mg by mouth 2 (two) times daily.     cloNIDine (CATAPRES) 0.1 MG tablet Take 0.1 mg by mouth 2 (two) times daily.     Continuous Glucose Receiver (DEXCOM G7 RECEIVER) DEVI by Does not apply route.     Continuous Glucose Sensor (DEXCOM G7 SENSOR) MISC by Does not apply route.     doxazosin (CARDURA) 4 MG tablet Take 4 mg by mouth 2 (two) times daily.     EPINEPHrine 0.3 MG/0.3ML SOSY Inject 0.3 mg into the muscle  once.     furosemide (LASIX) 40 MG tablet Take 40 mg by mouth 2 (two) times daily.     gabapentin (NEURONTIN) 100 MG capsule Take 100 mg by mouth 3 (three) times a week. With dialysis M-W-F     hydrALAZINE (APRESOLINE) 100 MG tablet Take 100 mg by mouth 3 (three) times daily.     insulin glargine (LANTUS) 100 UNIT/ML injection Inject 12 Units into the skin daily with supper.     insulin lispro (HUMALOG) 100 UNIT/ML KwikPen Inject 2-4 Units into the skin 3 (three) times daily with meals.     Insulin Pen Needle 29G X MISC Please dispense 1 box of 100 insulin needles and syringes 100 each 1   irbesartan (AVAPRO) 300 MG tablet Take 300 mg by mouth at bedtime.     mirtazapine (REMERON) 15 MG tablet Take 15 mg by mouth at bedtime.     promethazine (PHENERGAN) 12.5 MG tablet Take 12.5 mg by mouth every 6 (six) hours as needed for nausea or vomiting.     sertraline (ZOLOFT) 50 MG tablet Take 50 mg by mouth daily.     sodium bicarbonate 650 MG tablet Take 650 mg by mouth 2 (two) times daily.     traMADol (ULTRAM) 50 MG tablet Take 1 tablet (50 mg total) by mouth every 6 (six) hours as needed. 20 tablet 0   traZODone (DESYREL) 50 MG tablet Take 50 mg  by mouth at bedtime.     No current facility-administered medications on file prior to visit.    There are no Patient Instructions on file for this visit. No follow-ups on file.   Georgiana Spinner, NP

## 2023-08-29 ENCOUNTER — Telehealth (INDEPENDENT_AMBULATORY_CARE_PROVIDER_SITE_OTHER): Payer: Self-pay

## 2023-08-29 NOTE — Telephone Encounter (Signed)
I attempted to contact the patient to schedule him for a left arm fistulagram with Dr. Dew. A message was left for a return call. 

## 2023-08-29 NOTE — Telephone Encounter (Signed)
 Patient called back and is scheduled with Dr. Wyn Quaker on 09/01/23 with a 2:30 pm arrival time to the [facility] for a left arm fistulagram. Pre-procedure instructions were discussed and will be sent to Mychart.

## 2023-09-01 ENCOUNTER — Ambulatory Visit
Admission: RE | Admit: 2023-09-01 | Discharge: 2023-09-01 | Disposition: A | Discharge status: Home / Self Care | Attending: Vascular Surgery | Admitting: Vascular Surgery

## 2023-09-01 ENCOUNTER — Encounter: Payer: Self-pay | Admitting: Vascular Surgery

## 2023-09-01 ENCOUNTER — Encounter
Admission: RE | Disposition: A | Discharge status: Home / Self Care | Payer: Self-pay | Source: Home / Self Care | Attending: Vascular Surgery

## 2023-09-01 DIAGNOSIS — Z539 Procedure and treatment not carried out, unspecified reason: Secondary | ICD-10-CM | POA: Diagnosis not present

## 2023-09-01 DIAGNOSIS — N186 End stage renal disease: Secondary | ICD-10-CM | POA: Insufficient documentation

## 2023-09-01 LAB — GLUCOSE, CAPILLARY: Glucose-Capillary: 137 mg/dL — ABNORMAL HIGH (ref 70–99)

## 2023-09-01 LAB — POTASSIUM (ARMC VASCULAR LAB ONLY): Potassium (ARMC vascular lab): 6.2 mmol/L — ABNORMAL HIGH (ref 3.5–5.1)

## 2023-09-01 LAB — POTASSIUM: Potassium: 6.3 mmol/L (ref 3.5–5.1)

## 2023-09-01 SURGERY — A/V FISTULAGRAM
Anesthesia: Moderate Sedation | Laterality: Left

## 2023-09-01 MED ORDER — SODIUM CHLORIDE 0.9 % IV SOLN
INTRAVENOUS | Status: DC
Start: 2023-09-01 — End: 2023-09-01

## 2023-09-01 MED ORDER — HYDROMORPHONE HCL 1 MG/ML IJ SOLN: 1.0000 mg | Freq: Once | INTRAMUSCULAR | Status: DC | PRN

## 2023-09-01 MED ORDER — MIDAZOLAM HCL 2 MG/ML PO SYRP: 8.0000 mg | ORAL_SOLUTION | Freq: Once | ORAL | Status: DC | PRN

## 2023-09-01 MED ORDER — DIPHENHYDRAMINE HCL 50 MG/ML IJ SOLN
50.0000 mg | Freq: Once | INTRAMUSCULAR | Status: DC | PRN
Start: 2023-09-01 — End: 2023-09-01

## 2023-09-01 MED ORDER — ONDANSETRON HCL 4 MG/2ML IJ SOLN: 4.0000 mg | Freq: Four times a day (QID) | INTRAMUSCULAR | Status: DC | PRN

## 2023-09-01 MED ORDER — CEFAZOLIN SODIUM-DEXTROSE 1-4 GM/50ML-% IV SOLN: 1.0000 g | INTRAVENOUS | Status: DC

## 2023-09-01 MED ORDER — FAMOTIDINE 20 MG PO TABS: 40.0000 mg | ORAL_TABLET | Freq: Once | ORAL | Status: DC | PRN

## 2023-09-01 MED ORDER — METHYLPREDNISOLONE SODIUM SUCC 125 MG IJ SOLR: 125.0000 mg | Freq: Once | INTRAMUSCULAR | Status: DC | PRN

## 2023-09-01 NOTE — OR Nursing (Addendum)
 Vascular lab notified that K+ 6.2.

## 2023-09-01 NOTE — Progress Notes (Signed)
 Repeat K+ 6.3 via lab draw. Called Dr. Wyn Quaker: case cancelled for today. Pt. To be dialyzed , then rescheduled for fistulogram.

## 2023-09-05 ENCOUNTER — Telehealth (INDEPENDENT_AMBULATORY_CARE_PROVIDER_SITE_OTHER): Payer: Self-pay

## 2023-09-05 NOTE — Telephone Encounter (Signed)
 Spoke with the patient and he has been rescheduled with Dr. Wyn Quaker for a left arm fistulagram with a 9:30 am arrival time to the [facility]. Pre-procedure instructions were discussed and will be sent to Mychart and mailed.

## 2023-09-08 ENCOUNTER — Other Ambulatory Visit
Admission: RE | Admit: 2023-09-08 | Discharge: 2023-09-08 | Disposition: A | Discharge status: Home / Self Care | Source: Ambulatory Visit | Attending: Internal Medicine | Admitting: Internal Medicine

## 2023-09-08 DIAGNOSIS — E875 Hyperkalemia: Secondary | ICD-10-CM | POA: Diagnosis present

## 2023-09-08 LAB — POTASSIUM: Potassium: 5.9 mmol/L — ABNORMAL HIGH (ref 3.5–5.1)

## 2023-09-11 ENCOUNTER — Encounter
Admission: RE | Disposition: A | Discharge status: Home / Self Care | Payer: Self-pay | Source: Home / Self Care | Attending: Vascular Surgery

## 2023-09-11 ENCOUNTER — Encounter: Payer: Self-pay | Admitting: Vascular Surgery

## 2023-09-11 ENCOUNTER — Ambulatory Visit
Admission: RE | Admit: 2023-09-11 | Discharge: 2023-09-11 | Disposition: A | Discharge status: Home / Self Care | Attending: Vascular Surgery | Admitting: Vascular Surgery

## 2023-09-11 DIAGNOSIS — Y832 Surgical operation with anastomosis, bypass or graft as the cause of abnormal reaction of the patient, or of later complication, without mention of misadventure at the time of the procedure: Secondary | ICD-10-CM | POA: Insufficient documentation

## 2023-09-11 DIAGNOSIS — E1122 Type 2 diabetes mellitus with diabetic chronic kidney disease: Secondary | ICD-10-CM | POA: Diagnosis not present

## 2023-09-11 DIAGNOSIS — I120 Hypertensive chronic kidney disease with stage 5 chronic kidney disease or end stage renal disease: Secondary | ICD-10-CM | POA: Diagnosis not present

## 2023-09-11 DIAGNOSIS — Z79899 Other long term (current) drug therapy: Secondary | ICD-10-CM | POA: Diagnosis not present

## 2023-09-11 DIAGNOSIS — T82858A Stenosis of vascular prosthetic devices, implants and grafts, initial encounter: Secondary | ICD-10-CM | POA: Diagnosis not present

## 2023-09-11 DIAGNOSIS — Z87891 Personal history of nicotine dependence: Secondary | ICD-10-CM | POA: Insufficient documentation

## 2023-09-11 DIAGNOSIS — Z794 Long term (current) use of insulin: Secondary | ICD-10-CM | POA: Diagnosis not present

## 2023-09-11 DIAGNOSIS — N186 End stage renal disease: Secondary | ICD-10-CM | POA: Diagnosis not present

## 2023-09-11 DIAGNOSIS — Z992 Dependence on renal dialysis: Secondary | ICD-10-CM | POA: Insufficient documentation

## 2023-09-11 HISTORY — PX: A/V FISTULAGRAM: CATH118298

## 2023-09-11 LAB — GLUCOSE, CAPILLARY: Glucose-Capillary: 320 mg/dL — ABNORMAL HIGH (ref 70–99)

## 2023-09-11 LAB — POTASSIUM (ARMC VASCULAR LAB ONLY): Potassium (ARMC vascular lab): 5 mmol/L (ref 3.5–5.1)

## 2023-09-11 SURGERY — A/V FISTULAGRAM
Anesthesia: Moderate Sedation | Laterality: Left

## 2023-09-11 MED ORDER — ONDANSETRON HCL 4 MG/2ML IJ SOLN: 4.0000 mg | Freq: Four times a day (QID) | INTRAMUSCULAR | Status: DC | PRN

## 2023-09-11 MED ORDER — MIDAZOLAM HCL 2 MG/ML PO SYRP: 8.0000 mg | ORAL_SOLUTION | Freq: Once | ORAL | Status: DC | PRN

## 2023-09-11 MED ORDER — METHYLPREDNISOLONE SODIUM SUCC 125 MG IJ SOLR: 125.0000 mg | Freq: Once | INTRAMUSCULAR | Status: DC | PRN

## 2023-09-11 MED ORDER — FAMOTIDINE 20 MG PO TABS: 40.0000 mg | ORAL_TABLET | Freq: Once | ORAL | Status: DC | PRN

## 2023-09-11 MED ORDER — SODIUM CHLORIDE 0.9 % IV SOLN: INTRAVENOUS | Status: DC

## 2023-09-11 MED ORDER — MIDAZOLAM HCL 5 MG/5ML IJ SOLN
INTRAMUSCULAR | Status: AC
  Filled 2023-09-11: qty 5

## 2023-09-11 MED ORDER — HEPARIN (PORCINE) IN NACL 1000-0.9 UT/500ML-% IV SOLN
INTRAVENOUS | Status: DC | PRN
  Administered 2023-09-11: 500 mL

## 2023-09-11 MED ORDER — FENTANYL CITRATE (PF) 100 MCG/2ML IJ SOLN
INTRAMUSCULAR | Status: AC
  Filled 2023-09-11: qty 2

## 2023-09-11 MED ORDER — CEFAZOLIN SODIUM-DEXTROSE 1-4 GM/50ML-% IV SOLN
1.0000 g | INTRAVENOUS | Status: AC
  Administered 2023-09-11: 1 g via INTRAVENOUS

## 2023-09-11 MED ORDER — LIDOCAINE-EPINEPHRINE (PF) 1 %-1:200000 IJ SOLN
INTRAMUSCULAR | Status: DC | PRN
  Administered 2023-09-11: 10 mL

## 2023-09-11 MED ORDER — DIPHENHYDRAMINE HCL 50 MG/ML IJ SOLN: 50.0000 mg | Freq: Once | INTRAMUSCULAR | Status: DC | PRN

## 2023-09-11 MED ORDER — MIDAZOLAM HCL 5 MG/5ML IJ SOLN
INTRAMUSCULAR | Status: AC
Start: 2023-09-11 — End: ?
  Filled 2023-09-11: qty 5

## 2023-09-11 MED ORDER — HEPARIN SODIUM (PORCINE) 1000 UNIT/ML IJ SOLN
INTRAMUSCULAR | Status: DC | PRN
  Administered 2023-09-11: 3000 [IU] via INTRAVENOUS

## 2023-09-11 MED ORDER — HEPARIN SODIUM (PORCINE) 1000 UNIT/ML IJ SOLN
INTRAMUSCULAR | Status: AC
  Filled 2023-09-11: qty 10

## 2023-09-11 MED ORDER — IODIXANOL 320 MG/ML IV SOLN
INTRAVENOUS | Status: DC | PRN
  Administered 2023-09-11: 20 mL

## 2023-09-11 MED ORDER — MIDAZOLAM HCL 2 MG/2ML IJ SOLN
INTRAMUSCULAR | Status: DC | PRN
  Administered 2023-09-11: 2 mg via INTRAVENOUS
  Administered 2023-09-11: 1 mg via INTRAVENOUS
  Administered 2023-09-11: 2 mg via INTRAVENOUS

## 2023-09-11 MED ORDER — HYDROMORPHONE HCL 1 MG/ML IJ SOLN: 1.0000 mg | Freq: Once | INTRAMUSCULAR | Status: DC | PRN

## 2023-09-11 MED ORDER — CEFAZOLIN SODIUM-DEXTROSE 1-4 GM/50ML-% IV SOLN
INTRAVENOUS | Status: AC
  Filled 2023-09-11: qty 50

## 2023-09-11 MED ORDER — FENTANYL CITRATE (PF) 100 MCG/2ML IJ SOLN
INTRAMUSCULAR | Status: DC | PRN
  Administered 2023-09-11: 50 ug via INTRAVENOUS
  Administered 2023-09-11 (×2): 25 ug via INTRAVENOUS

## 2023-09-11 SURGICAL SUPPLY — 15 items
BALLN LUTONIX 018 6X150X130 (BALLOONS) ×1 IMPLANT
BALLN LUTONIX 5X220X130 (BALLOONS) ×1 IMPLANT
BALLOON LUTONIX 018 6X150X130 (BALLOONS) IMPLANT
BALLOON LUTONIX 5X220X130 (BALLOONS) IMPLANT
CATH BEACON 5 .035 40 KMP TP (CATHETERS) IMPLANT
COVER PROBE ULTRASOUND 5X96 (MISCELLANEOUS) IMPLANT
DEVICE PRESTO INFLATION (MISCELLANEOUS) IMPLANT
DRAPE BRACHIAL (DRAPES) IMPLANT
GLIDEWIRE ADV .035X180CM (WIRE) IMPLANT
KIT MICROPUNCTURE VSI 5F STIFF (SHEATH) IMPLANT
PACK ANGIOGRAPHY (CUSTOM PROCEDURE TRAY) ×1 IMPLANT
SHEATH BRITE TIP 6FRX5.5 (SHEATH) IMPLANT
STENT VIABAHN 6X150X120 (Permanent Stent) IMPLANT
SUT MNCRL AB 4-0 PS2 18 (SUTURE) IMPLANT
WIRE G 018X200 V18 (WIRE) IMPLANT

## 2023-09-11 NOTE — Interval H&P Note (Signed)
 History and Physical Interval Note:  09/11/2023 9:19 AM  Darrell Walls  has presented today for surgery, with the diagnosis of L arm fistulagram   End Stage Renal.  The various methods of treatment have been discussed with the patient and family. After consideration of risks, benefits and other options for treatment, the patient has consented to  Procedure(s): A/V Fistulagram (Left) as a surgical intervention.  The patient's history has been reviewed, patient examined, no change in status, stable for surgery.  I have reviewed the patient's chart and labs.  Questions were answered to the patient's satisfaction.     Ananth Fiallos

## 2023-09-11 NOTE — Op Note (Signed)
 [facility]    OPERATIVE NOTE   PROCEDURE: 1.   Left radiocephalic arteriovenous fistula cannulation under ultrasound guidance 2.   Left arm fistulagram  3.   Percutaneous transluminal angioplasty of left forearm cephalic vein with 5 mm diameter by 22 cm length Lutonix drug-coated angioplasty balloon 4.   Stent placement to the left forearm cephalic vein with 6 mm diameter by 15 cm length Viabahn stent  PRE-OPERATIVE DIAGNOSIS: 1. ESRD 2. Poorly functional left radiocephalic AVF  POST-OPERATIVE DIAGNOSIS: same as above with occlusion of the proximal and mid forearm cephalic vein  SURGEON: Mikki Alexander, MD  ANESTHESIA: local with MCS  ESTIMATED BLOOD LOSS: 5 cc  FINDING(S): The radiocephalic anastomosis itself was patent, but about 5 cm beyond the anastomosis, there were collaterals and then the cephalic vein occluded and remained occluded until the proximal forearm where reconstituted through collaterals and joining the median antecubital vein.  There was dual outflow in the upper arm.  SPECIMEN(S):  None  CONTRAST: 20 cc  FLUORO TIME: 2.1 minutes  MODERATE CONSCIOUS SEDATION TIME: Approximately 40 minutes with 5 mg of Versed and 100 mcg of Fentanyl   INDICATIONS: Darrell Walls is a 33 y.o. male who presents with malfunctioning nonmaturing left radiocephalic arteriovenous fistula.  The patient is scheduled for left arm fistulagram.  The patient is aware the risks include but are not limited to: bleeding, infection, thrombosis of the cannulated access, and possible anaphylactic reaction to the contrast.  The patient is aware of the risks of the procedure and elects to proceed forward.  DESCRIPTION: After full informed written consent was obtained, the patient was brought back to the angiography suite and placed supine upon the angiography table.  The patient was connected to monitoring equipment. Moderate conscious sedation was administered with a face to face  encounter with the patient throughout the procedure with my supervision of the RN administering medicines and monitoring the patient's vital signs and mental status throughout from the start of the procedure until the patient was taken to the recovery room. The left arm was prepped and draped in the standard fashion for a percutaneous access intervention.  Under ultrasound guidance, the perianastomotic cephalic vein portion of the left radiocephalic arteriovenous fistula was cannulated with a micropuncture needle under direct ultrasound guidance where it was patent and a permanent image was performed.  The microwire was advanced into the fistula and the needle was exchanged for the a microsheath.  I then upsized to a 6 Fr Sheath and imaging was performed.  Hand injections were completed to image the access including the central venous system. This demonstrated that the radiocephalic anastomosis itself was patent, but about 5 cm beyond the anastomosis, there were collaterals and then the cephalic vein occluded and remained occluded until the proximal forearm where reconstituted through collaterals and joining the median antecubital vein.  There was dual outflow in the upper arm.  Based on the images, this patient will need extensive revascularization to salvage the fistula. I then gave the patient 3000 units of intravenous heparin.  I then crossed the stenosis with an advantage wire.  Based on the imaging, a 5 mm x 22 cm Lutonix drug-coated angioplasty balloon was selected.  The balloon was centered around the forearm cephalic vein occlusion/stenosis and inflated to 12 ATM for 1 minute(s).  On completion imaging, a greater than 60% residual stenosis was present.  In 2 areas in the proximal to mid forearm cephalic vein.  Then exchanged for a  V18 wire and stented these areas with a 6 mm diameter by 15 cm length Viabahn stent and postdilated this with a 6 mm diameter Lutonix drug-coated balloon with excellent  angiographic completion result and less than 10% residual stenosis.  This also excluded one of the large sidebranches that would be competing with the cephalic vein.   Based on the completion imaging, no further intervention is necessary.  The wire and balloon were removed from the sheath.  A 4-0 Monocryl purse-string suture was sewn around the sheath.  The sheath was removed while tying down the suture.  A sterile bandage was applied to the puncture site.  COMPLICATIONS: None  CONDITION: Stable   Mikki Alexander  09/11/2023 11:30 AM   This note was created with Dragon Medical transcription system. Any errors in dictation are purely unintentional.

## 2023-09-25 ENCOUNTER — Other Ambulatory Visit (INDEPENDENT_AMBULATORY_CARE_PROVIDER_SITE_OTHER): Payer: Self-pay | Admitting: Vascular Surgery

## 2023-09-25 DIAGNOSIS — N186 End stage renal disease: Secondary | ICD-10-CM

## 2023-10-05 ENCOUNTER — Emergency Department

## 2023-10-05 ENCOUNTER — Other Ambulatory Visit: Payer: Self-pay

## 2023-10-05 ENCOUNTER — Inpatient Hospital Stay
Admission: EM | Admit: 2023-10-05 | Discharge: 2023-10-08 | DRG: 252 | Disposition: A | Discharge status: Home / Self Care | Attending: Obstetrics and Gynecology | Admitting: Obstetrics and Gynecology

## 2023-10-05 DIAGNOSIS — G4733 Obstructive sleep apnea (adult) (pediatric): Secondary | ICD-10-CM | POA: Diagnosis present

## 2023-10-05 DIAGNOSIS — E8729 Other acidosis: Principal | ICD-10-CM | POA: Diagnosis present

## 2023-10-05 DIAGNOSIS — E785 Hyperlipidemia, unspecified: Secondary | ICD-10-CM | POA: Diagnosis present

## 2023-10-05 DIAGNOSIS — Z992 Dependence on renal dialysis: Secondary | ICD-10-CM

## 2023-10-05 DIAGNOSIS — E1110 Type 2 diabetes mellitus with ketoacidosis without coma: Secondary | ICD-10-CM | POA: Diagnosis present

## 2023-10-05 DIAGNOSIS — I120 Hypertensive chronic kidney disease with stage 5 chronic kidney disease or end stage renal disease: Secondary | ICD-10-CM | POA: Diagnosis present

## 2023-10-05 DIAGNOSIS — T82868D Thrombosis of vascular prosthetic devices, implants and grafts, subsequent encounter: Secondary | ICD-10-CM

## 2023-10-05 DIAGNOSIS — F329 Major depressive disorder, single episode, unspecified: Secondary | ICD-10-CM | POA: Diagnosis present

## 2023-10-05 DIAGNOSIS — Z1152 Encounter for screening for COVID-19: Secondary | ICD-10-CM

## 2023-10-05 DIAGNOSIS — I160 Hypertensive urgency: Secondary | ICD-10-CM | POA: Diagnosis present

## 2023-10-05 DIAGNOSIS — Y832 Surgical operation with anastomosis, bypass or graft as the cause of abnormal reaction of the patient, or of later complication, without mention of misadventure at the time of the procedure: Secondary | ICD-10-CM | POA: Diagnosis present

## 2023-10-05 DIAGNOSIS — N186 End stage renal disease: Secondary | ICD-10-CM | POA: Diagnosis present

## 2023-10-05 DIAGNOSIS — Z79899 Other long term (current) drug therapy: Secondary | ICD-10-CM | POA: Diagnosis not present

## 2023-10-05 DIAGNOSIS — R112 Nausea with vomiting, unspecified: Secondary | ICD-10-CM

## 2023-10-05 DIAGNOSIS — E1122 Type 2 diabetes mellitus with diabetic chronic kidney disease: Secondary | ICD-10-CM | POA: Diagnosis present

## 2023-10-05 DIAGNOSIS — R109 Unspecified abdominal pain: Secondary | ICD-10-CM | POA: Diagnosis present

## 2023-10-05 DIAGNOSIS — Z794 Long term (current) use of insulin: Secondary | ICD-10-CM

## 2023-10-05 DIAGNOSIS — Z87891 Personal history of nicotine dependence: Secondary | ICD-10-CM | POA: Diagnosis not present

## 2023-10-05 DIAGNOSIS — T82858A Stenosis of vascular prosthetic devices, implants and grafts, initial encounter: Secondary | ICD-10-CM | POA: Diagnosis not present

## 2023-10-05 DIAGNOSIS — D631 Anemia in chronic kidney disease: Secondary | ICD-10-CM | POA: Diagnosis present

## 2023-10-05 DIAGNOSIS — R6510 Systemic inflammatory response syndrome (SIRS) of non-infectious origin without acute organ dysfunction: Secondary | ICD-10-CM

## 2023-10-05 DIAGNOSIS — M79602 Pain in left arm: Secondary | ICD-10-CM

## 2023-10-05 DIAGNOSIS — T82868A Thrombosis of vascular prosthetic devices, implants and grafts, initial encounter: Principal | ICD-10-CM | POA: Diagnosis present

## 2023-10-05 DIAGNOSIS — E119 Type 2 diabetes mellitus without complications: Secondary | ICD-10-CM

## 2023-10-05 LAB — COMPREHENSIVE METABOLIC PANEL WITH GFR
ALT: 21 U/L (ref 0–44)
AST: 24 U/L (ref 15–41)
Albumin: 4.8 g/dL (ref 3.5–5.0)
Alkaline Phosphatase: 132 U/L — ABNORMAL HIGH (ref 38–126)
Anion gap: 27 — ABNORMAL HIGH (ref 5–15)
BUN: 48 mg/dL — ABNORMAL HIGH (ref 6–20)
CO2: 13 mmol/L — ABNORMAL LOW (ref 22–32)
Calcium: 9.8 mg/dL (ref 8.9–10.3)
Chloride: 99 mmol/L (ref 98–111)
Creatinine, Ser: 9.99 mg/dL — ABNORMAL HIGH (ref 0.61–1.24)
GFR, Estimated: 7 mL/min — ABNORMAL LOW (ref >60)
Glucose, Bld: 134 mg/dL — ABNORMAL HIGH (ref 70–99)
Potassium: 4.5 mmol/L (ref 3.5–5.1)
Sodium: 139 mmol/L (ref 135–145)
Total Bilirubin: 1.7 mg/dL — ABNORMAL HIGH (ref 0.0–1.2)
Total Protein: 9.4 g/dL — ABNORMAL HIGH (ref 6.5–8.1)

## 2023-10-05 LAB — BLOOD GAS, VENOUS
Acid-base deficit: 4.4 mmol/L — ABNORMAL HIGH (ref 0.0–2.0)
Bicarbonate: 18.8 mmol/L — ABNORMAL LOW (ref 20.0–28.0)
O2 Saturation: 96.2 %
Patient temperature: 37
pCO2, Ven: 29 mmHg — ABNORMAL LOW (ref 44–60)
pH, Ven: 7.42 (ref 7.25–7.43)
pO2, Ven: 73 mmHg — ABNORMAL HIGH (ref 32–45)

## 2023-10-05 LAB — CBC
HCT: 38.1 % — ABNORMAL LOW (ref 39.0–52.0)
Hemoglobin: 12.7 g/dL — ABNORMAL LOW (ref 13.0–17.0)
MCH: 29.7 pg (ref 26.0–34.0)
MCHC: 33.3 g/dL (ref 30.0–36.0)
MCV: 89 fL (ref 80.0–100.0)
Platelets: 360 10*3/uL (ref 150–400)
RBC: 4.28 MIL/uL (ref 4.22–5.81)
RDW: 14.8 % (ref 11.5–15.5)
WBC: 8.4 10*3/uL (ref 4.0–10.5)
nRBC: 0 % (ref 0.0–0.2)

## 2023-10-05 LAB — LIPASE, BLOOD: Lipase: 24 U/L (ref 11–51)

## 2023-10-05 LAB — PROTIME-INR
INR: 1.2 (ref 0.8–1.2)
Prothrombin Time: 15.8 s — ABNORMAL HIGH (ref 11.4–15.2)

## 2023-10-05 LAB — RESP PANEL BY RT-PCR (RSV, FLU A&B, COVID)  RVPGX2
Influenza A by PCR: NEGATIVE
Influenza B by PCR: NEGATIVE
Resp Syncytial Virus by PCR: NEGATIVE
SARS Coronavirus 2 by RT PCR: NEGATIVE

## 2023-10-05 LAB — LACTIC ACID, PLASMA
Lactic Acid, Venous: 2.4 mmol/L (ref 0.5–1.9)
Lactic Acid, Venous: 1.7 mmol/L (ref 0.5–1.9)

## 2023-10-05 LAB — BETA-HYDROXYBUTYRIC ACID
Beta-Hydroxybutyric Acid: 3.87 mmol/L — ABNORMAL HIGH (ref 0.05–0.27)
Beta-Hydroxybutyric Acid: 5.26 mmol/L — ABNORMAL HIGH (ref 0.05–0.27)

## 2023-10-05 LAB — CBG MONITORING, ED: Glucose-Capillary: 150 mg/dL — ABNORMAL HIGH (ref 70–99)

## 2023-10-05 MED ORDER — VANCOMYCIN HCL IN DEXTROSE 1-5 GM/200ML-% IV SOLN
1000.0000 mg | Freq: Once | INTRAVENOUS | Status: AC
  Administered 2023-10-05: 1000 mg via INTRAVENOUS
  Filled 2023-10-05: qty 200

## 2023-10-05 MED ORDER — LACTATED RINGERS IV BOLUS (SEPSIS)
1000.0000 mL | Freq: Once | INTRAVENOUS | Status: AC
  Administered 2023-10-05: 1000 mL via INTRAVENOUS

## 2023-10-05 MED ORDER — METRONIDAZOLE 500 MG/100ML IV SOLN
500.0000 mg | Freq: Once | INTRAVENOUS | Status: AC
  Administered 2023-10-05: 500 mg via INTRAVENOUS
  Filled 2023-10-05: qty 100

## 2023-10-05 MED ORDER — MORPHINE SULFATE (PF) 4 MG/ML IV SOLN
4.0000 mg | Freq: Once | INTRAVENOUS | Status: AC
  Administered 2023-10-05: 4 mg via INTRAVENOUS
  Filled 2023-10-05: qty 1

## 2023-10-05 MED ORDER — ONDANSETRON HCL 4 MG/2ML IJ SOLN
4.0000 mg | Freq: Once | INTRAMUSCULAR | Status: AC
  Administered 2023-10-05: 4 mg via INTRAVENOUS
  Filled 2023-10-05: qty 2

## 2023-10-05 MED ORDER — LABETALOL HCL 5 MG/ML IV SOLN
10.0000 mg | Freq: Once | INTRAVENOUS | Status: AC
  Administered 2023-10-05: 10 mg via INTRAVENOUS
  Filled 2023-10-05: qty 4

## 2023-10-05 MED ORDER — METOCLOPRAMIDE HCL 5 MG/ML IJ SOLN
5.0000 mg | Freq: Once | INTRAMUSCULAR | Status: AC
  Administered 2023-10-05: 5 mg via INTRAVENOUS
  Filled 2023-10-05: qty 2

## 2023-10-05 MED ORDER — CLONIDINE HCL 0.1 MG PO TABS: 0.1000 mg | ORAL_TABLET | Freq: Once | ORAL | Status: DC

## 2023-10-05 MED ORDER — DEXTROSE 10 % IV SOLN: INTRAVENOUS | Status: DC

## 2023-10-05 MED ORDER — SODIUM CHLORIDE 0.9 % IV SOLN
2.0000 g | Freq: Once | INTRAVENOUS | Status: AC
  Administered 2023-10-05: 2 g via INTRAVENOUS
  Filled 2023-10-05: qty 12.5

## 2023-10-05 MED ORDER — LACTATED RINGERS IV BOLUS
1000.0000 mL | Freq: Once | INTRAVENOUS | Status: AC
  Administered 2023-10-05: 1000 mL via INTRAVENOUS

## 2023-10-05 MED ORDER — AMLODIPINE BESYLATE 5 MG PO TABS: 10.0000 mg | ORAL_TABLET | Freq: Once | ORAL | Status: DC

## 2023-10-05 NOTE — ED Provider Notes (Signed)
 [facility] Provider Note    Event Date/Time   First MD Initiated Contact with Patient 10/05/23 1736     (approximate)   History   Emesis   HPI Darrell Walls is a 33 y.o. male with history of ESRD on dialysis, diabetes presenting today for vomiting.  Patient states over the past 24 hours he has had generalized abdominal pain associated with vomiting.  He has not been able to eat or drink anything.  Feels feverish as well.  Otherwise denies cough, congestion, shortness of breath, chest pain.  No diarrhea or constipation.  Reportedly had black appearing vomit.  Denies any black appearing stools though.  Also having pain in his left arm which she has had since his initial surgery for fistulas.  Patient had ED visit 11 days ago at [facility].  Had ultrasound for pain in his left upper extremity which showed possible abscess.  He was started on antibiotics at that time with Keflex.     Physical Exam   Triage Vital Signs: ED Triage Vitals  Encounter Vitals Group     BP 10/05/23 1727 (!) 152/101     Systolic BP Percentile --      Diastolic BP Percentile --      Pulse Rate 10/05/23 1727 (!) 106     Resp 10/05/23 1727 (!) 22     Temp 10/05/23 1727 97.9 F (36.6 C)     Temp Source 10/05/23 1727 Oral     SpO2 10/05/23 1727 100 %     Weight --      Height --      Head Circumference --      Peak Flow --      Pain Score 10/05/23 1734 10     Pain Loc --      Pain Education --      Exclude from Growth Chart --     Most recent vital signs: Vitals:   10/05/23 1727 10/05/23 2034  BP: (!) 152/101 (!) 212/112  Pulse: (!) 106 (!) 105  Resp: (!) 22 11  Temp: 97.9 F (36.6 C) 98.7 F (37.1 C)  SpO2: 100% 100%   Physical Exam: I have reviewed the vital signs and nursing notes. General: Awake, alert, ill-appearing.  Diaphoretic Head:  Atraumatic, normocephalic.   ENT:  EOM intact, PERRL. Oral mucosa is pink and moist with no lesions. Neck: Neck is supple with full  range of motion, No meningeal signs. Cardiovascular:  tachycardia, RR, No murmurs. Peripheral pulses palpable and equal bilaterally. Respiratory:  Symmetrical chest wall expansion.  No rhonchi, rales, or wheezes.  Good air movement throughout.  No use of accessory muscles.   Musculoskeletal:  No cyanosis or edema. Moving extremities with full ROM Abdomen:  Soft, generalized tenderness palpation throughout, nondistended. Neuro:  GCS 15, moving all four extremities, interacting appropriately. Speech clear. Psych:  Calm, appropriate.   Skin:  Warm, dry, no rash.     ED Results / Procedures / Treatments   Labs (all labs ordered are listed, but only abnormal results are displayed) Labs Reviewed  COMPREHENSIVE METABOLIC PANEL WITH GFR - Abnormal; Notable for the following components:      Result Value   CO2 13 (*)    Glucose, Bld 134 (*)    BUN 48 (*)    Creatinine, Ser 9.99 (*)    Total Protein 9.4 (*)    Alkaline Phosphatase 132 (*)    Total Bilirubin 1.7 (*)    GFR, Estimated  7 (*)    Anion gap 27 (*)    All other components within normal limits  CBC - Abnormal; Notable for the following components:   Hemoglobin 12.7 (*)    HCT 38.1 (*)    All other components within normal limits  LACTIC ACID, PLASMA - Abnormal; Notable for the following components:   Lactic Acid, Venous 2.4 (*)    All other components within normal limits  PROTIME-INR - Abnormal; Notable for the following components:   Prothrombin Time 15.8 (*)    All other components within normal limits  BETA-HYDROXYBUTYRIC ACID - Abnormal; Notable for the following components:   Beta-Hydroxybutyric Acid 3.87 (*)    All other components within normal limits  BLOOD GAS, VENOUS - Abnormal; Notable for the following components:   pCO2, Ven 29 (*)    pO2, Ven 73 (*)    Bicarbonate 18.8 (*)    Acid-base deficit 4.4 (*)    All other components within normal limits  CBG MONITORING, ED - Abnormal; Notable for the following  components:   Glucose-Capillary 150 (*)    All other components within normal limits  RESP PANEL BY RT-PCR (RSV, FLU A&B, COVID)  RVPGX2  CULTURE, BLOOD (ROUTINE X 2)  CULTURE, BLOOD (ROUTINE X 2)  LIPASE, BLOOD  LACTIC ACID, PLASMA  URINALYSIS, ROUTINE W REFLEX MICROSCOPIC  BETA-HYDROXYBUTYRIC ACID     EKG My EKG interpretation: Rate of 109, sinus tachycardia, left axis deviation.  No acute ST elevations or depressions   RADIOLOGY Independently interpreted chest x-ray and CT abdomen/pelvis with no acute findings.  Independently interpreted left upper extremity ultrasound with no acute findings   PROCEDURES:  Critical Care performed: Yes, see critical care procedure note(s)  .Critical Care  Performed by: Kandee Orion, MD Authorized by: Kandee Orion, MD   Critical care provider statement:    Critical care time (minutes):  30   Critical care was necessary to treat or prevent imminent or life-threatening deterioration of the following conditions:  Metabolic crisis   Critical care was time spent personally by me on the following activities:  Development of treatment plan with patient or surrogate, discussions with consultants, evaluation of patient's response to treatment, examination of patient, ordering and review of laboratory studies, ordering and review of radiographic studies, ordering and performing treatments and interventions, pulse oximetry, re-evaluation of patient's condition and review of old charts   I assumed direction of critical care for this patient from another provider in my specialty: no     Care discussed with: admitting provider      MEDICATIONS ORDERED IN ED: Medications  vancomycin (VANCOCIN) IVPB 1000 mg/200 mL premix (1,000 mg Intravenous New Bag/Given 10/05/23 2204)  metoCLOPramide  (REGLAN ) injection 5 mg (has no administration in time range)  lactated ringers  bolus 1,000 mL (1,000 mLs Intravenous New Bag/Given 10/05/23 1945)  ceFEPIme (MAXIPIME) 2  g in sodium chloride  0.9 % 100 mL IVPB (0 g Intravenous Stopped 10/05/23 1929)  metroNIDAZOLE (FLAGYL) IVPB 500 mg (500 mg Intravenous New Bag/Given 10/05/23 1943)  morphine  (PF) 4 MG/ML injection 4 mg (4 mg Intravenous Given 10/05/23 1935)  lactated ringers  bolus 1,000 mL (1,000 mLs Intravenous New Bag/Given 10/05/23 1945)  ondansetron  (ZOFRAN ) injection 4 mg (4 mg Intravenous Given 10/05/23 1941)  labetalol (NORMODYNE) injection 10 mg (10 mg Intravenous Given 10/05/23 2054)     IMPRESSION / MDM / ASSESSMENT AND PLAN / ED COURSE  I reviewed the triage vital signs and the nursing notes.  Differential diagnosis includes, but is not limited to, sepsis from abscess in the left arm, DKA, acute intra-abdominal infection such as pancreatitis versus colitis versus enteritis, diverticulitis  Patient's presentation is most consistent with acute presentation with potential threat to life or bodily function.  Patient is a 34 year old male presenting today for nausea, vomiting, generalized abdominal pain.  On arrival he is tachycardic and tachypneic.  Is diaphoretic and so was started on sepsis protocol with broad-spectrum antibiotics.  Laboratory workup shows no leukocytosis.  However he does have significant hypocarbia and significantly elevated anion gap acidosis at 27.  Although his blood glucose is only in the 130s, could potentially represent a euglycemic DKA with contribution from his vomiting and dehydration.  Given 2 L of fluid as well.  Beta hydroxybutyrate slightly elevated.  Lactic acid of 2.4.  Negative for COVID, flu, RSV.  CT abdomen/pelvis negative.  Chest x-ray unremarkable.  Ultrasound of left upper extremity due to his pain shows no evidence of abscess.  It appears at this time, most of his symptoms are from profound metabolic acidosis and dehydration with equivocal DKA.  Discussed case with hospitalist who has agreed to admit patient for further care.  VBG shows  improving acidosis with bicarb up to 19 following 2 L of fluid.  Currently withholding insulin  drip given low blood sugar.  The patient is on the cardiac monitor to evaluate for evidence of arrhythmia and/or significant heart rate changes.     FINAL CLINICAL IMPRESSION(S) / ED DIAGNOSES   Final diagnoses:  Ketoacidosis  High anion gap metabolic acidosis  Nausea and vomiting, unspecified vomiting type     Rx / DC Orders   ED Discharge Orders     None        Note:  This document was prepared using Dragon voice recognition software and may include unintentional dictation errors.   Kandee Orion, MD 10/05/23 2223

## 2023-10-05 NOTE — Consult Note (Signed)
 CODE SEPSIS - PHARMACY COMMUNICATION  **Broad-spectrum antimicrobials should be administered within one hour of sepsis diagnosis**  Time Code Sepsis call or page was received: 1807  Antibiotics ordered: Cefepime, Vancomycin, Metronidazole  Time of first antibiotic administration: 1842  Additional action taken by pharmacy: N/A  If necessary, name of provider/nurse contacted: N/A    Will M. Alva Jewels, PharmD Clinical Pharmacist 10/05/2023 6:08 PM

## 2023-10-05 NOTE — Sepsis Progress Note (Signed)
 Sepsis protocol monitored by eLink ?

## 2023-10-05 NOTE — ED Triage Notes (Signed)
 Pt arrived via [facility] from home with c/o black emesis that started today. 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

## 2023-10-06 ENCOUNTER — Encounter: Payer: Self-pay | Admitting: Internal Medicine

## 2023-10-06 ENCOUNTER — Other Ambulatory Visit (HOSPITAL_COMMUNITY): Payer: Self-pay

## 2023-10-06 DIAGNOSIS — T82868D Thrombosis of vascular prosthetic devices, implants and grafts, subsequent encounter: Secondary | ICD-10-CM | POA: Diagnosis not present

## 2023-10-06 DIAGNOSIS — E8729 Other acidosis: Secondary | ICD-10-CM | POA: Diagnosis not present

## 2023-10-06 DIAGNOSIS — R6510 Systemic inflammatory response syndrome (SIRS) of non-infectious origin without acute organ dysfunction: Secondary | ICD-10-CM

## 2023-10-06 DIAGNOSIS — R112 Nausea with vomiting, unspecified: Secondary | ICD-10-CM

## 2023-10-06 DIAGNOSIS — M79602 Pain in left arm: Secondary | ICD-10-CM

## 2023-10-06 DIAGNOSIS — T82868A Thrombosis of vascular prosthetic devices, implants and grafts, initial encounter: Secondary | ICD-10-CM

## 2023-10-06 HISTORY — DX: Nausea with vomiting, unspecified: R11.2

## 2023-10-06 LAB — BASIC METABOLIC PANEL WITH GFR
Anion gap: 23 — ABNORMAL HIGH (ref 5–15)
Anion gap: 21 — ABNORMAL HIGH (ref 5–15)
Anion gap: 18 — ABNORMAL HIGH (ref 5–15)
Anion gap: 20 — ABNORMAL HIGH (ref 5–15)
Anion gap: 17 — ABNORMAL HIGH (ref 5–15)
BUN: 57 mg/dL — ABNORMAL HIGH (ref 6–20)
BUN: 58 mg/dL — ABNORMAL HIGH (ref 6–20)
BUN: 59 mg/dL — ABNORMAL HIGH (ref 6–20)
BUN: 59 mg/dL — ABNORMAL HIGH (ref 6–20)
BUN: 65 mg/dL — ABNORMAL HIGH (ref 6–20)
CO2: 19 mmol/L — ABNORMAL LOW (ref 22–32)
CO2: 19 mmol/L — ABNORMAL LOW (ref 22–32)
CO2: 22 mmol/L (ref 22–32)
CO2: 19 mmol/L — ABNORMAL LOW (ref 22–32)
CO2: 21 mmol/L — ABNORMAL LOW (ref 22–32)
Calcium: 9.3 mg/dL (ref 8.9–10.3)
Calcium: 9 mg/dL (ref 8.9–10.3)
Calcium: 9.2 mg/dL (ref 8.9–10.3)
Calcium: 9 mg/dL (ref 8.9–10.3)
Calcium: 8.9 mg/dL (ref 8.9–10.3)
Chloride: 100 mmol/L (ref 98–111)
Chloride: 102 mmol/L (ref 98–111)
Chloride: 100 mmol/L (ref 98–111)
Chloride: 97 mmol/L — ABNORMAL LOW (ref 98–111)
Chloride: 96 mmol/L — ABNORMAL LOW (ref 98–111)
Creatinine, Ser: 11.15 mg/dL — ABNORMAL HIGH (ref 0.61–1.24)
Creatinine, Ser: 10.9 mg/dL — ABNORMAL HIGH (ref 0.61–1.24)
Creatinine, Ser: 11.41 mg/dL — ABNORMAL HIGH (ref 0.61–1.24)
Creatinine, Ser: 12.12 mg/dL — ABNORMAL HIGH (ref 0.61–1.24)
Creatinine, Ser: 13.18 mg/dL — ABNORMAL HIGH (ref 0.61–1.24)
GFR, Estimated: 6 mL/min — ABNORMAL LOW (ref >60)
GFR, Estimated: 6 mL/min — ABNORMAL LOW (ref >60)
GFR, Estimated: 6 mL/min — ABNORMAL LOW (ref >60)
GFR, Estimated: 5 mL/min — ABNORMAL LOW (ref >60)
GFR, Estimated: 5 mL/min — ABNORMAL LOW (ref >60)
Glucose, Bld: 164 mg/dL — ABNORMAL HIGH (ref 70–99)
Glucose, Bld: 117 mg/dL — ABNORMAL HIGH (ref 70–99)
Glucose, Bld: 137 mg/dL — ABNORMAL HIGH (ref 70–99)
Glucose, Bld: 152 mg/dL — ABNORMAL HIGH (ref 70–99)
Glucose, Bld: 122 mg/dL — ABNORMAL HIGH (ref 70–99)
Potassium: 4.8 mmol/L (ref 3.5–5.1)
Potassium: 4 mmol/L (ref 3.5–5.1)
Potassium: 4.1 mmol/L (ref 3.5–5.1)
Potassium: 3.8 mmol/L (ref 3.5–5.1)
Potassium: 3.6 mmol/L (ref 3.5–5.1)
Sodium: 142 mmol/L (ref 135–145)
Sodium: 142 mmol/L (ref 135–145)
Sodium: 140 mmol/L (ref 135–145)
Sodium: 136 mmol/L (ref 135–145)
Sodium: 134 mmol/L — ABNORMAL LOW (ref 135–145)

## 2023-10-06 LAB — GLUCOSE, CAPILLARY
Glucose-Capillary: 101 mg/dL — ABNORMAL HIGH (ref 70–99)
Glucose-Capillary: 102 mg/dL — ABNORMAL HIGH (ref 70–99)
Glucose-Capillary: 115 mg/dL — ABNORMAL HIGH (ref 70–99)
Glucose-Capillary: 118 mg/dL — ABNORMAL HIGH (ref 70–99)
Glucose-Capillary: 128 mg/dL — ABNORMAL HIGH (ref 70–99)
Glucose-Capillary: 128 mg/dL — ABNORMAL HIGH (ref 70–99)
Glucose-Capillary: 132 mg/dL — ABNORMAL HIGH (ref 70–99)
Glucose-Capillary: 133 mg/dL — ABNORMAL HIGH (ref 70–99)
Glucose-Capillary: 133 mg/dL — ABNORMAL HIGH (ref 70–99)
Glucose-Capillary: 135 mg/dL — ABNORMAL HIGH (ref 70–99)
Glucose-Capillary: 135 mg/dL — ABNORMAL HIGH (ref 70–99)
Glucose-Capillary: 136 mg/dL — ABNORMAL HIGH (ref 70–99)
Glucose-Capillary: 137 mg/dL — ABNORMAL HIGH (ref 70–99)
Glucose-Capillary: 139 mg/dL — ABNORMAL HIGH (ref 70–99)
Glucose-Capillary: 140 mg/dL — ABNORMAL HIGH (ref 70–99)
Glucose-Capillary: 159 mg/dL — ABNORMAL HIGH (ref 70–99)
Glucose-Capillary: 162 mg/dL — ABNORMAL HIGH (ref 70–99)
Glucose-Capillary: 166 mg/dL — ABNORMAL HIGH (ref 70–99)
Glucose-Capillary: 170 mg/dL — ABNORMAL HIGH (ref 70–99)
Glucose-Capillary: 175 mg/dL — ABNORMAL HIGH (ref 70–99)
Glucose-Capillary: 176 mg/dL — ABNORMAL HIGH (ref 70–99)

## 2023-10-06 LAB — CBG MONITORING, ED: Glucose-Capillary: 130 mg/dL — ABNORMAL HIGH (ref 70–99)

## 2023-10-06 LAB — HIV ANTIBODY (ROUTINE TESTING W REFLEX): HIV Screen 4th Generation wRfx: NONREACTIVE

## 2023-10-06 LAB — HEMOGLOBIN A1C
Hgb A1c MFr Bld: 6 % — ABNORMAL HIGH (ref 4.8–5.6)
Mean Plasma Glucose: 126 mg/dL

## 2023-10-06 LAB — BETA-HYDROXYBUTYRIC ACID
Beta-Hydroxybutyric Acid: 5.23 mmol/L — ABNORMAL HIGH (ref 0.05–0.27)
Beta-Hydroxybutyric Acid: 2.67 mmol/L — ABNORMAL HIGH (ref 0.05–0.27)
Beta-Hydroxybutyric Acid: 1.27 mmol/L — ABNORMAL HIGH (ref 0.05–0.27)
Beta-Hydroxybutyric Acid: 0.4 mmol/L — ABNORMAL HIGH (ref 0.05–0.27)

## 2023-10-06 LAB — MRSA NEXT GEN BY PCR, NASAL: MRSA by PCR Next Gen: NOT DETECTED

## 2023-10-06 LAB — PROCALCITONIN: Procalcitonin: 0.44 ng/mL

## 2023-10-06 MED ORDER — ALTEPLASE 2 MG IJ SOLR
2.0000 mg | Freq: Once | INTRAMUSCULAR | Status: AC
Start: 2023-10-07 — End: 2023-10-06
  Administered 2023-10-06: 2 mg
  Filled 2023-10-06: qty 2

## 2023-10-06 MED ORDER — POTASSIUM CHLORIDE 20 MEQ PO PACK: 40.0000 meq | PACK | Freq: Once | ORAL | Status: DC

## 2023-10-06 MED ORDER — INSULIN REGULAR(HUMAN) IN NACL 100-0.9 UT/100ML-% IV SOLN
INTRAVENOUS | Status: DC
  Administered 2023-10-06: 1.6 [IU]/h via INTRAVENOUS
  Filled 2023-10-06: qty 100

## 2023-10-06 MED ORDER — MIRTAZAPINE 15 MG PO TABS
15.0000 mg | ORAL_TABLET | Freq: Every day | ORAL | Status: DC
  Administered 2023-10-06 – 2023-10-07 (×2): 15 mg via ORAL
  Filled 2023-10-06 (×2): qty 1

## 2023-10-06 MED ORDER — HYDRALAZINE HCL 50 MG PO TABS
100.0000 mg | ORAL_TABLET | Freq: Three times a day (TID) | ORAL | Status: DC
  Administered 2023-10-06 – 2023-10-08 (×7): 100 mg via ORAL
  Filled 2023-10-06 (×7): qty 2

## 2023-10-06 MED ORDER — HEPARIN SODIUM (PORCINE) 5000 UNIT/ML IJ SOLN
5000.0000 [IU] | Freq: Three times a day (TID) | INTRAMUSCULAR | Status: DC
  Administered 2023-10-06 – 2023-10-08 (×7): 5000 [IU] via SUBCUTANEOUS
  Filled 2023-10-06 (×7): qty 1

## 2023-10-06 MED ORDER — ACETAMINOPHEN 325 MG PO TABS
650.0000 mg | ORAL_TABLET | Freq: Four times a day (QID) | ORAL | Status: DC | PRN
  Administered 2023-10-07: 650 mg via ORAL
  Filled 2023-10-06: qty 2

## 2023-10-06 MED ORDER — HYDRALAZINE HCL 20 MG/ML IJ SOLN
10.0000 mg | INTRAMUSCULAR | Status: DC | PRN
  Administered 2023-10-06: 5 mg via INTRAVENOUS
  Filled 2023-10-06: qty 1

## 2023-10-06 MED ORDER — LIDOCAINE-PRILOCAINE 2.5-2.5 % EX CREA: 1.0000 | TOPICAL_CREAM | CUTANEOUS | Status: DC | PRN

## 2023-10-06 MED ORDER — SODIUM CHLORIDE 0.9 % IV SOLN
12.5000 mg | Freq: Four times a day (QID) | INTRAVENOUS | Status: DC | PRN
  Administered 2023-10-06 – 2023-10-07 (×3): 12.5 mg via INTRAVENOUS
  Filled 2023-10-06 (×2): qty 0.5
  Filled 2023-10-06: qty 12.5

## 2023-10-06 MED ORDER — METOCLOPRAMIDE HCL 5 MG/ML IJ SOLN
10.0000 mg | Freq: Four times a day (QID) | INTRAMUSCULAR | Status: AC
  Administered 2023-10-06 (×2): 10 mg via INTRAVENOUS
  Filled 2023-10-06 (×2): qty 2

## 2023-10-06 MED ORDER — LABETALOL HCL 5 MG/ML IV SOLN
20.0000 mg | INTRAVENOUS | Status: DC | PRN
  Administered 2023-10-06 – 2023-10-08 (×10): 20 mg via INTRAVENOUS
  Filled 2023-10-06 (×7): qty 4

## 2023-10-06 MED ORDER — CLONIDINE HCL 0.2 MG/24HR TD PTWK
0.2000 mg | MEDICATED_PATCH | TRANSDERMAL | Status: DC
  Administered 2023-10-06: 0.2 mg via TRANSDERMAL
  Filled 2023-10-06: qty 1

## 2023-10-06 MED ORDER — HYDRALAZINE HCL 20 MG/ML IJ SOLN
5.0000 mg | INTRAMUSCULAR | Status: DC | PRN
  Administered 2023-10-06: 5 mg via INTRAVENOUS
  Filled 2023-10-06: qty 1

## 2023-10-06 MED ORDER — PANTOPRAZOLE SODIUM 40 MG IV SOLR
40.0000 mg | Freq: Every day | INTRAVENOUS | Status: DC
  Administered 2023-10-06 – 2023-10-08 (×3): 40 mg via INTRAVENOUS
  Filled 2023-10-06 (×3): qty 10

## 2023-10-06 MED ORDER — ACETAMINOPHEN 650 MG RE SUPP: 650.0000 mg | Freq: Four times a day (QID) | RECTAL | Status: DC | PRN

## 2023-10-06 MED ORDER — LACTATED RINGERS IV BOLUS
20.0000 mL/kg | Freq: Once | INTRAVENOUS | Status: AC
  Administered 2023-10-06: 1374 mL via INTRAVENOUS

## 2023-10-06 MED ORDER — MORPHINE SULFATE (PF) 2 MG/ML IV SOLN
2.0000 mg | INTRAVENOUS | Status: DC | PRN
  Administered 2023-10-06 – 2023-10-07 (×2): 2 mg via INTRAVENOUS
  Filled 2023-10-06 (×2): qty 1

## 2023-10-06 MED ORDER — CARVEDILOL 25 MG PO TABS
25.0000 mg | ORAL_TABLET | Freq: Two times a day (BID) | ORAL | Status: DC
  Administered 2023-10-06 – 2023-10-08 (×5): 25 mg via ORAL
  Filled 2023-10-06 (×5): qty 1

## 2023-10-06 MED ORDER — DEXTROSE 50 % IV SOLN: 0.0000 mL | INTRAVENOUS | Status: DC | PRN

## 2023-10-06 MED ORDER — AMLODIPINE BESYLATE 10 MG PO TABS
10.0000 mg | ORAL_TABLET | Freq: Every day | ORAL | Status: DC
Start: 2023-10-06 — End: 2023-10-08
  Administered 2023-10-06 – 2023-10-08 (×3): 10 mg via ORAL
  Filled 2023-10-06 (×3): qty 1

## 2023-10-06 MED ORDER — LORAZEPAM 2 MG/ML IJ SOLN
1.0000 mg | Freq: Four times a day (QID) | INTRAMUSCULAR | Status: DC | PRN
  Administered 2023-10-06: 1 mg via INTRAVENOUS
  Filled 2023-10-06: qty 1

## 2023-10-06 MED ORDER — CLONIDINE HCL 0.1 MG PO TABS: 0.1000 mg | ORAL_TABLET | Freq: Two times a day (BID) | ORAL | Status: DC

## 2023-10-06 MED ORDER — IRBESARTAN 150 MG PO TABS
300.0000 mg | ORAL_TABLET | Freq: Every day | ORAL | Status: DC
  Administered 2023-10-06 – 2023-10-07 (×2): 300 mg via ORAL
  Filled 2023-10-06 (×2): qty 2

## 2023-10-06 MED ORDER — STERILE WATER FOR INJECTION IJ SOLN
INTRAMUSCULAR | Status: AC
  Filled 2023-10-06: qty 10

## 2023-10-06 MED ORDER — SERTRALINE HCL 50 MG PO TABS
50.0000 mg | ORAL_TABLET | Freq: Every day | ORAL | Status: DC
  Administered 2023-10-06 – 2023-10-08 (×3): 50 mg via ORAL
  Filled 2023-10-06 (×3): qty 1

## 2023-10-06 MED ORDER — CHLORHEXIDINE GLUCONATE CLOTH 2 % EX PADS
6.0000 | MEDICATED_PAD | Freq: Every day | CUTANEOUS | Status: DC
  Administered 2023-10-06 – 2023-10-08 (×2): 6 via TOPICAL

## 2023-10-06 MED ORDER — DOXAZOSIN MESYLATE 4 MG PO TABS
4.0000 mg | ORAL_TABLET | Freq: Two times a day (BID) | ORAL | Status: DC
  Filled 2023-10-06: qty 1

## 2023-10-06 MED ORDER — ONDANSETRON HCL 4 MG/2ML IJ SOLN
4.0000 mg | Freq: Four times a day (QID) | INTRAMUSCULAR | Status: DC | PRN
Start: 2023-10-06 — End: 2023-10-08
  Administered 2023-10-06 – 2023-10-08 (×3): 4 mg via INTRAVENOUS
  Filled 2023-10-06 (×2): qty 2

## 2023-10-06 MED ORDER — ONDANSETRON HCL 4 MG PO TABS: 4.0000 mg | ORAL_TABLET | Freq: Four times a day (QID) | ORAL | Status: DC | PRN

## 2023-10-06 MED ORDER — HEPARIN SODIUM (PORCINE) 1000 UNIT/ML DIALYSIS
1000.0000 [IU] | INTRAMUSCULAR | Status: DC | PRN
  Administered 2023-10-08: 1000 [IU]

## 2023-10-06 MED ORDER — PENTAFLUOROPROP-TETRAFLUOROETH EX AERO: 1.0000 | INHALATION_SPRAY | CUTANEOUS | Status: DC | PRN

## 2023-10-06 MED ORDER — POTASSIUM CHLORIDE 10 MEQ/100ML IV SOLN: 10.0000 meq | INTRAVENOUS | Status: DC

## 2023-10-06 NOTE — H&P (View-Only) (Signed)
 Hospital Consult    Reason for Consult:  Non Functioning A/V Fistula Requesting Physician:  Dr Hines Ludwig MD MRN #:  [id number]  History of Present Illness: This is a 33 y.o. male with history of ESRD on dialysis, diabetes presenting today for vomiting. Patient states over the past 24 hours he has had generalized abdominal pain associated with vomiting. He has not been able to eat or drink anything. Feels feverish as well. Otherwise denies cough, congestion, shortness of breath, chest pain. No diarrhea or constipation. Reportedly had black appearing vomit. Denies any black appearing stools though. Also having pain in his left arm which she has had since his initial surgery for fistulas.   Past Medical History:  Diagnosis Date   Adjustment disorder with mixed anxiety and depressed mood    Anemia due to chronic kidney disease    Asthma    Cannabis use disorder    Diabetes mellitus type 2, insulin  dependent (HCC)    DKA (diabetic ketoacidosis) (HCC)    End stage renal disease on dialysis (HCC)    Hyperlipidemia    Hypertension associated with type 2 diabetes mellitus (HCC)    Intractable vomiting with nausea 10/06/2023   Obstructive sleep apnea     Past Surgical History:  Procedure Laterality Date   A/V FISTULAGRAM Left 09/11/2023   Procedure: A/V Fistulagram;  Surgeon: Celso College, MD;  Location: [facility];  Service: Cardiovascular;  Laterality: Left;   AV FISTULA PLACEMENT Left 07/17/2023   Procedure: ARTERIOVENOUS (AV) FISTULA CREATION (RADIALCEPHALIC);  Surgeon: Celso College, MD;  Location: [facility] ORS;  Service: Vascular;  Laterality: Left;   CAPD REMOVAL N/A 07/17/2023   Procedure: CONTINUOUS AMBULATORY PERITONEAL DIALYSIS  (CAPD) CATHETER REMOVAL;  Surgeon: Celso College, MD;  Location: [facility] ORS;  Service: Vascular;  Laterality: N/A;   DIALYSIS/PERMA CATHETER INSERTION  10/2022   PARS PLANA VITRECTOMY W/ ENDOLASER PANRETINAL PHOTOCOAGULATION Right 06/29/2018   PERITONEAL  CATHETER INSERTION  03/04/2023   TONSILLECTOMY AND ADENOIDECTOMY  2002    No Known Allergies  Prior to Admission medications   Medication Sig Start Date End Date Taking? Authorizing Provider  albuterol (VENTOLIN HFA) 108 (90 Base) MCG/ACT inhaler Inhale 2 puffs into the lungs every 6 (six) hours as needed for wheezing or shortness of breath. Patient not taking: Reported on 09/01/2023    [provider]  amLODipine  (NORVASC ) 10 MG tablet Take 10 mg by mouth daily.    [provider]  atorvastatin  (LIPITOR) 20 MG tablet Take 1 tablet by mouth daily. 04/16/22 08/27/24  [provider]  Calcium  Acetate 667 MG TABS Take 1,334 mg by mouth 3 (three) times daily with meals.    [provider]  carvedilol  (COREG ) 25 MG tablet Take 25 mg by mouth 2 (two) times daily. 03/18/22   [provider]  cloNIDine (CATAPRES) 0.1 MG tablet Take 0.1 mg by mouth 2 (two) times daily.    [provider]  Continuous Glucose Receiver (DEXCOM G7 RECEIVER) DEVI by Does not apply route.    [provider]  Continuous Glucose Sensor (DEXCOM G7 SENSOR) MISC by Does not apply route.    [provider]  doxazosin (CARDURA) 4 MG tablet Take 4 mg by mouth 2 (two) times daily.    [provider]  EPINEPHrine 0.3 MG/0.3ML SOSY Inject 0.3 mg into the muscle once.    [provider]  furosemide (LASIX) 40 MG tablet Take 40 mg by mouth 2 (two) times daily.  On Dialysis days    [provider]  gabapentin  (NEURONTIN ) 100 MG capsule Take 100 mg by mouth 3 (three) times a week. With dialysis M-W-F    [provider]  hydrALAZINE  (APRESOLINE ) 100 MG tablet Take 100 mg by mouth 3 (three) times daily.    [provider]  insulin  glargine (LANTUS ) 100 UNIT/ML injection Inject 12 Units into the skin daily with supper.    [provider]  insulin  lispro (HUMALOG) 100 UNIT/ML KwikPen Inject 2-4 Units into the skin 3 (three)  times daily with meals. 03/19/22   [provider]  Insulin  Pen Needle 29G X MISC Please dispense 1 box of 100 insulin  needles and syringes 09/23/19   Bryson Carbine, MD  irbesartan (AVAPRO) 300 MG tablet Take 300 mg by mouth at bedtime.    [provider]  mirtazapine  (REMERON ) 15 MG tablet Take 15 mg by mouth at bedtime.    [provider]  promethazine  (PHENERGAN ) 12.5 MG tablet Take 12.5 mg by mouth every 6 (six) hours as needed for nausea or vomiting.    [provider]  sertraline (ZOLOFT) 50 MG tablet Take 50 mg by mouth daily.    [provider]  sodium bicarbonate 650 MG tablet Take 650 mg by mouth 2 (two) times daily.    [provider]  traMADol  (ULTRAM ) 50 MG tablet Take 1 tablet (50 mg total) by mouth every 6 (six) hours as needed. 07/17/23 07/16/24  Dew, Jason S, MD  traZODone (DESYREL) 50 MG tablet Take 50 mg by mouth at bedtime.    [provider]    Social History   Socioeconomic History   Marital status: Single    Spouse name: Not on file   Number of children: 0   Years of education: Not on file   Highest education level: Not on file  Occupational History   Not on file  Tobacco Use   Smoking status: Former    Current packs/day: 0.00    Types: Cigarettes    Quit date: 05/2023    Years since quitting: 0.3   Smokeless tobacco: Never  Vaping Use   Vaping status: Never Used  Substance and Sexual Activity   Alcohol use: Not Currently   Drug use: Yes    Types: Marijuana    Comment: about a month ago   Sexual activity: Not on file  Other Topics Concern   Not on file  Social History Narrative   Lives with mother   Social Drivers of Health   Financial Resource Strain: Low Risk  (12/19/2022)   Received from [facility]   Overall Financial Resource Strain (CARDIA)    Difficulty of Paying Living Expenses: Not hard at all  Recent Concern: Financial Resource Strain - High Risk (10/22/2022)   Received  from [facility]   Overall Financial Resource Strain (CARDIA)    Difficulty of Paying Living Expenses: Hard  Food Insecurity: No Food Insecurity (12/19/2022)   Received from [facility]   Hunger Vital Sign    Worried About Running Out of Food in the Last Year: Never true    Ran Out of Food in the Last Year: Never true  Recent Concern: Food Insecurity - Food Insecurity Present (10/22/2022)   Received from [facility]   Hunger Vital Sign    Worried About Running Out of Food in the Last Year: Sometimes true    Ran Out of  Food in the Last Year: Never true  Transportation Needs: No Transportation Needs (10/06/2023)   PRAPARE - Administrator, Civil Service (Medical): No    Lack of Transportation (Non-Medical): No  Physical Activity: Not on file  Stress: No Stress Concern Present (03/04/2023)   Received from [facility] - Occupational Stress Questionnaire    Feeling of Stress : Not at all  Social Connections: Unknown (02/03/2023)   Received from [facility]   Social Network    Social Network: Not on file  Intimate Partner Violence: Not At Risk (10/06/2023)   Humiliation, Afraid, Rape, and Kick questionnaire    Fear of Current or Ex-Partner: No    Emotionally Abused: No    Physically Abused: No    Sexually Abused: No     Family History  Problem Relation Age of Onset   Healthy Mother     ROS: Otherwise negative unless mentioned in HPI  Physical Examination  Vitals:   10/06/23 1500 10/06/23 1532  BP: (!) 151/73 (!) 145/73  Pulse: 87   Resp: 12   Temp:    SpO2: 100%    Body mass index is 21.12 kg/m.  General: Semi Awake and alert. Very ill appearing. Diaphoretic Gait: Not observed HENT: WNL, normocephalic Pulmonary: labored breathing, without Rales, rhonchi,  wheezing Cardiac: regular, Tachycardia in the 120's, without  Murmurs, rubs or gallops; without carotid  bruits Abdomen: Positive bowel sounds throughout,  soft, NT/ND, no masses Skin: without rashes. Dialysis Access to right Chest Vascular Exam/Pulses: Palpable Pulses throughout. Unable to palpate pulses in left arm fistula. No thrill or bruit noted.  Extremities: without ischemic changes, without Gangrene , without cellulitis; without open wounds;  Musculoskeletal: no muscle wasting or atrophy  Neurologic: A&O X Name only;  No focal weakness or paresthesias are detected; speech is disoriented. Garbled. Psychiatric:  The pt has flat affect. Lymph:  Unremarkable  CBC    Component Value Date/Time   WBC 8.4 10/05/2023 1728   RBC 4.28 10/05/2023 1728   HGB 12.7 (L) 10/05/2023 1728   HCT 38.1 (L) 10/05/2023 1728   PLT 360 10/05/2023 1728   MCV 89.0 10/05/2023 1728   MCH 29.7 10/05/2023 1728   MCHC 33.3 10/05/2023 1728   RDW 14.8 10/05/2023 1728   LYMPHSABS 1.5 07/10/2023 1145   MONOABS 0.6 07/10/2023 1145   EOSABS 0.7 (H) 07/10/2023 1145   BASOSABS 0.1 07/10/2023 1145    BMET    Component Value Date/Time   NA 140 10/06/2023 0832   K 4.1 10/06/2023 0832   CL 100 10/06/2023 0832   CO2 22 10/06/2023 0832   GLUCOSE 137 (H) 10/06/2023 0832   BUN 59 (H) 10/06/2023 0832   CREATININE 11.41 (H) 10/06/2023 0832   CALCIUM  9.2 10/06/2023 0832   GFRNONAA 6 (L) 10/06/2023 0832   GFRAA >60 09/23/2019 0958    COAGS: Lab Results  Component Value Date   INR 1.2 10/05/2023     Non-Invasive Vascular Imaging:   EXAM: LEFT UPPER EXTREMITY VENOUS DOPPLER ULTRASOUND   TECHNIQUE: Gray-scale sonography with graded compression, as well as color Doppler and duplex ultrasound were performed to evaluate the upper extremity deep venous system from the level of the subclavian vein and including the jugular, axillary, basilic, radial, ulnar and upper cephalic vein. Spectral Doppler was utilized to evaluate flow at rest and with distal augmentation maneuvers.   COMPARISON:  Report from an  ultrasound in an outside institution five  days ago, images not available.   FINDINGS: Contralateral Subclavian Vein: Respiratory phasicity is normal and symmetric with the symptomatic side. No evidence of thrombus. Normal compressibility.   Internal Jugular Vein: No evidence of thrombus. Normal compressibility, respiratory phasicity and response to augmentation.   Subclavian Vein: No evidence of thrombus. Normal compressibility, respiratory phasicity and response to augmentation.   Axillary Vein: No evidence of thrombus. Normal compressibility, respiratory phasicity and response to augmentation.   Cephalic Vein: The cephalic AV fistula described on prior exam is not evaluated. Flow is demonstrated in the cephalic vein. No fluid collection is seen on the current exam   Basilic Vein: No evidence of thrombus. Normal compressibility, respiratory phasicity and response to augmentation.   Brachial Veins: No evidence of thrombus. Normal compressibility, respiratory phasicity and response to augmentation.   Radial Veins: No evidence of thrombus. Normal compressibility, respiratory phasicity and response to augmentation.   Ulnar Veins: Fistula at the wrist which appears thrombosed.   Other Findings:  No evidence of focal fluid collection.   IMPRESSION: 1. 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 2. The cephalic AV fistula described on prior outside exam with associated collection is not seen on provided images. Vascular surgery follow-up is recommended. 3. No evidence of left upper extremity DVT.  Statin:  Yes.   Beta Blocker:  Yes.   Aspirin:  No. ACEI:  No. ARB:  Yes.   CCB use:  Yes Other antiplatelets/anticoagulants:  No.    ASSESSMENT/PLAN: This is a 33 y.o. male who presents to [facility] emergency department with abdominal pain, intractable vomiting, DKA and hypertensive crisis.  Patient is a known dialysis patient with a fistula in his left upper extremity.  He was recently seen  at [facility] in the emergency department for AV fistula complications.  At that time he underwent an ultrasound which showed no evidence of obstruction of any central veins.  Ultrasound here on 10/05/2023 shows thrombosed fistula at the wrist.  Patient currently has a right chest dialysis tunneled permacatheter in place for dialysis.  Vascular surgery will plan on taking the patient to the vascular lab for a left upper extremity fistulogram with possible intervention later this week as he recovers from DKA and his hypertensive crisis.   -I discussed the case with Dr. Mikki Alexander MD and he agrees with the plan   Annamaria Barrette [facility] 10/06/2023 3:45 PM

## 2023-10-06 NOTE — Assessment & Plan Note (Signed)
 Known AV fistula thrombosis left arm Can consider vascular consult in the a.m. Pain control

## 2023-10-06 NOTE — Assessment & Plan Note (Addendum)
 Lactic acidosis Possible sepsis Patient with tachycardia and tachypnea but otherwise not meeting sepsis.  Afebrile with normal WBC Lactic acid was 2.4--1.7, but no definite infectious source Received broad-spectrum antibiotics in the ED with cefepime vancomycin and Flagyl Will get procalcitonin to help with determination of sepsis Holding off on additional antibiotics for now

## 2023-10-06 NOTE — Assessment & Plan Note (Signed)
 SBP above 200 on arrival IV hydralazine  as needed.  Hold oral meds due to intractable vomiting Patient on multiple oral antihypertensives including amlodipine , carvedilol , clonidine, doxazosin, hydralazine , irbesartan as well as furosemide, pending med rec

## 2023-10-06 NOTE — Progress Notes (Signed)
 Received patient in bed in ICU06 Alert and oriented x4, complaints of nausea, informed primary RN. Given PRN meds by primary RN Informed consent signed and in chart. TX duration: 3.5 Pt uneasy, and uncomfortable to bed frequent turns noted. HD Arterial catheter pressure was increasing hence switched to venous access, still increasing pressure noted, flushing done. BFR lowered to 200. Still pressure is noted increasing. Provider updated and orders made and carried out. HD terminated and altiplase instill on the both HD catheter. Hand-off given to patient's nurse.   Access used: HD perm catheter  Access issues: Prem catheter increasing pressure, alteplase in place per order  Total UF removed: see table below  Medication(s) given: none  Post HD VS: 141/68  Post HD weight: unable to wt pt.    Handed over to care RN    10/06/23 2312  Vitals  BP (!) 141/68  MAP (mmHg) 90  BP Location Right Arm  BP Method Automatic  Patient Position (if appropriate) Lying  Pulse Rate Source Monitor  Oxygen Therapy  O2 Device Nasal Cannula  O2 Flow Rate (L/min) 2 L/min  Patient Activity (if Appropriate) In bed  Pulse Oximetry Type Continuous  During Treatment Monitoring  Intra-Hemodialysis Comments See progress note (post treatment)  Post Treatment  Dialyzer Clearance Lightly streaked  Hemodialysis Intake (mL) 0 mL  Liters Processed 6.2  Fluid Removed (mL) -0.2 mL  Tolerated HD Treatment No (Comment) (Pt uneasy and uncomforatble on bed frequest turns noted. HD perm catheter pressure increasing, provider updated. ordered for altiplase and resume HD next day)  Post-Hemodialysis Comments HD perm catheter pressure increasing, provider updated. ordered for actiplase and rresume HD next day. Pt AxO x 4, hiccups noted,  Hemodialysis Catheter Right Subclavian  No placement date or time found.   Orientation: Right  Access Location: Subclavian  Site Condition  (Venous pressure increasing,)    Jonael Paradiso E.  Lorenza Shakir, BSN, RN [facility]

## 2023-10-06 NOTE — Assessment & Plan Note (Addendum)
 Euglycemic DKA versus starvation ketosis Blood glucose 134 and patient is not on any of the SGLT2 meds Venous pH normal but beta hydroxy increasing from 3.87-5.26 Probably starvation ketosis from intractable vomiting We will treat as a combination of both given the uncertainty D10 and insulin  infusion with close CBG monitor

## 2023-10-06 NOTE — Assessment & Plan Note (Addendum)
 Darrell Walls

## 2023-10-06 NOTE — H&P (Addendum)
 History and Physical    Patient: Darrell Walls ZOX:[id number] DOB: [birth year] DOA: 10/05/2023 DOS: the patient was seen and examined on 10/06/2023 PCP: Theda Finical, MD  Patient coming from: Home  Chief Complaint:  Chief Complaint  Patient presents with   Emesis    HPI: Darrell Walls is a 33 y.o. male with medical history significant for ESRD on HD MWF, HTN, OSA on CPAP, anemia of CKD, presenting with abdominal pain and intractable vomiting.  He denied diarrhea, fever, chest pain or shortness of breath.  Presented similarly back in 2023 with diagnosis of DKA with UTI.  Separately, patient complains of pain in his left arm, at the site of his AV fistula for which he was seen on 5/6 at the ED at [facility] that showed no evidence of obstruction in the central veins. ED course and data review: BP 212/110 with pulse 106, respirations 22, normothermic and O2 sat of 100% on room air. Labs notable for unremarkable CBC with WBC 8.4 and hemoglobin 12.7 Noted to have anion gap metabolic acidosis with anion gap 27, bicarb 13 but with normal blood glucose of 134 Venous pH 7.42 but beta hydroxybutyric acid 3.87 -->5.26 (Patient not on SGL P2 and does not use alcohol) Lipase and LFTs WNL except for alk phos of 132 and total bili of 1.7  Left venous ultrasound showing thrombosed fistula left wrist CT abdomen and pelvis showing small anterior pericardial effusion otherwise no findings to explain abdominal pain vomiting Chest x-ray nonacute  Patient treated with 2 L LR and started on sepsis fluids cefepime vancomycin and Flagyl and treated with morphine  for pain Hospitalist consulted for admission.      Past Medical History:  Diagnosis Date   Adjustment disorder with mixed anxiety and depressed mood    Anemia due to chronic kidney disease    Asthma    Cannabis use disorder    Diabetes mellitus type 2, insulin  dependent (HCC)    DKA (diabetic ketoacidosis) (HCC)    End stage renal disease on  dialysis (HCC)    Hyperlipidemia    Hypertension associated with type 2 diabetes mellitus (HCC)    Intractable vomiting with nausea 10/06/2023   Obstructive sleep apnea    Past Surgical History:  Procedure Laterality Date   A/V FISTULAGRAM Left 09/11/2023   Procedure: A/V Fistulagram;  Surgeon: Celso College, MD;  Location: [facility] INVASIVE CV LAB;  Service: Cardiovascular;  Laterality: Left;   AV FISTULA PLACEMENT Left 07/17/2023   Procedure: ARTERIOVENOUS (AV) FISTULA CREATION (RADIALCEPHALIC);  Surgeon: Celso College, MD;  Location: [facility] ORS;  Service: Vascular;  Laterality: Left;   CAPD REMOVAL N/A 07/17/2023   Procedure: CONTINUOUS AMBULATORY PERITONEAL DIALYSIS  (CAPD) CATHETER REMOVAL;  Surgeon: Celso College, MD;  Location: [facility] ORS;  Service: Vascular;  Laterality: N/A;   DIALYSIS/PERMA CATHETER INSERTION  10/2022   PARS PLANA VITRECTOMY W/ ENDOLASER PANRETINAL PHOTOCOAGULATION Right 06/29/2018   PERITONEAL CATHETER INSERTION  03/04/2023   TONSILLECTOMY AND ADENOIDECTOMY  2002   Social History:  reports that he quit smoking about 4 months ago. His smoking use included cigarettes. He has never used smokeless tobacco. He reports that he does not currently use alcohol. He reports current drug use. Drug: Marijuana.  No Known Allergies  Family History  Problem Relation Age of Onset   Healthy Mother     Prior to Admission medications   Medication Sig Start Date End Date Taking? Authorizing Provider  albuterol (VENTOLIN HFA) 108 (90 Base) MCG/ACT inhaler Inhale  2 puffs into the lungs every 6 (six) hours as needed for wheezing or shortness of breath. Patient not taking: Reported on 09/01/2023    [provider]  amLODipine  (NORVASC ) 10 MG tablet Take 10 mg by mouth daily.    [provider]  atorvastatin  (LIPITOR) 20 MG tablet Take 1 tablet by mouth daily. 04/16/22 08/27/24  [provider]  Calcium  Acetate 667 MG TABS Take 1,334 mg by mouth 3 (three) times daily with  meals.    [provider]  carvedilol  (COREG ) 25 MG tablet Take 25 mg by mouth 2 (two) times daily. 03/18/22   [provider]  cloNIDine (CATAPRES) 0.1 MG tablet Take 0.1 mg by mouth 2 (two) times daily.    [provider]  Continuous Glucose Receiver (DEXCOM G7 RECEIVER) DEVI by Does not apply route.    [provider]  Continuous Glucose Sensor (DEXCOM G7 SENSOR) MISC by Does not apply route.    [provider]  doxazosin (CARDURA) 4 MG tablet Take 4 mg by mouth 2 (two) times daily.    [provider]  EPINEPHrine 0.3 MG/0.3ML SOSY Inject 0.3 mg into the muscle once.    [provider]  furosemide (LASIX) 40 MG tablet Take 40 mg by mouth 2 (two) times daily. On Dialysis days    [provider]  gabapentin  (NEURONTIN ) 100 MG capsule Take 100 mg by mouth 3 (three) times a week. With dialysis M-W-F    [provider]  hydrALAZINE  (APRESOLINE ) 100 MG tablet Take 100 mg by mouth 3 (three) times daily.    [provider]  insulin  glargine (LANTUS ) 100 UNIT/ML injection Inject 12 Units into the skin daily with supper.    [provider]  insulin  lispro (HUMALOG) 100 UNIT/ML KwikPen Inject 2-4 Units into the skin 3 (three) times daily with meals. 03/19/22   [provider]  Insulin  Pen Needle 29G X MISC Please dispense 1 box of 100 insulin  needles and syringes 09/23/19   Bryson Carbine, MD  irbesartan (AVAPRO) 300 MG tablet Take 300 mg by mouth at bedtime.    [provider]  mirtazapine  (REMERON ) 15 MG tablet Take 15 mg by mouth at bedtime.    [provider]  promethazine  (PHENERGAN ) 12.5 MG tablet Take 12.5 mg by mouth every 6 (six) hours as needed for nausea or vomiting.    [provider]  sertraline (ZOLOFT) 50 MG tablet Take 50 mg by mouth daily.    [provider]  sodium bicarbonate 650 MG tablet Take 650 mg by mouth 2 (two) times daily.    [provider]  traMADol  (ULTRAM ) 50 MG tablet Take 1 tablet (50 mg total) by mouth every 6 (six) hours as needed. 07/17/23 07/16/24  Dew, Jason S, MD  traZODone (DESYREL) 50 MG tablet Take 50 mg by mouth at bedtime.    [provider]    Physical Exam: Vitals:   10/05/23 1727 10/05/23 2034 10/05/23 2232  BP: (!) 152/101 (!) 212/112 (!) 185/81  Pulse: (!) 106 (!) 105 (!) 101  Resp: (!) 22 11 (!) 22  Temp: 97.9 F (36.6 C) 98.7 F (37.1 C) 98.4 F (36.9 C)  TempSrc: Oral Axillary Oral  SpO2: 100% 100% 100%   Physical Exam Vitals and nursing note reviewed.  Constitutional:      General: He is not in acute distress.    Appearance: He is ill-appearing.  HENT:     Head: Normocephalic and  atraumatic.  Cardiovascular:     Rate and Rhythm: Regular rhythm. Tachycardia present.     Heart sounds: Normal heart sounds.  Pulmonary:     Effort: Pulmonary effort is normal. Tachypnea present.     Breath sounds: Normal breath sounds.  Abdominal:     Palpations: Abdomen is soft.     Tenderness: There is no abdominal tenderness.  Musculoskeletal:     Right lower leg: No edema.     Left lower leg: No edema.  Neurological:     Mental Status: Mental status is at baseline.     Labs on Admission: I have personally reviewed following labs and imaging studies  CBC: Recent Labs  Lab 10/05/23 1728  WBC 8.4  HGB 12.7*  HCT 38.1*  MCV 89.0  PLT 360   Basic Metabolic Panel: Recent Labs  Lab 10/05/23 1728  NA 139  K 4.5  CL 99  CO2 13*  GLUCOSE 134*  BUN 48*  CREATININE 9.99*  CALCIUM  9.8   GFR: CrCl cannot be calculated (Unknown ideal weight.). Liver Function Tests: Recent Labs  Lab 10/05/23 1728  AST 24  ALT 21  ALKPHOS 132*  BILITOT 1.7*  PROT 9.4*  ALBUMIN 4.8   Recent Labs  Lab 10/05/23 1728  LIPASE 24   No results for input(s): "AMMONIA" in the last 168 hours. Coagulation Profile: Recent Labs  Lab 10/05/23 1728  INR 1.2   Cardiac Enzymes: No  results for input(s): "CKTOTAL", "CKMB", "CKMBINDEX", "TROPONINI" in the last 168 hours. BNP (last 3 results) No results for input(s): "PROBNP" in the last 8760 hours. HbA1C: No results for input(s): "HGBA1C" in the last 72 hours. CBG: Recent Labs  Lab 10/05/23 1813  GLUCAP 150*   Lipid Profile: No results for input(s): "CHOL", "HDL", "LDLCALC", "TRIG", "CHOLHDL", "LDLDIRECT" in the last 72 hours. Thyroid Function Tests: No results for input(s): "TSH", "T4TOTAL", "FREET4", "T3FREE", "THYROIDAB" in the last 72 hours. Anemia Panel: No results for input(s): "VITAMINB12", "FOLATE", "FERRITIN", "TIBC", "IRON", "RETICCTPCT" in the last 72 hours. Urine analysis:    Component Value Date/Time   COLORURINE YELLOW (A) 05/16/2022 0410   APPEARANCEUR HAZY (A) 05/16/2022 0410   LABSPEC 1.012 05/16/2022 0410   PHURINE 5.0 05/16/2022 0410   GLUCOSEU >=500 (A) 05/16/2022 0410   HGBUR SMALL (A) 05/16/2022 0410   BILIRUBINUR NEGATIVE 05/16/2022 0410   KETONESUR NEGATIVE 05/16/2022 0410   PROTEINUR >=300 (A) 05/16/2022 0410   NITRITE NEGATIVE 05/16/2022 0410   LEUKOCYTESUR NEGATIVE 05/16/2022 0410    Radiological Exams on Admission: US  Venous Img Upper Uni Left Result Date: 10/05/2023 CLINICAL DATA:  left upper extremity pain and swelling, multiple prior fistula surgeries with stenosis, reportedly recent abscess in extremity, evaluting for clot vs cellulitis vs abscess EXAM: LEFT UPPER EXTREMITY VENOUS DOPPLER ULTRASOUND TECHNIQUE: Gray-scale sonography with graded compression, as well as color Doppler and duplex ultrasound were performed to evaluate the upper extremity deep venous system from the level of the subclavian vein and including the jugular, axillary, basilic, radial, ulnar and upper cephalic vein. Spectral Doppler was utilized to evaluate flow at rest and with distal augmentation maneuvers. COMPARISON:  Report from an ultrasound in an outside institution five days ago, images not  available. FINDINGS: Contralateral Subclavian Vein: Respiratory phasicity is normal and symmetric with the symptomatic side. No evidence of thrombus. Normal compressibility. Internal Jugular Vein: No evidence of thrombus. Normal compressibility, respiratory phasicity and response to augmentation. Subclavian Vein: No evidence of thrombus. Normal compressibility, respiratory phasicity and response to  augmentation. Axillary Vein: No evidence of thrombus. Normal compressibility, respiratory phasicity and response to augmentation. Cephalic Vein: The cephalic AV fistula described on prior exam is not evaluated. Flow is demonstrated in the cephalic vein. No fluid collection is seen on the current exam Basilic Vein: No evidence of thrombus. Normal compressibility, respiratory phasicity and response to augmentation. Brachial Veins: No evidence of thrombus. Normal compressibility, respiratory phasicity and response to augmentation. Radial Veins: No evidence of thrombus. Normal compressibility, respiratory phasicity and response to augmentation. Ulnar Veins: Fistula at the wrist which appears thrombosed. Other Findings:  No evidence of focal fluid collection. IMPRESSION: 1. 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 2. The cephalic AV fistula described on prior outside exam with associated collection is not seen on provided images. Vascular surgery follow-up is recommended. 3. No evidence of left upper extremity DVT. Electronically Signed   By: Chadwick Colonel M.D.   On: 10/05/2023 21:39   CT ABDOMEN PELVIS WO CONTRAST Result Date: 10/05/2023 CLINICAL DATA:  Black emesis. EXAM: CT ABDOMEN AND PELVIS WITHOUT CONTRAST TECHNIQUE: Multidetector CT imaging of the abdomen and pelvis was performed following the standard protocol without IV contrast. RADIATION DOSE REDUCTION: This exam was performed according to the departmental dose-optimization program which includes automated exposure control, adjustment of the mA and/or kV according to  patient size and/or use of iterative reconstruction technique. COMPARISON:  May 15, 2022 FINDINGS: Lower chest: A 12 mm thick anterior pericardial effusion is noted. Hepatobiliary: No focal liver abnormality is seen. No gallstones, gallbladder wall thickening, or biliary dilatation. Pancreas: Unremarkable. No pancreatic ductal dilatation or surrounding inflammatory changes. Spleen: Normal in size without focal abnormality. Adrenals/Urinary Tract: Adrenal glands are unremarkable. Kidneys are normal, without renal calculi, focal lesion, or hydronephrosis. Urinary bladder is poorly distended. Mild diffuse urinary bladder wall thickening is seen. Stomach/Bowel: Stomach is within normal limits. Appendix appears normal. Stool is seen throughout the large bowel. No evidence of bowel wall thickening, distention, or inflammatory changes. Vascular/Lymphatic: Aortic atherosclerosis with diffuse calcification of the arterial structures within the abdomen and pelvis. No enlarged abdominal or pelvic lymph nodes. Reproductive: Prostate is unremarkable. Other: No abdominal wall hernia or abnormality. No abdominopelvic ascites. Musculoskeletal: No acute or significant osseous findings. IMPRESSION: 1. Small anterior pericardial effusion. 2. Aortic atherosclerosis. Electronically Signed   By: Virgle Grime M.D.   On: 10/05/2023 20:34   DG Chest Port 1 View Result Date: 10/05/2023 CLINICAL DATA:  Suspected sepsis.  Vomiting. EXAM: PORTABLE CHEST 1 VIEW COMPARISON:  Radiograph 04/27/2019 FINDINGS: Right-sided dialysis catheter tip in the right atrium.The cardiomediastinal contours are normal. The lungs are clear. Pulmonary vasculature is normal. No consolidation, pleural effusion, or pneumothorax. No acute osseous abnormalities are seen. IMPRESSION: No active disease. Electronically Signed   By: Chadwick Colonel M.D.   On: 10/05/2023 19:43   Data Reviewed for HPI: Relevant notes from primary care and specialist visits,  past discharge summaries as available in EHR, including Care Everywhere. Prior diagnostic testing as pertinent to current admission diagnoses Updated medications and problem lists for reconciliation ED course, including vitals, labs, imaging, treatment and response to treatment Triage notes, nursing and pharmacy notes and ED provider's notes Notable results as noted above in HPI      Assessment and Plan: * Ketoacidosis Euglycemic DKA versus starvation ketosis Blood glucose 134 and patient is not on any of the SGLT2 meds Venous pH normal but beta hydroxy increasing from 3.87-5.26 Probably starvation ketosis from intractable vomiting We will treat as a combination of both given  the uncertainty D10 and insulin  infusion with close CBG monitor  SIRS (systemic inflammatory response syndrome) (HCC) Lactic acidosis Possible sepsis Patient with tachycardia and tachypnea but otherwise not meeting sepsis.  Afebrile with normal WBC Lactic acid was 2.4--1.7, but no definite infectious source Received broad-spectrum antibiotics in the ED with cefepime vancomycin and Flagyl Will get procalcitonin to help with determination of sepsis Holding off on additional antibiotics for now   Abdominal pain Intractable nausea and vomiting ?Gastroparesis CT abdomen and pelvis nonacute, lipase and LFTs unremarkable IV Protonix , IV antiemetics Received 2 L bolus in the ED   Hypertensive urgency SBP above 200 on arrival IV hydralazine  as needed.  Hold oral meds due to intractable vomiting Patient on multiple oral antihypertensives including amlodipine , carvedilol , clonidine, doxazosin, hydralazine , irbesartan as well as furosemide, pending med rec  ESRD on hemodialysis [facility]) Nephrology consult for continuation of dialysis  Pain in left arm Known AV fistula thrombosis left arm Can consider vascular consult in the a.m. Pain control     DVT prophylaxis: Lovenox   Consults: none  Advance Care  Planning:   Code Status: Prior   Family Communication: none  Disposition Plan: Back to previous home environment  Severity of Illness: The appropriate patient status for this patient is INPATIENT. Inpatient status is judged to be reasonable and necessary in order to provide the required intensity of service to ensure the patient's safety. The patient's presenting symptoms, physical exam findings, and initial radiographic and laboratory data in the context of their chronic comorbidities is felt to place them at high risk for further clinical deterioration. Furthermore, it is not anticipated that the patient will be medically stable for discharge from the hospital within 2 midnights of admission.   * I certify that at the point of admission it is my clinical judgment that the patient will require inpatient hospital care spanning beyond 2 midnights from the point of admission due to high intensity of service, high risk for further deterioration and high frequency of surveillance required.*  Author: Lanetta Pion, MD 10/06/2023 12:56 AM  For on call review [email or web address].

## 2023-10-06 NOTE — Plan of Care (Signed)
  Problem: Education: Goal: Ability to describe self-care measures that may prevent or decrease complications (Diabetes Survival Skills Education) will improve Outcome: Progressing   Problem: Coping: Goal: Ability to adjust to condition or change in health will improve Outcome: Progressing   Problem: Fluid Volume: Goal: Ability to maintain a balanced intake and output will improve Outcome: Progressing   Problem: Metabolic: Goal: Ability to maintain appropriate glucose levels will improve Outcome: Progressing   Problem: Nutritional: Goal: Maintenance of adequate nutrition will improve Outcome: Not Progressing   Problem: Cardiac: Goal: Ability to maintain an adequate cardiac output will improve Outcome: Progressing

## 2023-10-06 NOTE — Consult Note (Signed)
 Hospital Consult    Reason for Consult:  Non Functioning A/V Fistula Requesting Physician:  Dr Hines Ludwig MD MRN #:  [id number]  History of Present Illness: This is a 33 y.o. male with history of ESRD on dialysis, diabetes presenting today for vomiting. Patient states over the past 24 hours he has had generalized abdominal pain associated with vomiting. He has not been able to eat or drink anything. Feels feverish as well. Otherwise denies cough, congestion, shortness of breath, chest pain. No diarrhea or constipation. Reportedly had black appearing vomit. Denies any black appearing stools though. Also having pain in his left arm which she has had since his initial surgery for fistulas.   Past Medical History:  Diagnosis Date   Adjustment disorder with mixed anxiety and depressed mood    Anemia due to chronic kidney disease    Asthma    Cannabis use disorder    Diabetes mellitus type 2, insulin  dependent (HCC)    DKA (diabetic ketoacidosis) (HCC)    End stage renal disease on dialysis (HCC)    Hyperlipidemia    Hypertension associated with type 2 diabetes mellitus (HCC)    Intractable vomiting with nausea 10/06/2023   Obstructive sleep apnea     Past Surgical History:  Procedure Laterality Date   A/V FISTULAGRAM Left 09/11/2023   Procedure: A/V Fistulagram;  Surgeon: Celso College, MD;  Location: [facility] INVASIVE CV [facility];  Service: Cardiovascular;  Laterality: Left;   AV FISTULA PLACEMENT Left 07/17/2023   Procedure: ARTERIOVENOUS (AV) FISTULA CREATION (RADIALCEPHALIC);  Surgeon: Celso College, MD;  Location: [facility] ORS;  Service: Vascular;  Laterality: Left;   CAPD REMOVAL N/A 07/17/2023   Procedure: CONTINUOUS AMBULATORY PERITONEAL DIALYSIS  (CAPD) CATHETER REMOVAL;  Surgeon: Celso College, MD;  Location: [facility] ORS;  Service: Vascular;  Laterality: N/A;   DIALYSIS/PERMA CATHETER INSERTION  10/2022   PARS PLANA VITRECTOMY W/ ENDOLASER PANRETINAL PHOTOCOAGULATION Right 06/29/2018   PERITONEAL  CATHETER INSERTION  03/04/2023   TONSILLECTOMY AND ADENOIDECTOMY  2002    No Known Allergies  Prior to Admission medications   Medication Sig Start Date End Date Taking? Authorizing Provider  albuterol (VENTOLIN HFA) 108 (90 Base) MCG/ACT inhaler Inhale 2 puffs into the lungs every 6 (six) hours as needed for wheezing or shortness of breath. Patient not taking: Reported on 09/01/2023    [provider]  amLODipine  (NORVASC ) 10 MG tablet Take 10 mg by mouth daily.    [provider]  atorvastatin  (LIPITOR) 20 MG tablet Take 1 tablet by mouth daily. 04/16/22 08/27/24  [provider]  Calcium  Acetate 667 MG TABS Take 1,334 mg by mouth 3 (three) times daily with meals.    [provider]  carvedilol  (COREG ) 25 MG tablet Take 25 mg by mouth 2 (two) times daily. 03/18/22   [provider]  cloNIDine (CATAPRES) 0.1 MG tablet Take 0.1 mg by mouth 2 (two) times daily.    [provider]  Continuous Glucose Receiver (DEXCOM G7 RECEIVER) DEVI by Does not apply route.    [provider]  Continuous Glucose Sensor (DEXCOM G7 SENSOR) MISC by Does not apply route.    [provider]  doxazosin (CARDURA) 4 MG tablet Take 4 mg by mouth 2 (two) times daily.    [provider]  EPINEPHrine 0.3 MG/0.3ML SOSY Inject 0.3 mg into the muscle once.    [provider]  furosemide (LASIX) 40 MG tablet Take 40 mg by mouth 2 (two) times daily.  On Dialysis days    [provider]  gabapentin  (NEURONTIN ) 100 MG capsule Take 100 mg by mouth 3 (three) times a week. With dialysis M-W-F    [provider]  hydrALAZINE  (APRESOLINE ) 100 MG tablet Take 100 mg by mouth 3 (three) times daily.    [provider]  insulin  glargine (LANTUS ) 100 UNIT/ML injection Inject 12 Units into the skin daily with supper.    [provider]  insulin  lispro (HUMALOG) 100 UNIT/ML KwikPen Inject 2-4 Units into the skin 3 (three)  times daily with meals. 03/19/22   [provider]  Insulin  Pen Needle 29G X MISC Please dispense 1 box of 100 insulin  needles and syringes 09/23/19   Bryson Carbine, MD  irbesartan (AVAPRO) 300 MG tablet Take 300 mg by mouth at bedtime.    [provider]  mirtazapine  (REMERON ) 15 MG tablet Take 15 mg by mouth at bedtime.    [provider]  promethazine  (PHENERGAN ) 12.5 MG tablet Take 12.5 mg by mouth every 6 (six) hours as needed for nausea or vomiting.    [provider]  sertraline (ZOLOFT) 50 MG tablet Take 50 mg by mouth daily.    [provider]  sodium bicarbonate 650 MG tablet Take 650 mg by mouth 2 (two) times daily.    [provider]  traMADol  (ULTRAM ) 50 MG tablet Take 1 tablet (50 mg total) by mouth every 6 (six) hours as needed. 07/17/23 07/16/24  Dew, Jason S, MD  traZODone (DESYREL) 50 MG tablet Take 50 mg by mouth at bedtime.    [provider]    Social History   Socioeconomic History   Marital status: Single    Spouse name: Not on file   Number of children: 0   Years of education: Not on file   Highest education level: Not on file  Occupational History   Not on file  Tobacco Use   Smoking status: Former    Current packs/day: 0.00    Types: Cigarettes    Quit date: 05/2023    Years since quitting: 0.3   Smokeless tobacco: Never  Vaping Use   Vaping status: Never Used  Substance and Sexual Activity   Alcohol use: Not Currently   Drug use: Yes    Types: Marijuana    Comment: about a month ago   Sexual activity: Not on file  Other Topics Concern   Not on file  Social History Narrative   Lives with mother   Social Drivers of Health   Financial Resource Strain: Low Risk  (12/19/2022)   Received from [facility]   Overall Financial Resource Strain (CARDIA)    Difficulty of Paying Living Expenses: Not hard at all  Recent Concern: Financial Resource Strain - High Risk (10/22/2022)   Received  from [facility], [facility]   Overall Financial Resource Strain (CARDIA)    Difficulty of Paying Living Expenses: Hard  Food Insecurity: No Food Insecurity (12/19/2022)   Received from [facility]   Hunger Vital Sign    Worried About Running Out of Food in the Last Year: Never true    Ran Out of Food in the Last Year: Never true  Recent Concern: Food Insecurity - Food Insecurity Present (10/22/2022)   Received from [facility], [facility]   Hunger Vital Sign    Worried About Running Out of Food in the Last Year: Sometimes true    Ran Out of  Food in the Last Year: Never true  Transportation Needs: No Transportation Needs (10/06/2023)   PRAPARE - Administrator, Civil Service (Medical): No    Lack of Transportation (Non-Medical): No  Physical Activity: Not on file  Stress: No Stress Concern Present (03/04/2023)   Received from [facility] - Occupational Stress Questionnaire    Feeling of Stress : Not at all  Social Connections: Unknown (02/03/2023)   Received from [facility]   Social Network    Social Network: Not on file  Intimate Partner Violence: Not At Risk (10/06/2023)   Humiliation, Afraid, Rape, and Kick questionnaire    Fear of Current or Ex-Partner: No    Emotionally Abused: No    Physically Abused: No    Sexually Abused: No     Family History  Problem Relation Age of Onset   Healthy Mother     ROS: Otherwise negative unless mentioned in HPI  Physical Examination  Vitals:   10/06/23 1500 10/06/23 1532  BP: (!) 151/73 (!) 145/73  Pulse: 87   Resp: 12   Temp:    SpO2: 100%    Body mass index is 21.12 kg/m.  General: Semi Awake and alert. Very ill appearing. Diaphoretic Gait: Not observed HENT: WNL, normocephalic Pulmonary: labored breathing, without Rales, rhonchi,  wheezing Cardiac: regular, Tachycardia in the 120's, without  Murmurs, rubs or gallops; without carotid  bruits Abdomen: Positive bowel sounds throughout,  soft, NT/ND, no masses Skin: without rashes. Dialysis Access to right Chest Vascular Exam/Pulses: Palpable Pulses throughout. Unable to palpate pulses in left arm fistula. No thrill or bruit noted.  Extremities: without ischemic changes, without Gangrene , without cellulitis; without open wounds;  Musculoskeletal: no muscle wasting or atrophy  Neurologic: A&O X Name only;  No focal weakness or paresthesias are detected; speech is disoriented. Garbled. Psychiatric:  The pt has flat affect. Lymph:  Unremarkable  CBC    Component Value Date/Time   WBC 8.4 10/05/2023 1728   RBC 4.28 10/05/2023 1728   HGB 12.7 (L) 10/05/2023 1728   HCT 38.1 (L) 10/05/2023 1728   PLT 360 10/05/2023 1728   MCV 89.0 10/05/2023 1728   MCH 29.7 10/05/2023 1728   MCHC 33.3 10/05/2023 1728   RDW 14.8 10/05/2023 1728   LYMPHSABS 1.5 07/10/2023 1145   MONOABS 0.6 07/10/2023 1145   EOSABS 0.7 (H) 07/10/2023 1145   BASOSABS 0.1 07/10/2023 1145    BMET    Component Value Date/Time   NA 140 10/06/2023 0832   K 4.1 10/06/2023 0832   CL 100 10/06/2023 0832   CO2 22 10/06/2023 0832   GLUCOSE 137 (H) 10/06/2023 0832   BUN 59 (H) 10/06/2023 0832   CREATININE 11.41 (H) 10/06/2023 0832   CALCIUM  9.2 10/06/2023 0832   GFRNONAA 6 (L) 10/06/2023 0832   GFRAA >60 09/23/2019 0958    COAGS: Lab Results  Component Value Date   INR 1.2 10/05/2023     Non-Invasive Vascular Imaging:   EXAM: LEFT UPPER EXTREMITY VENOUS DOPPLER ULTRASOUND   TECHNIQUE: Gray-scale sonography with graded compression, as well as color Doppler and duplex ultrasound were performed to evaluate the upper extremity deep venous system from the level of the subclavian vein and including the jugular, axillary, basilic, radial, ulnar and upper cephalic vein. Spectral Doppler was utilized to evaluate flow at rest and with distal augmentation maneuvers.   COMPARISON:  Report from an  ultrasound in an outside institution five  days ago, images not available.   FINDINGS: Contralateral Subclavian Vein: Respiratory phasicity is normal and symmetric with the symptomatic side. No evidence of thrombus. Normal compressibility.   Internal Jugular Vein: No evidence of thrombus. Normal compressibility, respiratory phasicity and response to augmentation.   Subclavian Vein: No evidence of thrombus. Normal compressibility, respiratory phasicity and response to augmentation.   Axillary Vein: No evidence of thrombus. Normal compressibility, respiratory phasicity and response to augmentation.   Cephalic Vein: The cephalic AV fistula described on prior exam is not evaluated. Flow is demonstrated in the cephalic vein. No fluid collection is seen on the current exam   Basilic Vein: No evidence of thrombus. Normal compressibility, respiratory phasicity and response to augmentation.   Brachial Veins: No evidence of thrombus. Normal compressibility, respiratory phasicity and response to augmentation.   Radial Veins: No evidence of thrombus. Normal compressibility, respiratory phasicity and response to augmentation.   Ulnar Veins: Fistula at the wrist which appears thrombosed.   Other Findings:  No evidence of focal fluid collection.   IMPRESSION: 1. 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 2. The cephalic AV fistula described on prior outside exam with associated collection is not seen on provided images. Vascular surgery follow-up is recommended. 3. No evidence of left upper extremity DVT.  Statin:  Yes.   Beta Blocker:  Yes.   Aspirin:  No. ACEI:  No. ARB:  Yes.   CCB use:  Yes Other antiplatelets/anticoagulants:  No.    ASSESSMENT/PLAN: This is a 33 y.o. male who presents to [facility] emergency department with abdominal pain, intractable vomiting, DKA and hypertensive crisis.  Patient is a known dialysis patient with a fistula in his left upper extremity.  He was recently seen  at [facility] in the emergency department for AV fistula complications.  At that time he underwent an ultrasound which showed no evidence of obstruction of any central veins.  Ultrasound here on 10/05/2023 shows thrombosed fistula at the wrist.  Patient currently has a right chest dialysis tunneled permacatheter in place for dialysis.  Vascular surgery will plan on taking the patient to the vascular lab for a left upper extremity fistulogram with possible intervention later this week as he recovers from DKA and his hypertensive crisis.   -I discussed the case with Dr. Mikki Alexander MD and he agrees with the plan   Annamaria [facility] 10/06/2023 3:45 PM

## 2023-10-06 NOTE — Assessment & Plan Note (Signed)
Nephrology consult for continuation of dialysis 

## 2023-10-06 NOTE — Inpatient Diabetes Management (Addendum)
 Inpatient Diabetes Program Recommendations  AACE/ADA: New Consensus Statement on Inpatient Glycemic Control (2015)  Target Ranges:  Prepandial:   less than 140 mg/dL      Peak postprandial:   less than 180 mg/dL (1-2 hours)      Critically ill patients:  140 - 180 mg/dL    Latest Reference Range & Units 10/05/23 17:28 10/06/23 01:41 10/06/23 05:03  CO2 22 - 32 mmol/L 13 (L) 19 (L) 19 (L)  Glucose 70 - 99 mg/dL 829 (H) 562 (H) 130 (H)  Anion gap 5 - 15  27 (H) 23 (H) 21 (H)    Latest Reference Range & Units 10/05/23 17:28 10/05/23 22:20 10/06/23 01:41 10/06/23 05:03  Beta-Hydroxybutyric Acid 0.05 - 0.27 mmol/L 3.87 (H) 5.26 (H) 5.23 (H) 2.67 (H)    Latest Reference Range & Units 10/05/23 17:28  Lactic Acid, Venous 0.5 - 1.9 mmol/L 2.4 (HH)   Review of Glycemic Control  Diabetes history: DM2 Outpatient Diabetes medications: Lantus  12 units QPM, Humalog 2-4 units TID with meals; Dexcom G7 CGM Current orders for Inpatient glycemic control: IV insulin   Inpatient Diabetes Program Recommendations:    Insulin : Once provider is ready to transition from IV to SQ insulin , please consider ordering Semglee  10 units Q24H, CBGs Q4H, and Novolog  0-6 units Q4H.  NOTE: Patient presented to ED with nausea, vomiting, and abdominal pain. Patient admitted with ketoacidosis, euglycemic DKA versus starvation ketosis, SIRS, NV, hypertensive urgency, ESRD on HD, and pain in left arm from AV fistula thrombosis.  Patient was started on IV insulin  and currently still on IV insulin . Question if acidosis more likely related to starvation ketosis. Per chart, patient sees [facility] and last seen Dr. Alexia Angelucci on 01/28/23; per office note patient was asked to continue Lantus  to 12 units daily and restart consistent Humalog 4 units TID with meals plus 1 unit per 50 mg/dl above glucose of 865 mg/dl.  Addendum 10/06/23@11 :35-Spoke with patient and mother at bedside. Patient sleeping and nurse reports that she recently  gave patient Ativan  so he could rest. Patient's mother reports that patient takes Lantus  12 units daily and Humalog 2-4 units TID with meals. She reports that patient has had N/V x4 days and he did not take any insulin  during that time since he was not eating. Discussed sick day guidelines of taking insulin  but perhaps needing insulin  dose adjustments during times of not eating or drinking. Explained that patient's endocrinologist needs to give patient guidelines to follow for times when he is not able to eat or drink. Discussed how sickness and stress response can impact glucose. Discussed DKA versus possible starvation ketosis and treatment with IV insulin . Inquired about glucose monitoring at home and patient had been using a CGM sensor but he has not had it for past 2 months due to an issue with the insurance. She is not sure if he was using Dexcom G7 or FreeStyle Libre 3. Will ask outpatient TOC to see which sensor is preferred with insurance and see if we can check and see if there is an insurance issue with getting the CGM sensors.  Patient's mother confirms that patient last seen Endocrinologist in September 2024 and he does not have any appointments coming up. Asked that patient's mother to be sure her or patient call Endocrinology office to get an appointment for follow up in the near future. Patient's mother verbalized understanding of information discussed and has no questions at this time.  Thanks, Beacher Limerick, RN, MSN, CDCES Diabetes Coordinator  Inpatient Diabetes Program [phone] (Team Pager from 8am to 5pm)

## 2023-10-06 NOTE — Assessment & Plan Note (Addendum)
 Intractable nausea and vomiting ?Gastroparesis CT abdomen and pelvis nonacute, lipase and LFTs unremarkable IV Protonix , IV antiemetics Received 2 L bolus in the ED

## 2023-10-06 NOTE — Progress Notes (Signed)
 [facility]  ROUNDING NOTE   Subjective:   Darrell Walls is a 33 y.o. male with past medical history of hypertension, sleep apnea on CPAP, anemia, and end-stage renal disease on hemodialysis.  Patient presents to the emergency department complaining of abdominal pain and vomiting and has been admitted for Ketoacidosis [E87.29] High anion gap metabolic acidosis [E87.29] Nausea and vomiting, unspecified vomiting type [R11.2]  Patient is known our practice and receives outpatient dialysis treatments at [facility] on a MWF schedule, supervised by Dr. Zelda Hickman.  Last treatment received on Friday.  Patient seen and evaluated at bedside in ICU.  Recently received antiemetics and is drowsy.  No family present at bedside.  Currently infusing insulin  drip.  No lower extremity edema.  Room air.  Labs on ED arrival significant for serum bicarb 13, glucose 134, BUN 48, creatinine 9.99 with GFR 7, anion gap 27, lactic acid 2.4, and hemoglobin 12.7.  Respiratory panel negative for influenza, COVID-19, and RSV.  Chest x-ray negative for acute findings.  CT abdomen pelvis show a small anterior pericardial effusion with some aortic arthrosclerosis.  We have been consulted to maintain dialysis needs during this admission.   Objective:  Vital signs in last 24 hours:  Temp:  [97.9 F (36.6 C)-98.7 F (37.1 C)] 98.6 F (37 C) (05/12 0830) Pulse Rate:  [87-122] 89 (05/12 1617) Resp:  [0-27] 11 (05/12 1617) BP: (136-212)/(65-122) 137/80 (05/12 1600) SpO2:  [96 %-100 %] 99 % (05/12 1617) Weight:  [68.7 kg] 68.7 kg (05/12 0227)  Weight change:  Filed Weights   10/06/23 0227  Weight: 68.7 kg    Intake/Output: I/O last 3 completed shifts: In: 3833.7 [I.V.:290.5; IV Piggyback:3543.3] Out: 100 [Emesis/NG output:100]   Intake/Output this shift:  No intake/output data recorded.  Physical Exam: General: NAD  Head: Normocephalic, atraumatic. Moist oral mucosal membranes  Eyes:  Anicteric  Lungs:  Clear to auscultation,normal effort  Heart: Regular rate and rhythm  Abdomen:  Soft, nontender  Extremities:  No peripheral edema.  Neurologic: Somnolent  Skin: No lesions  Access: Rt permcath    Basic Metabolic Panel: Recent Labs  Lab 10/05/23 1728 10/06/23 0141 10/06/23 0503 10/06/23 0832  NA 139 142 142 140  K 4.5 4.8 4.0 4.1  CL 99 100 102 100  CO2 13* 19* 19* 22  GLUCOSE 134* 164* 117* 137*  BUN 48* 57* 58* 59*  CREATININE 9.99* 11.15* 10.90* 11.41*  CALCIUM  9.8 9.3 9.0 9.2    Liver Function Tests: Recent Labs  Lab 10/05/23 1728  AST 24  ALT 21  ALKPHOS 132*  BILITOT 1.7*  PROT 9.4*  ALBUMIN 4.8   Recent Labs  Lab 10/05/23 1728  LIPASE 24   No results for input(s): "AMMONIA" in the last 168 hours.  CBC: Recent Labs  Lab 10/05/23 1728  WBC 8.4  HGB 12.7*  HCT 38.1*  MCV 89.0  PLT 360    Cardiac Enzymes: No results for input(s): "CKTOTAL", "CKMB", "CKMBINDEX", "TROPONINI" in the last 168 hours.  BNP: Invalid input(s): "POCBNP"  CBG: Recent Labs  Lab 10/06/23 1230 10/06/23 1331 10/06/23 1434 10/06/23 1531 10/06/23 1631  GLUCAP 132* 118* 137* 176* 175*    Microbiology: Results for orders placed or performed during the hospital encounter of 10/05/23  Culture, blood (Routine x 2)     Status: None (Preliminary result)   Collection Time: 10/05/23  5:28 PM   Specimen: BLOOD RIGHT ARM  Result Value Ref Range Status   Specimen Description BLOOD  RIGHT ARM  Final   Special Requests   Final    BOTTLES DRAWN AEROBIC AND ANAEROBIC Blood Culture adequate volume   Culture   Final    NO GROWTH < 12 HOURS Performed at [facility], [address] [place], [place] [address]    Report Status PENDING  Incomplete  Resp panel by RT-PCR (RSV, Flu A&B, Covid) Anterior Nasal Swab     Status: None   Collection Time: 10/05/23  8:38 PM   Specimen: Anterior Nasal Swab  Result Value Ref Range Status   SARS Coronavirus 2 by  RT PCR NEGATIVE NEGATIVE Final    Comment: (NOTE) SARS-CoV-2 target nucleic acids are NOT DETECTED.  The SARS-CoV-2 RNA is generally detectable in upper respiratory specimens during the acute phase of infection. The lowest concentration of SARS-CoV-2 viral copies this assay can detect is 138 copies/mL. A negative result does not preclude SARS-Cov-2 infection and should not be used as the sole basis for treatment or other patient management decisions. A negative result may occur with  improper specimen collection/handling, submission of specimen other than nasopharyngeal swab, presence of viral mutation(s) within the areas targeted by this assay, and inadequate number of viral copies(<138 copies/mL). A negative result must be combined with clinical observations, patient history, and epidemiological information. The expected result is Negative.  Fact Sheet for Patients:  BloggerCourse.com  Fact Sheet for Healthcare Providers:  SeriousBroker.it  This test is no t yet approved or cleared by the [place]  FDA and  has been authorized for detection and/or diagnosis of SARS-CoV-2 by FDA under an Emergency Use Authorization (EUA). This EUA will remain  in effect (meaning this test can be used) for the duration of the COVID-19 declaration under Section 564(b)(1) of the Act, 21 U.S.C.section 360bbb-3(b)(1), unless the authorization is terminated  or revoked sooner.       Influenza A by PCR NEGATIVE NEGATIVE Final   Influenza B by PCR NEGATIVE NEGATIVE Final    Comment: (NOTE) The Xpert Xpress SARS-CoV-2/FLU/RSV plus assay is intended as an aid in the diagnosis of influenza from Nasopharyngeal swab specimens and should not be used as a sole basis for treatment. Nasal washings and aspirates are unacceptable for Xpert Xpress SARS-CoV-2/FLU/RSV testing.  Fact Sheet for Patients: BloggerCourse.com  Fact Sheet  for Healthcare Providers: SeriousBroker.it  This test is not yet approved or cleared by the [place]  FDA and has been authorized for detection and/or diagnosis of SARS-CoV-2 by FDA under an Emergency Use Authorization (EUA). This EUA will remain in effect (meaning this test can be used) for the duration of the COVID-19 declaration under Section 564(b)(1) of the Act, 21 U.S.C. section 360bbb-3(b)(1), unless the authorization is terminated or revoked.     Resp Syncytial Virus by PCR NEGATIVE NEGATIVE Final    Comment: (NOTE) Fact Sheet for Patients: BloggerCourse.com  Fact Sheet for Healthcare Providers: SeriousBroker.it  This test is not yet approved or cleared by the [place]  FDA and has been authorized for detection and/or diagnosis of SARS-CoV-2 by FDA under an Emergency Use Authorization (EUA). This EUA will remain in effect (meaning this test can be used) for the duration of the COVID-19 declaration under Section 564(b)(1) of the Act, 21 U.S.C. section 360bbb-3(b)(1), unless the authorization is terminated or revoked.  Performed at [facility], [address] [place], [place] [address]   MRSA Next Gen by PCR, Nasal     Status: None   Collection Time: 10/06/23  1:41 AM  Specimen: Nasal Mucosa; Nasal Swab  Result Value Ref Range Status   MRSA by PCR Next Gen NOT DETECTED NOT DETECTED Final    Comment: (NOTE) The GeneXpert MRSA Assay (FDA approved for NASAL specimens only), is one component of a comprehensive MRSA colonization surveillance program. It is not intended to diagnose MRSA infection nor to guide or monitor treatment for MRSA infections. Test performance is not FDA approved in patients less than 21 years old. Performed at [facility], [address] [place], [place] [address]     Coagulation Studies: Recent Labs    10/05/23 1728  LABPROT 15.8*   INR 1.2    Urinalysis: No results for input(s): "COLORURINE", "LABSPEC", "PHURINE", "GLUCOSEU", "HGBUR", "BILIRUBINUR", "KETONESUR", "PROTEINUR", "UROBILINOGEN", "NITRITE", "LEUKOCYTESUR" in the last 72 hours.  Invalid input(s): "APPERANCEUR"    Imaging: US  Venous Img Upper Uni Left Result Date: 10/05/2023 CLINICAL DATA:  left upper extremity pain and swelling, multiple prior fistula surgeries with stenosis, reportedly recent abscess in extremity, evaluting for clot vs cellulitis vs abscess EXAM: LEFT UPPER EXTREMITY VENOUS DOPPLER ULTRASOUND TECHNIQUE: Gray-scale sonography with graded compression, as well as color Doppler and duplex ultrasound were performed to evaluate the upper extremity deep venous system from the level of the subclavian vein and including the jugular, axillary, basilic, radial, ulnar and upper cephalic vein. Spectral Doppler was utilized to evaluate flow at rest and with distal augmentation maneuvers. COMPARISON:  Report from an ultrasound in an outside institution five days ago, images not available. FINDINGS: Contralateral Subclavian Vein: Respiratory phasicity is normal and symmetric with the symptomatic side. No evidence of thrombus. Normal compressibility. Internal Jugular Vein: No evidence of thrombus. Normal compressibility, respiratory phasicity and response to augmentation. Subclavian Vein: No evidence of thrombus. Normal compressibility, respiratory phasicity and response to augmentation. Axillary Vein: No evidence of thrombus. Normal compressibility, respiratory phasicity and response to augmentation. Cephalic Vein: The cephalic AV fistula described on prior exam is not evaluated. Flow is demonstrated in the cephalic vein. No fluid collection is seen on the current exam Basilic Vein: No evidence of thrombus. Normal compressibility, respiratory phasicity and response to augmentation. Brachial Veins: No evidence of thrombus. Normal compressibility, respiratory phasicity  and response to augmentation. Radial Veins: No evidence of thrombus. Normal compressibility, respiratory phasicity and response to augmentation. Ulnar Veins: Fistula at the wrist which appears thrombosed. Other Findings:  No evidence of focal fluid collection. IMPRESSION: 1. 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 2. The cephalic AV fistula described on prior outside exam with associated collection is not seen on provided images. Vascular surgery follow-up is recommended. 3. No evidence of left upper extremity DVT. Electronically Signed   By: Chadwick Colonel M.D.   On: 10/05/2023 21:39   CT ABDOMEN PELVIS WO CONTRAST Result Date: 10/05/2023 CLINICAL DATA:  Black emesis. EXAM: CT ABDOMEN AND PELVIS WITHOUT CONTRAST TECHNIQUE: Multidetector CT imaging of the abdomen and pelvis was performed following the standard protocol without IV contrast. RADIATION DOSE REDUCTION: This exam was performed according to the departmental dose-optimization program which includes automated exposure control, adjustment of the mA and/or kV according to patient size and/or use of iterative reconstruction technique. COMPARISON:  May 15, 2022 FINDINGS: Lower chest: A 12 mm thick anterior pericardial effusion is noted. Hepatobiliary: No focal liver abnormality is seen. No gallstones, gallbladder wall thickening, or biliary dilatation. Pancreas: Unremarkable. No pancreatic ductal dilatation or surrounding inflammatory changes. Spleen: Normal in size without focal abnormality. Adrenals/Urinary Tract: Adrenal glands are unremarkable. Kidneys are normal, without renal calculi, focal  lesion, or hydronephrosis. Urinary bladder is poorly distended. Mild diffuse urinary bladder wall thickening is seen. Stomach/Bowel: Stomach is within normal limits. Appendix appears normal. Stool is seen throughout the large bowel. No evidence of bowel wall thickening, distention, or inflammatory changes. Vascular/Lymphatic: Aortic atherosclerosis with diffuse  calcification of the arterial structures within the abdomen and pelvis. No enlarged abdominal or pelvic lymph nodes. Reproductive: Prostate is unremarkable. Other: No abdominal wall hernia or abnormality. No abdominopelvic ascites. Musculoskeletal: No acute or significant osseous findings. IMPRESSION: 1. Small anterior pericardial effusion. 2. Aortic atherosclerosis. Electronically Signed   By: Virgle Grime M.D.   On: 10/05/2023 20:34   DG Chest Port 1 View Result Date: 10/05/2023 CLINICAL DATA:  Suspected sepsis.  Vomiting. EXAM: PORTABLE CHEST 1 VIEW COMPARISON:  Radiograph 04/27/2019 FINDINGS: Right-sided dialysis catheter tip in the right atrium.The cardiomediastinal contours are normal. The lungs are clear. Pulmonary vasculature is normal. No consolidation, pleural effusion, or pneumothorax. No acute osseous abnormalities are seen. IMPRESSION: No active disease. Electronically Signed   By: Chadwick Colonel M.D.   On: 10/05/2023 19:43     Medications:    dextrose  50 mL/hr at 10/06/23 0700   insulin  1.9 Units/hr (10/06/23 1537)   promethazine  (PHENERGAN ) injection (IM or IVPB) Stopped (10/06/23 0408)    amLODipine   10 mg Oral Daily   carvedilol   25 mg Oral BID   Chlorhexidine  Gluconate Cloth  6 each Topical Daily   cloNIDine  0.2 mg Transdermal Weekly   heparin   5,000 Units Subcutaneous Q8H   hydrALAZINE   100 mg Oral TID   irbesartan  300 mg Oral QHS   mirtazapine   15 mg Oral QHS   pantoprazole  (PROTONIX ) IV  40 mg Intravenous Q0600   sertraline  50 mg Oral Daily   acetaminophen  **OR** acetaminophen , dextrose , labetalol, morphine  injection, ondansetron  **OR** ondansetron  (ZOFRAN ) IV, promethazine  (PHENERGAN ) injection (IM or IVPB)  Assessment/ Plan:  Mr. Kosuke Call is a 33 y.o.  male with past medical history of hypertension, sleep apnea on CPAP, anemia, and end-stage renal disease on hemodialysis.  Patient presents to the emergency department complaining of abdominal pain and  vomiting and has been admitted for Ketoacidosis [E87.29] High anion gap metabolic acidosis [E87.29] Nausea and vomiting, unspecified vomiting type [R11.2]  [facility]/MWF/Rt permcath  End-stage renal disease on hemodialysis.  Last treatment completed on Friday.  Patient scheduled to receive treatment later today.  Next treatment scheduled for Wednesday.  2. Diabetes mellitus type II with chronic kidney disease/renal manifestations: DKA versus ketosis.  Insulin  dependent. Home regimen includes Lantus  and lispro. Most recent hemoglobin A1c is 8.8 in 2023. No outpatient SLGT-2 inhibitors.  Currently prescribed insulin  drip with D10 infusion.  3.  Hypertensive urgency.  Home regimen includes furosemide, amlodipine , carvedilol , clonidine, doxazosin, hydralazine , and irbesartan.  Doxazosin currently held  4. Anemia of chronic kidney disease/ kidney injury/chronic disease/acute blood loss:  Lab Results  Component Value Date   HGB 12.7 (L) 10/05/2023    Hemoglobin within optimal range.  No need for ESA's at this time.   LOS: 1 Francois Elk 5/12/20254:42 PM

## 2023-10-06 NOTE — Progress Notes (Addendum)
 0800 Remains hypertensive,nauseated and dry heaving. Dr Sari Cunning notified and orders received. 2130 Given ordered Labetolol.  8657 Given Ativan  1 mg as ordered. 0904 Catapres patch placed on left upper arm. 0915 Patient resting quietly in bed. 0930 Sleeping calmly. No vomiting at this time. 1030 Hourly CBGs continued and Insulin  drip adjusted per Endo Tool. 1100 Given oral medications for blood pressure. 1137 Given prn for sustained blood pressure greater than 160 systolic. 1736 Given prn Labetalol for SBP greater than 160.

## 2023-10-06 NOTE — Progress Notes (Addendum)
 PROGRESS NOTE    Darrell Walls  UJW:[id number] DOB: 01/17/1991 DOA: 10/05/2023 PCP: Theda Finical, MD  Outpatient Specialists: vascular surgery, endocrinology, nephrology    Brief Narrative:   From admission h and p  Darrell Walls is a 33 y.o. male with medical history significant for ESRD on HD MWF, HTN, OSA on CPAP, anemia of CKD, presenting with abdominal pain and intractable vomiting.  He denied diarrhea, fever, chest pain or shortness of breath.  Presented similarly back in 2023 with diagnosis of DKA with UTI.  Separately, patient complains of pain in his left arm, at the site of his AV fistula for which he was seen on 5/6 at the ED at [facility] that showed no evidence of obstruction in the central veins.   Assessment & Plan:   Principal Problem:   Ketoacidosis Active Problems:   SIRS (systemic inflammatory response syndrome) (HCC)   Abdominal pain   Intractable vomiting with nausea   Hypertensive urgency   ESRD on hemodialysis (HCC)   Pain in left arm   AV fistula thrombosis, subsequent encounter   Type 2 diabetes mellitus without complication (HCC)  # DKA # T2DM With ongoing nausea/vomiting. No infectious source yet identified. Remains in DKA with ongoing nausea/vomiting. CXR clear, CT abdomen/pelvis nothing acute. - continue insulin  drip - continue reduced rate fluids (2/2 esrd, anuria) - continue anti-emetics - f/u A1c - monitor cultures, no ua as anuric  # Hypertensive urgency # HTN BPs remain elevated. Home meds have not been re-started - resume home amlodipine , coreg , clonidine, doxazosin, hydralazine , irbesartan - likely dialysis today  # MDD - home sertraline, mirtazapine    DVT prophylaxis: lovenox  Code Status: full Family Communication: none at bedside, no answer when mother telephoned  Level of care: Stepdown Status is: Inpatient Remains inpatient appropriate because: severity of illness    Consultants:  Vascular, nephrology  Procedures: None  thus far  Antimicrobials:  Received a dose of vancomycin and cefepime in the ER    Subjective: Ongoing nausea, denies abdominal or other pain. No diarrhea, no cough  Objective: Vitals:   10/06/23 0600 10/06/23 0630 10/06/23 0645 10/06/23 0700  BP: (!) 203/83 (!) 183/82 (!) 194/92 (!) 181/93  Pulse: (!) 107 (!) 122 (!) 104 (!) 107  Resp: 13 (!) 23 (!) 0 10  Temp:      TempSrc:      SpO2: 96% 100% 98% 100%  Weight:      Height:        Intake/Output Summary (Last 24 hours) at 10/06/2023 0830 Last data filed at 10/06/2023 0700 Gross per 24 hour  Intake 3833.73 ml  Output 100 ml  Net 3733.73 ml   Filed Weights   10/06/23 0227  Weight: 68.7 kg    Examination:  General exam: Appears ill, diaphoretic Respiratory system: Clear to auscultation save for faint rales at bases Cardiovascular system: S1 & S2 heard, RRR.   Gastrointestinal system: Abdomen is nondistended, soft and nontender.   Central nervous system: Alert and oriented. Moving all 4 Extremities: Symmetric 5 x 5 power. Fistula left arm Skin: No rashes, lesions or ulcers. Right subclavian vascular catheter Psychiatry: Judgement and insight appear normal. Mood & affect appropriate.     Data Reviewed: I have personally reviewed following labs and imaging studies  CBC: Recent Labs  Lab 10/05/23 1728  WBC 8.4  HGB 12.7*  HCT 38.1*  MCV 89.0  PLT 360   Basic Metabolic Panel: Recent Labs  Lab 10/05/23 1728 10/06/23 0141 10/06/23 0503  NA 139 142 142  K 4.5 4.8 4.0  CL 99 100 102  CO2 13* 19* 19*  GLUCOSE 134* 164* 117*  BUN 48* 57* 58*  CREATININE 9.99* 11.15* 10.90*  CALCIUM  9.8 9.3 9.0   GFR: Estimated Creatinine Clearance: 9.5 mL/min (A) (by C-G formula based on SCr of 10.9 mg/dL (H)). Liver Function Tests: Recent Labs  Lab 10/05/23 1728  AST 24  ALT 21  ALKPHOS 132*  BILITOT 1.7*  PROT 9.4*  ALBUMIN 4.8   Recent Labs  Lab 10/05/23 1728  LIPASE 24   No results for input(s):  "AMMONIA" in the last 168 hours. Coagulation Profile: Recent Labs  Lab 10/05/23 1728  INR 1.2   Cardiac Enzymes: No results for input(s): "CKTOTAL", "CKMB", "CKMBINDEX", "TROPONINI" in the last 168 hours. BNP (last 3 results) No results for input(s): "PROBNP" in the last 8760 hours. HbA1C: No results for input(s): "HGBA1C" in the last 72 hours. CBG: Recent Labs  Lab 10/06/23 0343 10/06/23 0430 10/06/23 0549 10/06/23 0636 10/06/23 0729  GLUCAP 101* 102* 115* 133* 135*   Lipid Profile: No results for input(s): "CHOL", "HDL", "LDLCALC", "TRIG", "CHOLHDL", "LDLDIRECT" in the last 72 hours. Thyroid Function Tests: No results for input(s): "TSH", "T4TOTAL", "FREET4", "T3FREE", "THYROIDAB" in the last 72 hours. Anemia Panel: No results for input(s): "VITAMINB12", "FOLATE", "FERRITIN", "TIBC", "IRON", "RETICCTPCT" in the last 72 hours. Urine analysis:    Component Value Date/Time   COLORURINE YELLOW (A) 05/16/2022 0410   APPEARANCEUR HAZY (A) 05/16/2022 0410   LABSPEC 1.012 05/16/2022 0410   PHURINE 5.0 05/16/2022 0410   GLUCOSEU >=500 (A) 05/16/2022 0410   HGBUR SMALL (A) 05/16/2022 0410   BILIRUBINUR NEGATIVE 05/16/2022 0410   KETONESUR NEGATIVE 05/16/2022 0410   PROTEINUR >=300 (A) 05/16/2022 0410   NITRITE NEGATIVE 05/16/2022 0410   LEUKOCYTESUR NEGATIVE 05/16/2022 0410   Sepsis Labs: @LABRCNTIP (procalcitonin:4,lacticidven:4)  ) Recent Results (from the past 240 hours)  Culture, blood (Routine x 2)     Status: None (Preliminary result)   Collection Time: 10/05/23  5:28 PM   Specimen: BLOOD RIGHT ARM  Result Value Ref Range Status   Specimen Description BLOOD RIGHT ARM  Final   Special Requests   Final    BOTTLES DRAWN AEROBIC AND ANAEROBIC Blood Culture adequate volume   Culture   Final    NO GROWTH < 12 HOURS Performed at [facility], [address], [place], [place] [address]    Report Status PENDING  Incomplete  Resp panel by RT-PCR (RSV, Flu  A&B, Covid) Anterior Nasal Swab     Status: None   Collection Time: 10/05/23  8:38 PM   Specimen: Anterior Nasal Swab  Result Value Ref Range Status   SARS Coronavirus 2 by RT PCR NEGATIVE NEGATIVE Final    Comment: (NOTE) SARS-CoV-2 target nucleic acids are NOT DETECTED.  The SARS-CoV-2 RNA is generally detectable in upper respiratory specimens during the acute phase of infection. The lowest concentration of SARS-CoV-2 viral copies this assay can detect is 138 copies/mL. A negative result does not preclude SARS-Cov-2 infection and should not be used as the sole basis for treatment or other patient management decisions. A negative result may occur with  improper specimen collection/handling, submission of specimen other than nasopharyngeal swab, presence of viral mutation(s) within the areas targeted by this assay, and inadequate number of viral copies(<138 copies/mL). A negative result must be combined with clinical observations, patient history, and epidemiological information. The expected result is Negative.  Fact Sheet  for Patients:  BloggerCourse.com  Fact Sheet for Healthcare Providers:  SeriousBroker.it  This test is no t yet approved or cleared by the [place]  FDA and  has been authorized for detection and/or diagnosis of SARS-CoV-2 by FDA under an Emergency Use Authorization (EUA). This EUA will remain  in effect (meaning this test can be used) for the duration of the COVID-19 declaration under Section 564(b)(1) of the Act, 21 U.S.C.section 360bbb-3(b)(1), unless the authorization is terminated  or revoked sooner.       Influenza A by PCR NEGATIVE NEGATIVE Final   Influenza B by PCR NEGATIVE NEGATIVE Final    Comment: (NOTE) The Xpert Xpress SARS-CoV-2/FLU/RSV plus assay is intended as an aid in the diagnosis of influenza from Nasopharyngeal swab specimens and should not be used as a sole basis for treatment.  Nasal washings and aspirates are unacceptable for Xpert Xpress SARS-CoV-2/FLU/RSV testing.  Fact Sheet for Patients: BloggerCourse.com  Fact Sheet for Healthcare Providers: SeriousBroker.it  This test is not yet approved or cleared by the [place]  FDA and has been authorized for detection and/or diagnosis of SARS-CoV-2 by FDA under an Emergency Use Authorization (EUA). This EUA will remain in effect (meaning this test can be used) for the duration of the COVID-19 declaration under Section 564(b)(1) of the Act, 21 U.S.C. section 360bbb-3(b)(1), unless the authorization is terminated or revoked.     Resp Syncytial Virus by PCR NEGATIVE NEGATIVE Final    Comment: (NOTE) Fact Sheet for Patients: BloggerCourse.com  Fact Sheet for Healthcare Providers: SeriousBroker.it  This test is not yet approved or cleared by the [place]  FDA and has been authorized for detection and/or diagnosis of SARS-CoV-2 by FDA under an Emergency Use Authorization (EUA). This EUA will remain in effect (meaning this test can be used) for the duration of the COVID-19 declaration under Section 564(b)(1) of the Act, 21 U.S.C. section 360bbb-3(b)(1), unless the authorization is terminated or revoked.  Performed at [facility], [address] [place], [place] [address]   MRSA Next Gen by PCR, Nasal     Status: None   Collection Time: 10/06/23  1:41 AM   Specimen: Nasal Mucosa; Nasal Swab  Result Value Ref Range Status   MRSA by PCR Next Gen NOT DETECTED NOT DETECTED Final    Comment: (NOTE) The GeneXpert MRSA Assay (FDA approved for NASAL specimens only), is one component of a comprehensive MRSA colonization surveillance program. It is not intended to diagnose MRSA infection nor to guide or monitor treatment for MRSA infections. Test performance is not FDA approved in patients less than  30 years old. Performed at [facility], [address] [place], [place] [address]          Radiology Studies: US  Venous Img Upper Uni Left Result Date: 10/05/2023 CLINICAL DATA:  left upper extremity pain and swelling, multiple prior fistula surgeries with stenosis, reportedly recent abscess in extremity, evaluting for clot vs cellulitis vs abscess EXAM: LEFT UPPER EXTREMITY VENOUS DOPPLER ULTRASOUND TECHNIQUE: Gray-scale sonography with graded compression, as well as color Doppler and duplex ultrasound were performed to evaluate the upper extremity deep venous system from the level of the subclavian vein and including the jugular, axillary, basilic, radial, ulnar and upper cephalic vein. Spectral Doppler was utilized to evaluate flow at rest and with distal augmentation maneuvers. COMPARISON:  Report from an ultrasound in an outside institution five days ago, images not available. FINDINGS: Contralateral Subclavian Vein: Respiratory phasicity is normal and symmetric with the symptomatic  side. No evidence of thrombus. Normal compressibility. Internal Jugular Vein: No evidence of thrombus. Normal compressibility, respiratory phasicity and response to augmentation. Subclavian Vein: No evidence of thrombus. Normal compressibility, respiratory phasicity and response to augmentation. Axillary Vein: No evidence of thrombus. Normal compressibility, respiratory phasicity and response to augmentation. Cephalic Vein: The cephalic AV fistula described on prior exam is not evaluated. Flow is demonstrated in the cephalic vein. No fluid collection is seen on the current exam Basilic Vein: No evidence of thrombus. Normal compressibility, respiratory phasicity and response to augmentation. Brachial Veins: No evidence of thrombus. Normal compressibility, respiratory phasicity and response to augmentation. Radial Veins: No evidence of thrombus. Normal compressibility, respiratory phasicity and response to  augmentation. Ulnar Veins: Fistula at the wrist which appears thrombosed. Other Findings:  No evidence of focal fluid collection. IMPRESSION: 1. 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 2. The cephalic AV fistula described on prior outside exam with associated collection is not seen on provided images. Vascular surgery follow-up is recommended. 3. No evidence of left upper extremity DVT. Electronically Signed   By: Chadwick Colonel M.D.   On: 10/05/2023 21:39   CT ABDOMEN PELVIS WO CONTRAST Result Date: 10/05/2023 CLINICAL DATA:  Black emesis. EXAM: CT ABDOMEN AND PELVIS WITHOUT CONTRAST TECHNIQUE: Multidetector CT imaging of the abdomen and pelvis was performed following the standard protocol without IV contrast. RADIATION DOSE REDUCTION: This exam was performed according to the departmental dose-optimization program which includes automated exposure control, adjustment of the mA and/or kV according to patient size and/or use of iterative reconstruction technique. COMPARISON:  May 15, 2022 FINDINGS: Lower chest: A 12 mm thick anterior pericardial effusion is noted. Hepatobiliary: No focal liver abnormality is seen. No gallstones, gallbladder wall thickening, or biliary dilatation. Pancreas: Unremarkable. No pancreatic ductal dilatation or surrounding inflammatory changes. Spleen: Normal in size without focal abnormality. Adrenals/Urinary Tract: Adrenal glands are unremarkable. Kidneys are normal, without renal calculi, focal lesion, or hydronephrosis. Urinary bladder is poorly distended. Mild diffuse urinary bladder wall thickening is seen. Stomach/Bowel: Stomach is within normal limits. Appendix appears normal. Stool is seen throughout the large bowel. No evidence of bowel wall thickening, distention, or inflammatory changes. Vascular/Lymphatic: Aortic atherosclerosis with diffuse calcification of the arterial structures within the abdomen and pelvis. No enlarged abdominal or pelvic lymph nodes. Reproductive:  Prostate is unremarkable. Other: No abdominal wall hernia or abnormality. No abdominopelvic ascites. Musculoskeletal: No acute or significant osseous findings. IMPRESSION: 1. Small anterior pericardial effusion. 2. Aortic atherosclerosis. Electronically Signed   By: Virgle Grime M.D.   On: 10/05/2023 20:34   DG Chest Port 1 View Result Date: 10/05/2023 CLINICAL DATA:  Suspected sepsis.  Vomiting. EXAM: PORTABLE CHEST 1 VIEW COMPARISON:  Radiograph 04/27/2019 FINDINGS: Right-sided dialysis catheter tip in the right atrium.The cardiomediastinal contours are normal. The lungs are clear. Pulmonary vasculature is normal. No consolidation, pleural effusion, or pneumothorax. No acute osseous abnormalities are seen. IMPRESSION: No active disease. Electronically Signed   By: Chadwick Colonel M.D.   On: 10/05/2023 19:43        Scheduled Meds:  Chlorhexidine  Gluconate Cloth  6 each Topical Daily   heparin   5,000 Units Subcutaneous Q8H   metoCLOPramide  (REGLAN ) injection  10 mg Intravenous Q6H   pantoprazole  (PROTONIX ) IV  40 mg Intravenous Q0600   Continuous Infusions:  dextrose  50 mL/hr at 10/06/23 0700   insulin  1.4 Units/hr (10/06/23 0736)   promethazine  (PHENERGAN ) injection (IM or IVPB) Stopped (10/06/23 0408)     LOS: 1 day   CRITICAL  CARE Performed by: Raymonde Calico   Total critical care time: 49 minutes  Critical care time was exclusive of separately billable procedures and treating other patients.  Critical care was necessary to treat or prevent imminent or life-threatening deterioration.  Critical care was time spent personally by me on the following activities: development of treatment plan with patient and/or surrogate as well as nursing, discussions with consultants, evaluation of patient's response to treatment, examination of patient, obtaining history from patient or surrogate, ordering and performing treatments and interventions, ordering and review of laboratory studies,  ordering and review of radiographic studies, pulse oximetry and re-evaluation of patient's condition.   Raymonde Calico, MD [facility]   If 7PM-7AM, please contact night-coverage [email or web address] Password [email or web address] 10/06/2023, 8:30 AM

## 2023-10-07 ENCOUNTER — Ambulatory Visit (INDEPENDENT_AMBULATORY_CARE_PROVIDER_SITE_OTHER): Admitting: Nurse Practitioner

## 2023-10-07 ENCOUNTER — Encounter (INDEPENDENT_AMBULATORY_CARE_PROVIDER_SITE_OTHER)

## 2023-10-07 DIAGNOSIS — E8729 Other acidosis: Secondary | ICD-10-CM | POA: Diagnosis not present

## 2023-10-07 LAB — GLUCOSE, CAPILLARY
Glucose-Capillary: 113 mg/dL — ABNORMAL HIGH (ref 70–99)
Glucose-Capillary: 121 mg/dL — ABNORMAL HIGH (ref 70–99)
Glucose-Capillary: 115 mg/dL — ABNORMAL HIGH (ref 70–99)
Glucose-Capillary: 117 mg/dL — ABNORMAL HIGH (ref 70–99)
Glucose-Capillary: 135 mg/dL — ABNORMAL HIGH (ref 70–99)
Glucose-Capillary: 125 mg/dL — ABNORMAL HIGH (ref 70–99)
Glucose-Capillary: 133 mg/dL — ABNORMAL HIGH (ref 70–99)
Glucose-Capillary: 145 mg/dL — ABNORMAL HIGH (ref 70–99)
Glucose-Capillary: 153 mg/dL — ABNORMAL HIGH (ref 70–99)
Glucose-Capillary: 116 mg/dL — ABNORMAL HIGH (ref 70–99)
Glucose-Capillary: 135 mg/dL — ABNORMAL HIGH (ref 70–99)
Glucose-Capillary: 140 mg/dL — ABNORMAL HIGH (ref 70–99)
Glucose-Capillary: 125 mg/dL — ABNORMAL HIGH (ref 70–99)
Glucose-Capillary: 133 mg/dL — ABNORMAL HIGH (ref 70–99)

## 2023-10-07 LAB — BASIC METABOLIC PANEL WITH GFR
Anion gap: 18 — ABNORMAL HIGH (ref 5–15)
BUN: 61 mg/dL — ABNORMAL HIGH (ref 6–20)
CO2: 23 mmol/L (ref 22–32)
Calcium: 9.1 mg/dL (ref 8.9–10.3)
Chloride: 97 mmol/L — ABNORMAL LOW (ref 98–111)
Creatinine, Ser: 11.83 mg/dL — ABNORMAL HIGH (ref 0.61–1.24)
GFR, Estimated: 5 mL/min — ABNORMAL LOW (ref >60)
Glucose, Bld: 128 mg/dL — ABNORMAL HIGH (ref 70–99)
Potassium: 3.5 mmol/L (ref 3.5–5.1)
Sodium: 138 mmol/L (ref 135–145)

## 2023-10-07 LAB — HEPATITIS B SURFACE ANTIGEN: Hepatitis B Surface Ag: NONREACTIVE

## 2023-10-07 LAB — BETA-HYDROXYBUTYRIC ACID: Beta-Hydroxybutyric Acid: 0.4 mmol/L — ABNORMAL HIGH (ref 0.05–0.27)

## 2023-10-07 MED ORDER — TRAZODONE HCL 50 MG PO TABS
50.0000 mg | ORAL_TABLET | Freq: Every evening | ORAL | Status: DC | PRN
  Administered 2023-10-07: 50 mg via ORAL
  Filled 2023-10-07: qty 1

## 2023-10-07 MED ORDER — INSULIN GLARGINE-YFGN 100 UNIT/ML ~~LOC~~ SOLN
10.0000 [IU] | Freq: Every day | SUBCUTANEOUS | Status: DC
  Administered 2023-10-07: 10 [IU] via SUBCUTANEOUS
  Filled 2023-10-07 (×3): qty 0.1

## 2023-10-07 MED ORDER — GABAPENTIN 300 MG PO CAPS
300.0000 mg | ORAL_CAPSULE | Freq: Once | ORAL | Status: AC
  Administered 2023-10-07: 300 mg via ORAL
  Filled 2023-10-07: qty 1

## 2023-10-07 MED ORDER — INSULIN ASPART 100 UNIT/ML IJ SOLN: 0.0000 [IU] | INTRAMUSCULAR | Status: DC

## 2023-10-07 MED ORDER — CHLORPROMAZINE HCL 25 MG/ML IJ SOLN
25.0000 mg | Freq: Once | INTRAMUSCULAR | Status: AC
  Administered 2023-10-07: 25 mg via INTRAMUSCULAR
  Filled 2023-10-07: qty 1

## 2023-10-07 MED ORDER — INSULIN ASPART 100 UNIT/ML IJ SOLN
0.0000 [IU] | INTRAMUSCULAR | Status: DC
  Administered 2023-10-07: 1 [IU] via SUBCUTANEOUS
  Filled 2023-10-07: qty 1

## 2023-10-07 MED ORDER — FAMOTIDINE 20 MG PO TABS
20.0000 mg | ORAL_TABLET | Freq: Once | ORAL | Status: AC
  Administered 2023-10-07: 20 mg via ORAL
  Filled 2023-10-07: qty 1

## 2023-10-07 NOTE — Progress Notes (Signed)
 PROGRESS NOTE    Darrell Walls  WUJ:[id number] DOB: 04-Feb-1991 DOA: 10/05/2023 PCP: Theda Finical, MD  Outpatient Specialists: vascular surgery, endocrinology, nephrology    Brief Narrative:   From admission h and p  Darrell Walls is a 33 y.o. male with medical history significant for ESRD on HD MWF, HTN, OSA on CPAP, anemia of CKD, presenting with abdominal pain and intractable vomiting.  He denied diarrhea, fever, chest pain or shortness of breath.  Presented similarly back in 2023 with diagnosis of DKA with UTI.  Separately, patient complains of pain in his left arm, at the site of his AV fistula for which he was seen on 5/6 at the ED at [facility] that showed no evidence of obstruction in the central veins.   Assessment & Plan:   Principal Problem:   Ketoacidosis Active Problems:   SIRS (systemic inflammatory response syndrome) (HCC)   Abdominal pain   Intractable vomiting with nausea   Hypertensive urgency   ESRD on hemodialysis (HCC)   Pain in left arm   AV fistula thrombosis, subsequent encounter   Type 2 diabetes mellitus without complication (HCC)  # DKA # T2DM Presenting with nausea/vomiting. No infectious source identified.  CXR clear, CT abdomen/pelvis nothing acute. Tolerating diet now, feeling much better, dka appears resolved. A1c is 6. - basal/bolus insulin , q 4 ssi until taking reliable po - monitor cultures, no ua as anuric  # Hypertensive urgency # HTN BPs much improved with resumption of home meds, dialysis, and resolution of nausea/vomiting - copnt home amlodipine , coreg , clonidine, doxazosin, hydralazine , irbesartan  # Thrombosed AV fistula Fistulogram with possible intervention planned for tomorrow  # ESRD On mwf hemodialysis, maintaining that here  # MDD - home sertraline, mirtazapine    DVT prophylaxis: lovenox  Code Status: full Family Communication: none at bedside, no answer when mother telephoned  Level of care: Med-Surg Status is:  Inpatient Remains inpatient appropriate because: severity of illness    Consultants:  Vascular, nephrology  Procedures: None thus far  Antimicrobials:  Received a dose of vancomycin and cefepime in the ER    Subjective: Reports feeling much better, tolerating food, no nause  Objective: Vitals:   10/07/23 1000 10/07/23 1100 10/07/23 1200 10/07/23 1300  BP:  (!) 144/123 (!) 158/87 (!) 146/74  Pulse: 91 90 92 89  Resp: (!) 6 16 15 11   Temp:   98.4 F (36.9 C)   TempSrc:      SpO2: 98% 100% 100% 99%  Weight:      Height:        Intake/Output Summary (Last 24 hours) at 10/07/2023 1504 Last data filed at 10/07/2023 0700 Gross per 24 hour  Intake 1406.04 ml  Output -0.2 ml  Net 1406.24 ml   Filed Weights   10/06/23 0227  Weight: 68.7 kg    Examination:  General exam: Appears chronically ill Respiratory system: Clear to auscultation save for faint rales at bases Cardiovascular system: S1 & S2 heard, RRR.   Gastrointestinal system: Abdomen is nondistended, soft and nontender.   Central nervous system: Alert and oriented. Moving all 4 Extremities: Symmetric 5 x 5 power. Fistula left arm Skin: No rashes, lesions or ulcers. Right subclavian vascular catheter Psychiatry: Judgement and insight appear normal. Mood & affect appropriate.     Data Reviewed: I have personally reviewed following labs and imaging studies  CBC: Recent Labs  Lab 10/05/23 1728  WBC 8.4  HGB 12.7*  HCT 38.1*  MCV 89.0  PLT 360   Basic  Metabolic Panel: Recent Labs  Lab 10/06/23 0503 10/06/23 0832 10/06/23 1507 10/06/23 2150 10/07/23 0552  NA 142 140 136 134* 138  K 4.0 4.1 3.8 3.6 3.5  CL 102 100 97* 96* 97*  CO2 19* 22 19* 21* 23  GLUCOSE 117* 137* 152* 122* 128*  BUN 58* 59* 59* 65* 61*  CREATININE 10.90* 11.41* 12.12* 13.18* 11.83*  CALCIUM  9.0 9.2 9.0 8.9 9.1   GFR: Estimated Creatinine Clearance: 8.8 mL/min (A) (by C-G formula based on SCr of 11.83 mg/dL (H)). Liver  Function Tests: Recent Labs  Lab 10/05/23 1728  AST 24  ALT 21  ALKPHOS 132*  BILITOT 1.7*  PROT 9.4*  ALBUMIN 4.8   Recent Labs  Lab 10/05/23 1728  LIPASE 24   No results for input(s): "AMMONIA" in the last 168 hours. Coagulation Profile: Recent Labs  Lab 10/05/23 1728  INR 1.2   Cardiac Enzymes: No results for input(s): "CKTOTAL", "CKMB", "CKMBINDEX", "TROPONINI" in the last 168 hours. BNP (last 3 results) No results for input(s): "PROBNP" in the last 8760 hours. HbA1C: Recent Labs    10/06/23 0141  HGBA1C 6.0*   CBG: Recent Labs  Lab 10/07/23 0633 10/07/23 0729 10/07/23 0830 10/07/23 0932 10/07/23 1152  GLUCAP 125* 133* 145* 153* 116*   Lipid Profile: No results for input(s): "CHOL", "HDL", "LDLCALC", "TRIG", "CHOLHDL", "LDLDIRECT" in the last 72 hours. Thyroid Function Tests: No results for input(s): "TSH", "T4TOTAL", "FREET4", "T3FREE", "THYROIDAB" in the last 72 hours. Anemia Panel: No results for input(s): "VITAMINB12", "FOLATE", "FERRITIN", "TIBC", "IRON", "RETICCTPCT" in the last 72 hours. Urine analysis:    Component Value Date/Time   COLORURINE YELLOW (A) 05/16/2022 0410   APPEARANCEUR HAZY (A) 05/16/2022 0410   LABSPEC 1.012 05/16/2022 0410   PHURINE 5.0 05/16/2022 0410   GLUCOSEU >=500 (A) 05/16/2022 0410   HGBUR SMALL (A) 05/16/2022 0410   BILIRUBINUR NEGATIVE 05/16/2022 0410   KETONESUR NEGATIVE 05/16/2022 0410   PROTEINUR >=300 (A) 05/16/2022 0410   NITRITE NEGATIVE 05/16/2022 0410   LEUKOCYTESUR NEGATIVE 05/16/2022 0410   Sepsis Labs: @LABRCNTIP (procalcitonin:4,lacticidven:4)  ) Recent Results (from the past 240 hours)  Culture, blood (Routine x 2)     Status: None (Preliminary result)   Collection Time: 10/05/23  5:28 PM   Specimen: BLOOD RIGHT ARM  Result Value Ref Range Status   Specimen Description BLOOD RIGHT ARM  Final   Special Requests   Final    BOTTLES DRAWN AEROBIC AND ANAEROBIC Blood Culture adequate volume    Culture   Final    NO GROWTH 2 DAYS Performed at [facility], [address] [place], [place] [address]    Report Status PENDING  Incomplete  Resp panel by RT-PCR (RSV, Flu A&B, Covid) Anterior Nasal Swab     Status: None   Collection Time: 10/05/23  8:38 PM   Specimen: Anterior Nasal Swab  Result Value Ref Range Status   SARS Coronavirus 2 by RT PCR NEGATIVE NEGATIVE Final    Comment: (NOTE) SARS-CoV-2 target nucleic acids are NOT DETECTED.  The SARS-CoV-2 RNA is generally detectable in upper respiratory specimens during the acute phase of infection. The lowest concentration of SARS-CoV-2 viral copies this assay can detect is 138 copies/mL. A negative result does not preclude SARS-Cov-2 infection and should not be used as the sole basis for treatment or other patient management decisions. A negative result may occur with  improper specimen collection/handling, submission of specimen other than nasopharyngeal swab, presence of viral mutation(s) within the  areas targeted by this assay, and inadequate number of viral copies(<138 copies/mL). A negative result must be combined with clinical observations, patient history, and epidemiological information. The expected result is Negative.  Fact Sheet for Patients:  BloggerCourse.com  Fact Sheet for Healthcare Providers:  SeriousBroker.it  This test is no t yet approved or cleared by the [place]  FDA and  has been authorized for detection and/or diagnosis of SARS-CoV-2 by FDA under an Emergency Use Authorization (EUA). This EUA will remain  in effect (meaning this test can be used) for the duration of the COVID-19 declaration under Section 564(b)(1) of the Act, 21 U.S.C.section 360bbb-3(b)(1), unless the authorization is terminated  or revoked sooner.       Influenza A by PCR NEGATIVE NEGATIVE Final   Influenza B by PCR NEGATIVE NEGATIVE Final    Comment:  (NOTE) The Xpert Xpress SARS-CoV-2/FLU/RSV plus assay is intended as an aid in the diagnosis of influenza from Nasopharyngeal swab specimens and should not be used as a sole basis for treatment. Nasal washings and aspirates are unacceptable for Xpert Xpress SARS-CoV-2/FLU/RSV testing.  Fact Sheet for Patients: BloggerCourse.com  Fact Sheet for Healthcare Providers: SeriousBroker.it  This test is not yet approved or cleared by the [place]  FDA and has been authorized for detection and/or diagnosis of SARS-CoV-2 by FDA under an Emergency Use Authorization (EUA). This EUA will remain in effect (meaning this test can be used) for the duration of the COVID-19 declaration under Section 564(b)(1) of the Act, 21 U.S.C. section 360bbb-3(b)(1), unless the authorization is terminated or revoked.     Resp Syncytial Virus by PCR NEGATIVE NEGATIVE Final    Comment: (NOTE) Fact Sheet for Patients: BloggerCourse.com  Fact Sheet for Healthcare Providers: SeriousBroker.it  This test is not yet approved or cleared by the [place]  FDA and has been authorized for detection and/or diagnosis of SARS-CoV-2 by FDA under an Emergency Use Authorization (EUA). This EUA will remain in effect (meaning this test can be used) for the duration of the COVID-19 declaration under Section 564(b)(1) of the Act, 21 U.S.C. section 360bbb-3(b)(1), unless the authorization is terminated or revoked.  Performed at [facility], [address] [place], [place] [address]   MRSA Next Gen by PCR, Nasal     Status: None   Collection Time: 10/06/23  1:41 AM   Specimen: Nasal Mucosa; Nasal Swab  Result Value Ref Range Status   MRSA by PCR Next Gen NOT DETECTED NOT DETECTED Final    Comment: (NOTE) The GeneXpert MRSA Assay (FDA approved for NASAL specimens only), is one component of a comprehensive MRSA  colonization surveillance program. It is not intended to diagnose MRSA infection nor to guide or monitor treatment for MRSA infections. Test performance is not FDA approved in patients less than 35 years old. Performed at [facility], [address] [place], [place] [address]   Culture, blood (Routine X 2) w Reflex to ID Panel     Status: None (Preliminary result)   Collection Time: 10/06/23  5:03 AM   Specimen: BLOOD  Result Value Ref Range Status   Specimen Description BLOOD BLOOD RIGHT ARM  Final   Special Requests Blood Culture adequate volume  Final   Culture   Final    NO GROWTH 1 DAY Performed at [facility], [address] [place], [place] [address]    Report Status PENDING  Incomplete         Radiology Studies: US  Venous Img Upper Uni  Left Result Date: 10/05/2023 CLINICAL DATA:  left upper extremity pain and swelling, multiple prior fistula surgeries with stenosis, reportedly recent abscess in extremity, evaluting for clot vs cellulitis vs abscess EXAM: LEFT UPPER EXTREMITY VENOUS DOPPLER ULTRASOUND TECHNIQUE: Gray-scale sonography with graded compression, as well as color Doppler and duplex ultrasound were performed to evaluate the upper extremity deep venous system from the level of the subclavian vein and including the jugular, axillary, basilic, radial, ulnar and upper cephalic vein. Spectral Doppler was utilized to evaluate flow at rest and with distal augmentation maneuvers. COMPARISON:  Report from an ultrasound in an outside institution five days ago, images not available. FINDINGS: Contralateral Subclavian Vein: Respiratory phasicity is normal and symmetric with the symptomatic side. No evidence of thrombus. Normal compressibility. Internal Jugular Vein: No evidence of thrombus. Normal compressibility, respiratory phasicity and response to augmentation. Subclavian Vein: No evidence of thrombus. Normal compressibility, respiratory phasicity and  response to augmentation. Axillary Vein: No evidence of thrombus. Normal compressibility, respiratory phasicity and response to augmentation. Cephalic Vein: The cephalic AV fistula described on prior exam is not evaluated. Flow is demonstrated in the cephalic vein. No fluid collection is seen on the current exam Basilic Vein: No evidence of thrombus. Normal compressibility, respiratory phasicity and response to augmentation. Brachial Veins: No evidence of thrombus. Normal compressibility, respiratory phasicity and response to augmentation. Radial Veins: No evidence of thrombus. Normal compressibility, respiratory phasicity and response to augmentation. Ulnar Veins: Fistula at the wrist which appears thrombosed. Other Findings:  No evidence of focal fluid collection. IMPRESSION: 1. 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 2. The cephalic AV fistula described on prior outside exam with associated collection is not seen on provided images. Vascular surgery follow-up is recommended. 3. No evidence of left upper extremity DVT. Electronically Signed   By: Chadwick Colonel M.D.   On: 10/05/2023 21:39   CT ABDOMEN PELVIS WO CONTRAST Result Date: 10/05/2023 CLINICAL DATA:  Black emesis. EXAM: CT ABDOMEN AND PELVIS WITHOUT CONTRAST TECHNIQUE: Multidetector CT imaging of the abdomen and pelvis was performed following the standard protocol without IV contrast. RADIATION DOSE REDUCTION: This exam was performed according to the departmental dose-optimization program which includes automated exposure control, adjustment of the mA and/or kV according to patient size and/or use of iterative reconstruction technique. COMPARISON:  May 15, 2022 FINDINGS: Lower chest: A 12 mm thick anterior pericardial effusion is noted. Hepatobiliary: No focal liver abnormality is seen. No gallstones, gallbladder wall thickening, or biliary dilatation. Pancreas: Unremarkable. No pancreatic ductal dilatation or surrounding inflammatory changes. Spleen:  Normal in size without focal abnormality. Adrenals/Urinary Tract: Adrenal glands are unremarkable. Kidneys are normal, without renal calculi, focal lesion, or hydronephrosis. Urinary bladder is poorly distended. Mild diffuse urinary bladder wall thickening is seen. Stomach/Bowel: Stomach is within normal limits. Appendix appears normal. Stool is seen throughout the large bowel. No evidence of bowel wall thickening, distention, or inflammatory changes. Vascular/Lymphatic: Aortic atherosclerosis with diffuse calcification of the arterial structures within the abdomen and pelvis. No enlarged abdominal or pelvic lymph nodes. Reproductive: Prostate is unremarkable. Other: No abdominal wall hernia or abnormality. No abdominopelvic ascites. Musculoskeletal: No acute or significant osseous findings. IMPRESSION: 1. Small anterior pericardial effusion. 2. Aortic atherosclerosis. Electronically Signed   By: Virgle Grime M.D.   On: 10/05/2023 20:34   DG Chest Port 1 View Result Date: 10/05/2023 CLINICAL DATA:  Suspected sepsis.  Vomiting. EXAM: PORTABLE CHEST 1 VIEW COMPARISON:  Radiograph 04/27/2019 FINDINGS: Right-sided dialysis catheter tip in the right atrium.The cardiomediastinal contours are  normal. The lungs are clear. Pulmonary vasculature is normal. No consolidation, pleural effusion, or pneumothorax. No acute osseous abnormalities are seen. IMPRESSION: No active disease. Electronically Signed   By: Chadwick Colonel M.D.   On: 10/05/2023 19:43        Scheduled Meds:  amLODipine   10 mg Oral Daily   carvedilol   25 mg Oral BID   Chlorhexidine  Gluconate Cloth  6 each Topical Daily   cloNIDine  0.2 mg Transdermal Weekly   heparin   5,000 Units Subcutaneous Q8H   hydrALAZINE   100 mg Oral TID   insulin  aspart  0-6 Units Subcutaneous Q4H   insulin  glargine-yfgn  10 Units Subcutaneous QHS   irbesartan  300 mg Oral QHS   mirtazapine   15 mg Oral QHS   pantoprazole  (PROTONIX ) IV  40 mg Intravenous Q0600    potassium chloride   40 mEq Oral Once   sertraline  50 mg Oral Daily   Continuous Infusions:  dextrose  Stopped (10/07/23 1200)   insulin  Stopped (10/07/23 1000)   promethazine  (PHENERGAN ) injection (IM or IVPB) 12.5 mg (10/07/23 1219)     LOS: 2 days     Raymonde Calico, MD [facility]   If 7PM-7AM, please contact night-coverage [email or web address] Password [facility] 10/07/2023, 3:04 PM

## 2023-10-07 NOTE — Plan of Care (Signed)
  Problem: Education: Goal: Ability to describe self-care measures that may prevent or decrease complications (Diabetes Survival Skills Education) will improve Outcome: Progressing   Problem: Coping: Goal: Ability to adjust to condition or change in health will improve Outcome: Progressing   Problem: Fluid Volume: Goal: Ability to maintain a balanced intake and output will improve Outcome: Not Progressing   Problem: Metabolic: Goal: Ability to maintain appropriate glucose levels will improve Outcome: Progressing   Problem: Nutritional: Goal: Maintenance of adequate nutrition will improve Outcome: Progressing   Problem: Cardiac: Goal: Ability to maintain an adequate cardiac output will improve Outcome: Progressing   Problem: Health Behavior/Discharge Planning: Goal: Ability to identify and utilize available resources and services will improve Outcome: Progressing   Problem: Fluid Volume: Goal: Ability to achieve a balanced intake and output will improve Outcome: Progressing

## 2023-10-07 NOTE — Inpatient Diabetes Management (Addendum)
 Inpatient Diabetes Program Recommendations  AACE/ADA: New Consensus Statement on Inpatient Glycemic Control   Target Ranges:  Prepandial:   less than 140 mg/dL      Peak postprandial:   less than 180 mg/dL (1-2 hours)      Critically ill patients:  140 - 180 mg/dL    Latest Reference Range & Units 10/07/23 01:32 10/07/23 02:22 10/07/23 03:32 10/07/23 04:38 10/07/23 05:44 10/07/23 06:33 10/07/23 07:29 10/07/23 08:30  Glucose-Capillary 70 - 99 mg/dL 161 (H) 096 (H) 045 (H) 117 (H) 135 (H) 125 (H) 133 (H) 145 (H)    Latest Reference Range & Units 10/06/23 14:34 10/06/23 15:31 10/06/23 16:31 10/06/23 17:31 10/06/23 18:33 10/06/23 19:29 10/06/23 20:38 10/06/23 21:30 10/06/23 23:40  Glucose-Capillary 70 - 99 mg/dL 409 (H) 811 (H) 914 (H) 162 (H) 170 (H) 159 (H) 140 (H) 133 (H) 128 (H)    Latest Reference Range & Units 10/05/23 17:28 10/05/23 22:20 10/06/23 01:41 10/06/23 05:03 10/06/23 08:32 10/06/23 21:50 10/07/23 05:52  Beta-Hydroxybutyric Acid 0.05 - 0.27 mmol/L 3.87 (H) 5.26 (H) 5.23 (H) 2.67 (H) 1.27 (H) 0.40 (H) 0.40 (H)   Review of Glycemic Control  Diabetes history: DM2 Outpatient Diabetes medications: Lantus  12 units QPM, Humalog 2-4 units TID with meals; Dexcom G7 CGM Current orders for Inpatient glycemic control: IV insulin    Inpatient Diabetes Program Recommendations:     Insulin : Once provider is ready to transition from IV to SQ insulin , please consider ordering Semglee  10 units Q24H, CBGs Q4H, and Novolog  0-6 units Q4H.   NOTE: Patient presented to ED with nausea, vomiting, and abdominal pain. Patient admitted with ketoacidosis, euglycemic DKA versus starvation ketosis, SIRS, NV, hypertensive urgency, ESRD on HD, and pain in left arm from AV fistula thrombosis.  Patient was started on IV insulin  and currently still on IV insulin . Question if acidosis more likely related to starvation ketosis. Per chart, patient sees [facility] and last seen Dr. Alexia Angelucci on 01/28/23; per office  note patient was asked to continue Lantus  to 12 units daily and restart consistent Humalog 4 units TID with meals plus 1 unit per 50 mg/dl above glucose of 782 mg/dl. Inpatient diabetes coordinator spoke to patient's mother on 10/06/23 regarding patient's DM control. Patient had been having N/V x4 days and was not taking any insulin  during that time. Patient is not using CGM at this time due to issue with insurance trying to get the Dexcom G7 CGM sensors. Per telephone conversation with [facility] pharmacist on 10/06/23, patient now has Medicare and the Dexcom G7 sensors have to be ran under Part B (not under pharmacy benefit).   Addendum 10/07/23@11 :45-Went by to talk with patient about DM control. Patient asleep in bed and does not awaken enough to engage in conversation. Patient's mother at bedside and states he has been sleeping since she arrived and he would not wake up to talk with her this morning either. Discussed with her that I called [facility] and was told that the Dexcom G7 sensors are not covered under pharmacy benefit, it has to be process under Part B of Medicare. Encouraged her to have patient call [phone] number on Medicare card so he can find out what needs to be done to get the Dexcom G7 sensors refilled. Provided Dexcom G7 sensor samples (2) for patient to use after discharge. Patient's mother appreciative of sensor samples and verbalized understanding of information discussed.    Thanks, Beacher Limerick, RN, MSN, CDCES Diabetes Coordinator Inpatient Diabetes Program [phone] (Team Pager from 8am  to 5pm)

## 2023-10-07 NOTE — Progress Notes (Signed)
 [facility]  ROUNDING NOTE   Subjective:   Darrell Walls is a 33 y.o. male with past medical history of hypertension, sleep apnea on CPAP, anemia, and end-stage renal disease on hemodialysis.  Patient presents to the emergency department complaining of abdominal pain and vomiting and has been admitted for Ketoacidosis [E87.29] High anion gap metabolic acidosis [E87.29] Nausea and vomiting, unspecified vomiting type [R11.2]  Patient is known our practice and receives outpatient dialysis treatments at [facility] on a MWF schedule, supervised by Dr. Zelda Hickman.   Update: Patient resting quietly No family present during initial visit Insulin  drip stopped  Mother at bedside later in day Answered all questions.    Objective:  Vital signs in last 24 hours:  Temp:  [98.2 F (36.8 C)-98.9 F (37.2 C)] 98.6 F (37 C) (05/13 1731) Pulse Rate:  [83-100] 88 (05/13 1731) Resp:  [5-22] 16 (05/13 1731) BP: (127-179)/(62-123) 178/100 (05/13 1731) SpO2:  [94 %-100 %] 100 % (05/13 1731)  Weight change:  Filed Weights   10/06/23 0227  Weight: 68.7 kg    Intake/Output: I/O last 3 completed shifts: In: 5239.8 [P.O.:50; I.V.:1476.5; Other:120; IV Piggyback:3593.3] Out: 99.8 [Emesis/NG output:100]   Intake/Output this shift:  No intake/output data recorded.  Physical Exam: General: NAD, weaned  Head: Normocephalic, atraumatic. Moist oral mucosal membranes  Eyes: Anicteric  Lungs:  Clear to auscultation,normal effort  Heart: Regular rate and rhythm  Abdomen:  Soft, nontender  Extremities:  No peripheral edema.  Neurologic: Somnolent  Skin: No lesions  Access: Rt permcath    Basic Metabolic Panel: Recent Labs  Lab 10/06/23 0503 10/06/23 0832 10/06/23 1507 10/06/23 2150 10/07/23 0552  NA 142 140 136 134* 138  K 4.0 4.1 3.8 3.6 3.5  CL 102 100 97* 96* 97*  CO2 19* 22 19* 21* 23  GLUCOSE 117* 137* 152* 122* 128*  BUN 58* 59* 59* 65* 61*  CREATININE  10.90* 11.41* 12.12* 13.18* 11.83*  CALCIUM  9.0 9.2 9.0 8.9 9.1    Liver Function Tests: Recent Labs  Lab 10/05/23 1728  AST 24  ALT 21  ALKPHOS 132*  BILITOT 1.7*  PROT 9.4*  ALBUMIN 4.8   Recent Labs  Lab 10/05/23 1728  LIPASE 24   No results for input(s): "AMMONIA" in the last 168 hours.  CBC: Recent Labs  Lab 10/05/23 1728  WBC 8.4  HGB 12.7*  HCT 38.1*  MCV 89.0  PLT 360    Cardiac Enzymes: No results for input(s): "CKTOTAL", "CKMB", "CKMBINDEX", "TROPONINI" in the last 168 hours.  BNP: Invalid input(s): "POCBNP"  CBG: Recent Labs  Lab 10/07/23 0830 10/07/23 0932 10/07/23 1152 10/07/23 1532 10/07/23 1733  GLUCAP 145* 153* 116* 135* 140*    Microbiology: Results for orders placed or performed during the hospital encounter of 10/05/23  Culture, blood (Routine x 2)     Status: None (Preliminary result)   Collection Time: 10/05/23  5:28 PM   Specimen: BLOOD RIGHT ARM  Result Value Ref Range Status   Specimen Description BLOOD RIGHT ARM  Final   Special Requests   Final    BOTTLES DRAWN AEROBIC AND ANAEROBIC Blood Culture adequate volume   Culture   Final    NO GROWTH 2 DAYS Performed at [facility], [address] [place], [place] [address]    Report Status PENDING  Incomplete  Resp panel by RT-PCR (RSV, Flu A&B, Covid) Anterior Nasal Swab     Status: None   Collection Time: 10/05/23  8:38 PM   Specimen: Anterior Nasal Swab  Result Value Ref Range Status   SARS Coronavirus 2 by RT PCR NEGATIVE NEGATIVE Final    Comment: (NOTE) SARS-CoV-2 target nucleic acids are NOT DETECTED.  The SARS-CoV-2 RNA is generally detectable in upper respiratory specimens during the acute phase of infection. The lowest concentration of SARS-CoV-2 viral copies this assay can detect is 138 copies/mL. A negative result does not preclude SARS-Cov-2 infection and should not be used as the sole basis for treatment or other patient management decisions. A  negative result may occur with  improper specimen collection/handling, submission of specimen other than nasopharyngeal swab, presence of viral mutation(s) within the areas targeted by this assay, and inadequate number of viral copies(<138 copies/mL). A negative result must be combined with clinical observations, patient history, and epidemiological information. The expected result is Negative.  Fact Sheet for Patients:  BloggerCourse.com  Fact Sheet for Healthcare Providers:  SeriousBroker.it  This test is no t yet approved or cleared by the [place]  FDA and  has been authorized for detection and/or diagnosis of SARS-CoV-2 by FDA under an Emergency Use Authorization (EUA). This EUA will remain  in effect (meaning this test can be used) for the duration of the COVID-19 declaration under Section 564(b)(1) of the Act, 21 U.S.C.section 360bbb-3(b)(1), unless the authorization is terminated  or revoked sooner.       Influenza A by PCR NEGATIVE NEGATIVE Final   Influenza B by PCR NEGATIVE NEGATIVE Final    Comment: (NOTE) The Xpert Xpress SARS-CoV-2/FLU/RSV plus assay is intended as an aid in the diagnosis of influenza from Nasopharyngeal swab specimens and should not be used as a sole basis for treatment. Nasal washings and aspirates are unacceptable for Xpert Xpress SARS-CoV-2/FLU/RSV testing.  Fact Sheet for Patients: BloggerCourse.com  Fact Sheet for Healthcare Providers: SeriousBroker.it  This test is not yet approved or cleared by the [place]  FDA and has been authorized for detection and/or diagnosis of SARS-CoV-2 by FDA under an Emergency Use Authorization (EUA). This EUA will remain in effect (meaning this test can be used) for the duration of the COVID-19 declaration under Section 564(b)(1) of the Act, 21 U.S.C. section 360bbb-3(b)(1), unless the authorization  is terminated or revoked.     Resp Syncytial Virus by PCR NEGATIVE NEGATIVE Final    Comment: (NOTE) Fact Sheet for Patients: BloggerCourse.com  Fact Sheet for Healthcare Providers: SeriousBroker.it  This test is not yet approved or cleared by the [place]  FDA and has been authorized for detection and/or diagnosis of SARS-CoV-2 by FDA under an Emergency Use Authorization (EUA). This EUA will remain in effect (meaning this test can be used) for the duration of the COVID-19 declaration under Section 564(b)(1) of the Act, 21 U.S.C. section 360bbb-3(b)(1), unless the authorization is terminated or revoked.  Performed at [facility], [address] [place], [place] [address]   MRSA Next Gen by PCR, Nasal     Status: None   Collection Time: 10/06/23  1:41 AM   Specimen: Nasal Mucosa; Nasal Swab  Result Value Ref Range Status   MRSA by PCR Next Gen NOT DETECTED NOT DETECTED Final    Comment: (NOTE) The GeneXpert MRSA Assay (FDA approved for NASAL specimens only), is one component of a comprehensive MRSA colonization surveillance program. It is not intended to diagnose MRSA infection nor to guide or monitor treatment for MRSA infections. Test performance is not FDA approved in patients less than 75 years old. Performed at [facility], [address], [place], [place] [address]   Culture, blood (Routine X 2) w Reflex to ID Panel     Status: None (Preliminary result)   Collection Time: 10/06/23  5:03 AM   Specimen: BLOOD  Result Value Ref Range Status   Specimen Description BLOOD BLOOD RIGHT ARM  Final   Special Requests Blood Culture adequate volume  Final   Culture   Final    NO GROWTH 1 DAY Performed at [facility], [address], [place], [place] [address]    Report Status PENDING  Incomplete    Coagulation Studies: Recent Labs    10/05/23 1728  LABPROT 15.8*  INR 1.2     Urinalysis: No results for input(s): "COLORURINE", "LABSPEC", "PHURINE", "GLUCOSEU", "HGBUR", "BILIRUBINUR", "KETONESUR", "PROTEINUR", "UROBILINOGEN", "NITRITE", "LEUKOCYTESUR" in the last 72 hours.  Invalid input(s): "APPERANCEUR"    Imaging: US  Venous Img Upper Uni Left Result Date: 10/05/2023 CLINICAL DATA:  left upper extremity pain and swelling, multiple prior fistula surgeries with stenosis, reportedly recent abscess in extremity, evaluting for clot vs cellulitis vs abscess EXAM: LEFT UPPER EXTREMITY VENOUS DOPPLER ULTRASOUND TECHNIQUE: Gray-scale sonography with graded compression, as well as color Doppler and duplex ultrasound were performed to evaluate the upper extremity deep venous system from the level of the subclavian vein and including the jugular, axillary, basilic, radial, ulnar and upper cephalic vein. Spectral Doppler was utilized to evaluate flow at rest and with distal augmentation maneuvers. COMPARISON:  Report from an ultrasound in an outside institution five days ago, images not available. FINDINGS: Contralateral Subclavian Vein: Respiratory phasicity is normal and symmetric with the symptomatic side. No evidence of thrombus. Normal compressibility. Internal Jugular Vein: No evidence of thrombus. Normal compressibility, respiratory phasicity and response to augmentation. Subclavian Vein: No evidence of thrombus. Normal compressibility, respiratory phasicity and response to augmentation. Axillary Vein: No evidence of thrombus. Normal compressibility, respiratory phasicity and response to augmentation. Cephalic Vein: The cephalic AV fistula described on prior exam is not evaluated. Flow is demonstrated in the cephalic vein. No fluid collection is seen on the current exam Basilic Vein: No evidence of thrombus. Normal compressibility, respiratory phasicity and response to augmentation. Brachial Veins: No evidence of thrombus. Normal compressibility, respiratory phasicity and  response to augmentation. Radial Veins: No evidence of thrombus. Normal compressibility, respiratory phasicity and response to augmentation. Ulnar Veins: Fistula at the wrist which appears thrombosed. Other Findings:  No evidence of focal fluid collection. IMPRESSION: 1. 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 2. The cephalic AV fistula described on prior outside exam with associated collection is not seen on provided images. Vascular surgery follow-up is recommended. 3. No evidence of left upper extremity DVT. Electronically Signed   By: Chadwick Colonel M.D.   On: 10/05/2023 21:39   CT ABDOMEN PELVIS WO CONTRAST Result Date: 10/05/2023 CLINICAL DATA:  Black emesis. EXAM: CT ABDOMEN AND PELVIS WITHOUT CONTRAST TECHNIQUE: Multidetector CT imaging of the abdomen and pelvis was performed following the standard protocol without IV contrast. RADIATION DOSE REDUCTION: This exam was performed according to the departmental dose-optimization program which includes automated exposure control, adjustment of the mA and/or kV according to patient size and/or use of iterative reconstruction technique. COMPARISON:  May 15, 2022 FINDINGS: Lower chest: A 12 mm thick anterior pericardial effusion is noted. Hepatobiliary: No focal liver abnormality is seen. No gallstones, gallbladder wall thickening, or biliary dilatation. Pancreas: Unremarkable. No pancreatic ductal dilatation or surrounding inflammatory changes. Spleen: Normal in size without focal abnormality. Adrenals/Urinary Tract: Adrenal glands  are unremarkable. Kidneys are normal, without renal calculi, focal lesion, or hydronephrosis. Urinary bladder is poorly distended. Mild diffuse urinary bladder wall thickening is seen. Stomach/Bowel: Stomach is within normal limits. Appendix appears normal. Stool is seen throughout the large bowel. No evidence of bowel wall thickening, distention, or inflammatory changes. Vascular/Lymphatic: Aortic atherosclerosis with diffuse  calcification of the arterial structures within the abdomen and pelvis. No enlarged abdominal or pelvic lymph nodes. Reproductive: Prostate is unremarkable. Other: No abdominal wall hernia or abnormality. No abdominopelvic ascites. Musculoskeletal: No acute or significant osseous findings. IMPRESSION: 1. Small anterior pericardial effusion. 2. Aortic atherosclerosis. Electronically Signed   By: Virgle Grime M.D.   On: 10/05/2023 20:34   DG Chest Port 1 View Result Date: 10/05/2023 CLINICAL DATA:  Suspected sepsis.  Vomiting. EXAM: PORTABLE CHEST 1 VIEW COMPARISON:  Radiograph 04/27/2019 FINDINGS: Right-sided dialysis catheter tip in the right atrium.The cardiomediastinal contours are normal. The lungs are clear. Pulmonary vasculature is normal. No consolidation, pleural effusion, or pneumothorax. No acute osseous abnormalities are seen. IMPRESSION: No active disease. Electronically Signed   By: Chadwick Colonel M.D.   On: 10/05/2023 19:43     Medications:    promethazine  (PHENERGAN ) injection (IM or IVPB) 12.5 mg (10/07/23 1219)    amLODipine   10 mg Oral Daily   carvedilol   25 mg Oral BID   Chlorhexidine  Gluconate Cloth  6 each Topical Daily   cloNIDine  0.2 mg Transdermal Weekly   heparin   5,000 Units Subcutaneous Q8H   hydrALAZINE   100 mg Oral TID   insulin  aspart  0-6 Units Subcutaneous Q4H   insulin  glargine-yfgn  10 Units Subcutaneous QHS   irbesartan  300 mg Oral QHS   mirtazapine   15 mg Oral QHS   pantoprazole  (PROTONIX ) IV  40 mg Intravenous Q0600   sertraline  50 mg Oral Daily   acetaminophen  **OR** acetaminophen , dextrose , heparin , labetalol, lidocaine -prilocaine, ondansetron  **OR** ondansetron  (ZOFRAN ) IV, pentafluoroprop-tetrafluoroeth, promethazine  (PHENERGAN ) injection (IM or IVPB), traZODone  Assessment/ Plan:  Mr. Darrell Walls is a 33 y.o.  male with past medical history of hypertension, sleep apnea on CPAP, anemia, and end-stage renal disease on hemodialysis.  Patient  presents to the emergency department complaining of abdominal pain and vomiting and has been admitted for Ketoacidosis [E87.29] High anion gap metabolic acidosis [E87.29] Nausea and vomiting, unspecified vomiting type [R11.2]  [facility]/MWF/Rt permcath  End-stage renal disease on hemodialysis.  Next treatment scheduled for Wednesday.  2. Diabetes mellitus type II with chronic kidney disease/renal manifestations: DKA versus ketosis.  Insulin  dependent. Home regimen includes Lantus  and lispro. Most recent hemoglobin A1c is 8.8 in 2023. No outpatient SLGT-2 inhibitors.  Insulin  drip stopped.   3.  Hypertensive urgency.  Home regimen includes furosemide, amlodipine , carvedilol , clonidine, doxazosin, hydralazine , and irbesartan.  Doxazosin currently held. Blood pressure 169/82.   4. Anemia of chronic kidney disease/ kidney injury/chronic disease/acute blood loss:  Lab Results  Component Value Date   HGB 12.7 (L) 10/05/2023    Hemoglobin at goal.  No need for ESA's at this time.   LOS: 2 Darrell Walls 5/13/20255:35 PM

## 2023-10-07 NOTE — Progress Notes (Signed)
 BP 178/100 HR 88, gave PRN labetalol and recheck was 176/88, HR 84. Pt asymptomatic. MD notified. No new orders at this time. One time dose of gabapentin  and Pepcid  administered for hiccups. Pt NPO at midnight for fistulogram tomorrow.

## 2023-10-07 NOTE — Progress Notes (Addendum)
 7829 Treated for blood pressure greater than SBP 160. 0800 Patient will not engage in conversation unless forced. He states he is scheduled for a kidney transplant in June at [facility]. 0900 Patient stated laid around and slept all the time at home. Mom verified this. Mom enables patient to do nothing-that is her baby. 1219 Attempted to eat fruit but vomitted fruit back up. Given prn for nausea 1300 Nausea resolved. Encouraged patient to get up and sit in chair or just move around in room. Patient refused. Stated he laid in bed at home and did nothing, His mom does everything for him. Not talkative at all. 1639 Report called to [facility] RN on 2 C 1645 Patient stood up put his underwear and pajama pants on and walked to his new bed.Transfered to 207 via bed.

## 2023-10-08 ENCOUNTER — Encounter
Admission: EM | Disposition: A | Discharge status: Home / Self Care | Payer: Self-pay | Source: Home / Self Care | Attending: Obstetrics and Gynecology

## 2023-10-08 DIAGNOSIS — T82858A Stenosis of vascular prosthetic devices, implants and grafts, initial encounter: Secondary | ICD-10-CM

## 2023-10-08 DIAGNOSIS — E8729 Other acidosis: Secondary | ICD-10-CM | POA: Diagnosis not present

## 2023-10-08 DIAGNOSIS — T82868A Thrombosis of vascular prosthetic devices, implants and grafts, initial encounter: Principal | ICD-10-CM

## 2023-10-08 DIAGNOSIS — Z992 Dependence on renal dialysis: Secondary | ICD-10-CM

## 2023-10-08 DIAGNOSIS — N186 End stage renal disease: Secondary | ICD-10-CM

## 2023-10-08 HISTORY — PX: A/V FISTULAGRAM: CATH118298

## 2023-10-08 LAB — GLUCOSE, CAPILLARY
Glucose-Capillary: 103 mg/dL — ABNORMAL HIGH (ref 70–99)
Glucose-Capillary: 81 mg/dL (ref 70–99)
Glucose-Capillary: 82 mg/dL (ref 70–99)

## 2023-10-08 LAB — BASIC METABOLIC PANEL WITH GFR
Anion gap: 17 — ABNORMAL HIGH (ref 5–15)
BUN: 73 mg/dL — ABNORMAL HIGH (ref 6–20)
CO2: 20 mmol/L — ABNORMAL LOW (ref 22–32)
Calcium: 7.7 mg/dL — ABNORMAL LOW (ref 8.9–10.3)
Chloride: 99 mmol/L (ref 98–111)
Creatinine, Ser: 14.76 mg/dL — ABNORMAL HIGH (ref 0.61–1.24)
GFR, Estimated: 4 mL/min — ABNORMAL LOW (ref >60)
Glucose, Bld: 104 mg/dL — ABNORMAL HIGH (ref 70–99)
Potassium: 3.7 mmol/L (ref 3.5–5.1)
Sodium: 136 mmol/L (ref 135–145)

## 2023-10-08 LAB — CBC
HCT: 32.3 % — ABNORMAL LOW (ref 39.0–52.0)
Hemoglobin: 10.8 g/dL — ABNORMAL LOW (ref 13.0–17.0)
MCH: 30.1 pg (ref 26.0–34.0)
MCHC: 33.4 g/dL (ref 30.0–36.0)
MCV: 90 fL (ref 80.0–100.0)
Platelets: 266 10*3/uL (ref 150–400)
RBC: 3.59 MIL/uL — ABNORMAL LOW (ref 4.22–5.81)
RDW: 14.7 % (ref 11.5–15.5)
WBC: 10.7 10*3/uL — ABNORMAL HIGH (ref 4.0–10.5)
nRBC: 0 % (ref 0.0–0.2)

## 2023-10-08 LAB — HEPATITIS B SURFACE ANTIBODY, QUANTITATIVE: Hep B S AB Quant (Post): <3.5 m[IU]/mL — ABNORMAL LOW

## 2023-10-08 SURGERY — A/V FISTULAGRAM
Anesthesia: Moderate Sedation | Laterality: Left

## 2023-10-08 MED ORDER — ALTEPLASE 1 MG/ML SYRINGE FOR VASCULAR PROCEDURE
INTRAMUSCULAR | Status: DC | PRN
  Administered 2023-10-08: 4 mg via INTRA_ARTERIAL

## 2023-10-08 MED ORDER — CEFAZOLIN SODIUM-DEXTROSE 1-4 GM/50ML-% IV SOLN
1.0000 g | INTRAVENOUS | Status: AC
  Administered 2023-10-08: 1 g via INTRAVENOUS

## 2023-10-08 MED ORDER — HEPARIN SODIUM (PORCINE) 1000 UNIT/ML IJ SOLN
INTRAMUSCULAR | Status: AC
  Filled 2023-10-08: qty 10

## 2023-10-08 MED ORDER — FENTANYL CITRATE (PF) 100 MCG/2ML IJ SOLN
INTRAMUSCULAR | Status: DC | PRN
  Administered 2023-10-08: 50 ug via INTRAVENOUS
  Administered 2023-10-08 (×6): 25 ug via INTRAVENOUS

## 2023-10-08 MED ORDER — MIDAZOLAM HCL 2 MG/ML PO SYRP: 8.0000 mg | ORAL_SOLUTION | Freq: Once | ORAL | Status: DC | PRN

## 2023-10-08 MED ORDER — FENTANYL CITRATE (PF) 100 MCG/2ML IJ SOLN
INTRAMUSCULAR | Status: AC
  Filled 2023-10-08: qty 2

## 2023-10-08 MED ORDER — ALTEPLASE 2 MG IJ SOLR
INTRAMUSCULAR | Status: AC
  Filled 2023-10-08: qty 4

## 2023-10-08 MED ORDER — ONDANSETRON HCL 4 MG/2ML IJ SOLN
INTRAMUSCULAR | Status: AC
  Filled 2023-10-08: qty 2

## 2023-10-08 MED ORDER — IODIXANOL 320 MG/ML IV SOLN
INTRAVENOUS | Status: DC | PRN
  Administered 2023-10-08: 25 mL

## 2023-10-08 MED ORDER — FAMOTIDINE 20 MG PO TABS: 40.0000 mg | ORAL_TABLET | Freq: Once | ORAL | Status: DC | PRN

## 2023-10-08 MED ORDER — METHYLPREDNISOLONE SODIUM SUCC 125 MG IJ SOLR: 125.0000 mg | Freq: Once | INTRAMUSCULAR | Status: DC | PRN

## 2023-10-08 MED ORDER — LABETALOL HCL 5 MG/ML IV SOLN
INTRAVENOUS | Status: AC
  Filled 2023-10-08: qty 4

## 2023-10-08 MED ORDER — MIDAZOLAM HCL 2 MG/2ML IJ SOLN
INTRAMUSCULAR | Status: DC | PRN
  Administered 2023-10-08: .5 mg via INTRAVENOUS
  Administered 2023-10-08: 2 mg via INTRAVENOUS
  Administered 2023-10-08 (×3): .5 mg via INTRAVENOUS
  Administered 2023-10-08: 1 mg via INTRAVENOUS

## 2023-10-08 MED ORDER — HEPARIN (PORCINE) IN NACL 1000-0.9 UT/500ML-% IV SOLN
INTRAVENOUS | Status: DC | PRN
  Administered 2023-10-08: 500 mL

## 2023-10-08 MED ORDER — DIPHENHYDRAMINE HCL 50 MG/ML IJ SOLN: 50.0000 mg | Freq: Once | INTRAMUSCULAR | Status: DC | PRN

## 2023-10-08 MED ORDER — LIDOCAINE-EPINEPHRINE (PF) 1 %-1:200000 IJ SOLN
INTRAMUSCULAR | Status: DC | PRN
  Administered 2023-10-08: 10 mL

## 2023-10-08 MED ORDER — SODIUM CHLORIDE 0.9 % IV SOLN: INTRAVENOUS | Status: DC

## 2023-10-08 MED ORDER — MIDAZOLAM HCL 5 MG/5ML IJ SOLN
INTRAMUSCULAR | Status: AC
  Filled 2023-10-08: qty 5

## 2023-10-08 MED ORDER — HEPARIN SODIUM (PORCINE) 1000 UNIT/ML IJ SOLN
INTRAMUSCULAR | Status: DC | PRN
Start: 2023-10-08 — End: 2023-10-08
  Administered 2023-10-08: 4000 [IU] via INTRAVENOUS

## 2023-10-08 MED ORDER — LABETALOL HCL 5 MG/ML IV SOLN
INTRAVENOUS | Status: AC
Start: 2023-10-08 — End: ?
  Filled 2023-10-08: qty 4

## 2023-10-08 MED ORDER — INSULIN ASPART 100 UNIT/ML IJ SOLN: 0.0000 [IU] | Freq: Three times a day (TID) | INTRAMUSCULAR | Status: DC

## 2023-10-08 SURGICAL SUPPLY — 14 items
BALLOON LUTONIX AV 6X60X75 (BALLOONS) IMPLANT
BALLOON ULTRVRSE 5X100X75 (BALLOONS) IMPLANT
CATH BEACON 5 .035 40 KMP TP (CATHETERS) IMPLANT
CATH THROMBEC 7F 65 CLEANER15 (CATHETERS) IMPLANT
COVER PROBE ULTRASOUND 5X96 (MISCELLANEOUS) IMPLANT
DEVICE PRESTO INFLATION (MISCELLANEOUS) IMPLANT
DRAPE BRACHIAL (DRAPES) IMPLANT
GUIDEWIRE ADV .018X180CM (WIRE) IMPLANT
KIT MICROPUNCTURE VSI 5F STIFF (SHEATH) IMPLANT
PACK ANGIOGRAPHY (CUSTOM PROCEDURE TRAY) ×1 IMPLANT
SHEATH BRITE TIP 6FRX5.5 (SHEATH) IMPLANT
SHEATH BRITE TIP 7FRX5.5 (SHEATH) IMPLANT
STENT VIABAHN 6X50X120 (Permanent Stent) IMPLANT
SUT MNCRL AB 4-0 PS2 18 (SUTURE) IMPLANT

## 2023-10-08 NOTE — Plan of Care (Signed)

## 2023-10-08 NOTE — Op Note (Signed)
 [facility]    OPERATIVE NOTE   PROCEDURE: 1.  Left radiocephalic arteriovenous fistula cannulation under ultrasound guidance  2.  Left arm fistulagram  3.  Catheter directed thrombolysis with 4 mg of TPA  4.  Mechanical thrombectomy to the left radiocephalic AV fistula with the 7 [place] cleaner device 5.  Percutaneous transluminal angioplasty of the forearm cephalic vein with 5 mm diameter angioplasty balloon 6.  Stent placement of the most proximal forearm cephalic vein with 6 mm diameter by 5 cm length Viabahn stent   PRE-OPERATIVE DIAGNOSIS: 1. ESRD 2.  Thrombosed left radiocephalic arteriovenous fistula  POST-OPERATIVE DIAGNOSIS: same as above   SURGEON: Mikki Alexander, MD  ANESTHESIA: local with Moderate Conscious Sedation for 34 minutes using 5 mg of Versed  and 200 mcg of Fentanyl   ESTIMATED BLOOD LOSS: 20 cc  FINDING(S): Thrombosed left radiocephalic AV fistula including the previously placed Viabahn stent.  Stenosis just proximal to the stent just beyond the antecubital fossa and the cephalic vein of greater than 13%.  Outflow in the upper arm was predominantly through the deep venous system.  SPECIMEN(S):  None  CONTRAST: 25 cc  FLUORO TIME: 4.1 minutes  INDICATIONS: Patient is a 33 y.o.male who presents with a thrombosed left radiocephalic arteriovenous fistula.  The patient is scheduled for an attempted declot and fistulagram.  The patient is aware the risks include but are not limited to: bleeding, infection, thrombosis of the cannulated access, and possible anaphylactic reaction to the contrast.  The patient is aware of the risks of the procedure and elects to proceed forward.  DESCRIPTION: After full informed written consent was obtained, the patient was brought back to the angiography suite and placed supine upon the angiography table.  The patient was connected to monitoring equipment. Moderate conscious sedation was administered during a  face to face encounter with the patient throughout the procedure with my supervision of the RN administering medicines and monitoring the patient's vital signs, pulse oximetry, telemetry and mental status throughout from the start of the procedure until the patient was taken to the recovery room. The left arm was prepped and draped in the standard fashion for a percutaneous access intervention.  Under ultrasound guidance, the left radiocephalic arteriovenous fistula was cannulated with a micropuncture needle under direct ultrasound guidance due to the pulseless nature of the fistula in an antegrade fashion near the radiocephalic anastomosis, and permanent images were performed.  The microwire was advanced and the needle was exchanged for the a microsheath.  I then upsized to a 6 Fr Sheath and imaging was performed.  Hand injections were completed to image the access. This demonstrated thrombosis of the AVF.  Based on the images, this patient will need extensive treatment to salvage the graft. I then gave the patient 4000 units of intravenous heparin  and we upsized to a 7 [place] sheath.  4 mg of TPA were deployed. This was allowed to dwell. Mechanical thrombectomy was then performed using the 7 French cleaner device with multiple passes made in the forearm cephalic vein including the previously placed stent in the cephalic vein. This uncovered a greater than 80% stenosis in the proximal forearm cephalic vein just beyond the previously placed stent.  I then elected to treat this with angioplasty.  A 5 mm diameter by 10 cm length angioplasty balloon was inflated in the proximal forearm cephalic vein and taken up to 12 atm.  Completion imaging showed greater than 50% residual stenosis.  I elected to treat  this with a stent.  A 6 mm diameter by 5 cm length Viabahn stent was then selected and deployed.  This crossed the median antecubital vein as the stenosis involve this area stop short of the deep venous system at the  antecubital fossa.  This was postdilated with 6 mm balloon with excellent angiographic completion result and now with brisk flow through the fistula/stent and less than 10% residual stenosis.    Based on the completion imaging, no further intervention is necessary.  The wire and balloon were removed from the sheath.  A 4-0 Monocryl purse-string suture was sewn around the sheath.  The sheath was removed while tying down the suture.  A sterile bandage was applied to the puncture site.  COMPLICATIONS: None  CONDITION: Stable   Mikki Alexander 10/08/2023 4:47 PM   This note was created with Dragon Medical transcription system. Any errors in dictation are purely unintentional.

## 2023-10-08 NOTE — Progress Notes (Addendum)
 [facility]  ROUNDING NOTE   Subjective:   Darrell Walls is a 33 y.o. male with past medical history of hypertension, sleep apnea on CPAP, anemia, and end-stage renal disease on hemodialysis.  Patient presents to the emergency department complaining of abdominal pain and vomiting and has been admitted for Ketoacidosis [E87.29] High anion gap metabolic acidosis [E87.29] Nausea and vomiting, unspecified vomiting type [R11.2]  Patient is known our practice and receives outpatient dialysis treatments at [facility] on a MWF schedule, supervised by Dr. Zelda Hickman.   Update: Patient seen and evaluated during dialysis   HEMODIALYSIS FLOWSHEET:  Blood Flow Rate (mL/min): 349 mL/min Arterial Pressure (mmHg): -170.09 mmHg Venous Pressure (mmHg): 137.37 mmHg TMP (mmHg): 7.07 mmHg Ultrafiltration Rate (mL/min): 534 mL/min Dialysate Flow Rate (mL/min): 299 ml/min  Denies pain or discomfort Remains on room air No lower extremity edema N.p.o. for vascular procedure.   Objective:  Vital signs in last 24 hours:  Temp:  [98.2 F (36.8 C)-98.7 F (37.1 C)] 98.4 F (36.9 C) (05/14 0828) Pulse Rate:  [82-93] 84 (05/14 1030) Resp:  [11-18] 13 (05/14 1030) BP: (131-191)/(74-106) 150/84 (05/14 1030) SpO2:  [98 %-100 %] 99 % (05/14 1030) Weight:  [82.2 kg] 82.2 kg (05/14 0828)  Weight change:  Filed Weights   10/06/23 0227 10/08/23 0828  Weight: 68.7 kg 82.2 kg    Intake/Output: I/O last 3 completed shifts: In: 642.1 [I.V.:592.1; IV Piggyback:50] Out: -0.2    Intake/Output this shift:  No intake/output data recorded.  Physical Exam: General: NAD  Head: Normocephalic, atraumatic. Moist oral mucosal membranes  Eyes: Anicteric  Lungs:  Clear to auscultation,normal effort  Heart: Regular rate and rhythm  Abdomen:  Soft, nontender  Extremities:  No peripheral edema.  Neurologic: Alert  Skin: No lesions  Access: Rt permcath, aVF (no bruit or thrill)    Basic  Metabolic Panel: Recent Labs  Lab 10/06/23 0832 10/06/23 1507 10/06/23 2150 10/07/23 0552 10/08/23 0621  NA 140 136 134* 138 136  K 4.1 3.8 3.6 3.5 3.7  CL 100 97* 96* 97* 99  CO2 22 19* 21* 23 20*  GLUCOSE 137* 152* 122* 128* 104*  BUN 59* 59* 65* 61* 73*  CREATININE 11.41* 12.12* 13.18* 11.83* 14.76*  CALCIUM  9.2 9.0 8.9 9.1 7.7*    Liver Function Tests: Recent Labs  Lab 10/05/23 1728  AST 24  ALT 21  ALKPHOS 132*  BILITOT 1.7*  PROT 9.4*  ALBUMIN 4.8   Recent Labs  Lab 10/05/23 1728  LIPASE 24   No results for input(s): "AMMONIA" in the last 168 hours.  CBC: Recent Labs  Lab 10/05/23 1728 10/08/23 0852  WBC 8.4 10.7*  HGB 12.7* 10.8*  HCT 38.1* 32.3*  MCV 89.0 90.0  PLT 360 266    Cardiac Enzymes: No results for input(s): "CKTOTAL", "CKMB", "CKMBINDEX", "TROPONINI" in the last 168 hours.  BNP: Invalid input(s): "POCBNP"  CBG: Recent Labs  Lab 10/07/23 1532 10/07/23 1733 10/07/23 2111 10/07/23 2319 10/08/23 0505  GLUCAP 135* 140* 125* 133* 103*    Microbiology: Results for orders placed or performed during the hospital encounter of 10/05/23  Culture, blood (Routine x 2)     Status: None (Preliminary result)   Collection Time: 10/05/23  5:28 PM   Specimen: BLOOD RIGHT ARM  Result Value Ref Range Status   Specimen Description BLOOD RIGHT ARM  Final   Special Requests   Final    BOTTLES DRAWN AEROBIC AND ANAEROBIC Blood Culture adequate volume  Culture   Final    NO GROWTH 3 DAYS Performed at [facility], [address] [place], [place] [address]    Report Status PENDING  Incomplete  Resp panel by RT-PCR (RSV, Flu A&B, Covid) Anterior Nasal Swab     Status: None   Collection Time: 10/05/23  8:38 PM   Specimen: Anterior Nasal Swab  Result Value Ref Range Status   SARS Coronavirus 2 by RT PCR NEGATIVE NEGATIVE Final    Comment: (NOTE) SARS-CoV-2 target nucleic acids are NOT DETECTED.  The SARS-CoV-2 RNA is generally  detectable in upper respiratory specimens during the acute phase of infection. The lowest concentration of SARS-CoV-2 viral copies this assay can detect is 138 copies/mL. A negative result does not preclude SARS-Cov-2 infection and should not be used as the sole basis for treatment or other patient management decisions. A negative result may occur with  improper specimen collection/handling, submission of specimen other than nasopharyngeal swab, presence of viral mutation(s) within the areas targeted by this assay, and inadequate number of viral copies(<138 copies/mL). A negative result must be combined with clinical observations, patient history, and epidemiological information. The expected result is Negative.  Fact Sheet for Patients:  BloggerCourse.com  Fact Sheet for Healthcare Providers:  SeriousBroker.it  This test is no t yet approved or cleared by the [place]  FDA and  has been authorized for detection and/or diagnosis of SARS-CoV-2 by FDA under an Emergency Use Authorization (EUA). This EUA will remain  in effect (meaning this test can be used) for the duration of the COVID-19 declaration under Section 564(b)(1) of the Act, 21 U.S.C.section 360bbb-3(b)(1), unless the authorization is terminated  or revoked sooner.       Influenza A by PCR NEGATIVE NEGATIVE Final   Influenza B by PCR NEGATIVE NEGATIVE Final    Comment: (NOTE) The Xpert Xpress SARS-CoV-2/FLU/RSV plus assay is intended as an aid in the diagnosis of influenza from Nasopharyngeal swab specimens and should not be used as a sole basis for treatment. Nasal washings and aspirates are unacceptable for Xpert Xpress SARS-CoV-2/FLU/RSV testing.  Fact Sheet for Patients: BloggerCourse.com  Fact Sheet for Healthcare Providers: SeriousBroker.it  This test is not yet approved or cleared by the [place]  FDA  and has been authorized for detection and/or diagnosis of SARS-CoV-2 by FDA under an Emergency Use Authorization (EUA). This EUA will remain in effect (meaning this test can be used) for the duration of the COVID-19 declaration under Section 564(b)(1) of the Act, 21 U.S.C. section 360bbb-3(b)(1), unless the authorization is terminated or revoked.     Resp Syncytial Virus by PCR NEGATIVE NEGATIVE Final    Comment: (NOTE) Fact Sheet for Patients: BloggerCourse.com  Fact Sheet for Healthcare Providers: SeriousBroker.it  This test is not yet approved or cleared by the [place]  FDA and has been authorized for detection and/or diagnosis of SARS-CoV-2 by FDA under an Emergency Use Authorization (EUA). This EUA will remain in effect (meaning this test can be used) for the duration of the COVID-19 declaration under Section 564(b)(1) of the Act, 21 U.S.C. section 360bbb-3(b)(1), unless the authorization is terminated or revoked.  Performed at [facility], [address] [place], [place] [address]   MRSA Next Gen by PCR, Nasal     Status: None   Collection Time: 10/06/23  1:41 AM   Specimen: Nasal Mucosa; Nasal Swab  Result Value Ref Range Status   MRSA by PCR Next Gen NOT DETECTED NOT DETECTED Final  Comment: (NOTE) The GeneXpert MRSA Assay (FDA approved for NASAL specimens only), is one component of a comprehensive MRSA colonization surveillance program. It is not intended to diagnose MRSA infection nor to guide or monitor treatment for MRSA infections. Test performance is not FDA approved in patients less than 28 years old. Performed at [facility], [address] [place], [place] [address]   Culture, blood (Routine X 2) w Reflex to ID Panel     Status: None (Preliminary result)   Collection Time: 10/06/23  5:03 AM   Specimen: BLOOD  Result Value Ref Range Status   Specimen Description BLOOD BLOOD  RIGHT ARM  Final   Special Requests Blood Culture adequate volume  Final   Culture   Final    NO GROWTH 2 DAYS Performed at [facility], [address], [place], [place] [address]    Report Status PENDING  Incomplete    Coagulation Studies: Recent Labs    10/05/23 1728  LABPROT 15.8*  INR 1.2    Urinalysis: No results for input(s): "COLORURINE", "LABSPEC", "PHURINE", "GLUCOSEU", "HGBUR", "BILIRUBINUR", "KETONESUR", "PROTEINUR", "UROBILINOGEN", "NITRITE", "LEUKOCYTESUR" in the last 72 hours.  Invalid input(s): "APPERANCEUR"    Imaging: No results found.    Medications:    promethazine  (PHENERGAN ) injection (IM or IVPB) 12.5 mg (10/07/23 1219)    amLODipine   10 mg Oral Daily   carvedilol   25 mg Oral BID   Chlorhexidine  Gluconate Cloth  6 each Topical Daily   cloNIDine  0.2 mg Transdermal Weekly   heparin   5,000 Units Subcutaneous Q8H   hydrALAZINE   100 mg Oral TID   insulin  aspart  0-6 Units Subcutaneous Q4H   insulin  glargine-yfgn  10 Units Subcutaneous QHS   irbesartan  300 mg Oral QHS   mirtazapine   15 mg Oral QHS   pantoprazole  (PROTONIX ) IV  40 mg Intravenous Q0600   sertraline  50 mg Oral Daily   acetaminophen  **OR** acetaminophen , dextrose , heparin , labetalol, lidocaine -prilocaine, ondansetron  **OR** ondansetron  (ZOFRAN ) IV, pentafluoroprop-tetrafluoroeth, promethazine  (PHENERGAN ) injection (IM or IVPB), traZODone  Assessment/ Plan:  Mr. Darrell Walls is a 33 y.o.  male with past medical history of hypertension, sleep apnea on CPAP, anemia, and end-stage renal disease on hemodialysis.  Patient presents to the emergency department complaining of abdominal pain and vomiting and has been admitted for Ketoacidosis [E87.29] High anion gap metabolic acidosis [E87.29] Nausea and vomiting, unspecified vomiting type [R11.2]  [facility]/MWF/Rt permcath  End-stage renal disease on hemodialysis.  Patient receiving dialysis today, UF goal 500 mL as  tolerated.  Next treatment scheduled for Friday.  2. Diabetes mellitus type II with chronic kidney disease/renal manifestations: DKA versus ketosis.  Insulin  dependent. Home regimen includes Lantus  and lispro. Most recent hemoglobin A1c is 8.8 in 2023. No outpatient SLGT-2 inhibitors.  Placed on insulin  drip at admission.  Primary team to manage sliding scale insulin .  3.  Hypertensive urgency.  Home regimen includes furosemide, amlodipine , carvedilol , clonidine, doxazosin, hydralazine , and irbesartan.  Doxazosin currently held. Blood pressure 181/97 during dialysis.  4. Anemia of chronic kidney disease/ kidney injury/chronic disease/acute blood loss:  Lab Results  Component Value Date   HGB 10.8 (L) 10/08/2023    Hemoglobin remains within acceptable range.  5.  Malfunctioning dialysis access, thrombosed AVF.  Fistulogram planned for later today via vascular surgery.   LOS: 3 Darrell Walls 5/14/202511:03 AM

## 2023-10-08 NOTE — Interval H&P Note (Signed)
 History and Physical Interval Note:  10/08/2023 4:05 PM  Darrell Walls  has presented today for surgery, with the diagnosis of ESRD.  The various methods of treatment have been discussed with the patient and family. After consideration of risks, benefits and other options for treatment, the patient has consented to  Procedure(s): A/V FISTULAGRAM (Left) as a surgical intervention.  The patient's history has been reviewed, patient examined, no change in status, stable for surgery.  I have reviewed the patient's chart and labs.  Questions were answered to the patient's satisfaction.     [name]

## 2023-10-08 NOTE — Progress Notes (Signed)
 PROGRESS NOTE    Darrell Walls  UEA:[id number] DOB: 01-01-1991 DOA: 10/05/2023 PCP: Theda Finical, MD  Outpatient Specialists: vascular surgery, endocrinology, nephrology    Brief Narrative:   From admission h and p  Darrell Walls is a 33 y.o. male with medical history significant for ESRD on HD MWF, HTN, OSA on CPAP, anemia of CKD, presenting with abdominal pain and intractable vomiting.  He denied diarrhea, fever, chest pain or shortness of breath.  Presented similarly back in 2023 with diagnosis of DKA with UTI.  Separately, patient complains of pain in his left arm, at the site of his AV fistula for which he was seen on 5/6 at the ED at [facility] that showed no evidence of obstruction in the central veins.   Assessment & Plan:   Principal Problem:   Ketoacidosis Active Problems:   SIRS (systemic inflammatory response syndrome) (HCC)   Abdominal pain   Intractable vomiting with nausea   Hypertensive urgency   ESRD on hemodialysis (HCC)   Pain in left arm   AV fistula thrombosis, subsequent encounter   Type 2 diabetes mellitus without complication (HCC)  # DKA # T2DM Presenting with nausea/vomiting. No infectious source identified.  CXR clear, CT abdomen/pelvis nothing acute. Tolerating diet now, feeling much better, dka appears resolved. A1c is 6. - basal/bolus insulin , SSI - monitor cultures, no ua as anuric  # Hypertensive urgency # HTN BPs much improved with resumption of home meds, dialysis, and resolution of nausea/vomiting - copnt home amlodipine , coreg , clonidine, doxazosin, hydralazine , irbesartan  # Thrombosed AV fistula Fistulogram with possible intervention planned for later this afternoon  # ESRD On mwf hemodialysis, maintaining that here. Received dialysis this AM  # MDD - home sertraline, mirtazapine    DVT prophylaxis: lovenox  Code Status: full Family Communication: mother at bedside 5/13  Level of care: Med-Surg Status is: Inpatient Remains  inpatient appropriate because: procedure today    Consultants:  Vascular, nephrology  Procedures: Fistulogram scheduled for this afternoon  Antimicrobials:  Received a dose of vancomycin and cefepime in the ER    Subjective: Feeling more or less back to baseline, tolerating PO  Objective: Vitals:   10/08/23 1310 10/08/23 1319 10/08/23 1336 10/08/23 1440  BP: (!) 205/103 (!) 195/103 (!) 182/90 (!) 154/97  Pulse: 90 88 88 88  Resp: 17 (!) 4 16 20   Temp:   99.7 F (37.6 C) 98.7 F (37.1 C)  TempSrc:   Oral Oral  SpO2: 98% 100% 100% 97%  Weight: 79.5 kg   79.5 kg  Height:    5\' 11"  (1.803 m)    Intake/Output Summary (Last 24 hours) at 10/08/2023 1632 Last data filed at 10/08/2023 1300 Gross per 24 hour  Intake --  Output 1000 ml  Net -1000 ml   Filed Weights   10/08/23 0828 10/08/23 1310 10/08/23 1440  Weight: 82.2 kg 79.5 kg 79.5 kg    Examination:  General exam: Appears chronically ill Respiratory system: Clear to auscultation save for faint rales at bases Cardiovascular system: S1 & S2 heard, RRR.   Gastrointestinal system: Abdomen is nondistended, soft and nontender.   Central nervous system: Alert and oriented. Moving all 4 Extremities: Symmetric 5 x 5 power. Fistula left arm.   Skin: No rashes, lesions or ulcers. Right upper chest vascular catheter Psychiatry: Judgement and insight appear normal. Mood & affect appropriate.     Data Reviewed: I have personally reviewed following labs and imaging studies  CBC: Recent Labs  Lab 10/05/23 1728 10/08/23  0852  WBC 8.4 10.7*  HGB 12.7* 10.8*  HCT 38.1* 32.3*  MCV 89.0 90.0  PLT 360 266   Basic Metabolic Panel: Recent Labs  Lab 10/06/23 0832 10/06/23 1507 10/06/23 2150 10/07/23 0552 10/08/23 0621  NA 140 136 134* 138 136  K 4.1 3.8 3.6 3.5 3.7  CL 100 97* 96* 97* 99  CO2 22 19* 21* 23 20*  GLUCOSE 137* 152* 122* 128* 104*  BUN 59* 59* 65* 61* 73*  CREATININE 11.41* 12.12* 13.18* 11.83* 14.76*   CALCIUM  9.2 9.0 8.9 9.1 7.7*   GFR: Estimated Creatinine Clearance: 7.7 mL/min (A) (by C-G formula based on SCr of 14.76 mg/dL (H)). Liver Function Tests: Recent Labs  Lab 10/05/23 1728  AST 24  ALT 21  ALKPHOS 132*  BILITOT 1.7*  PROT 9.4*  ALBUMIN 4.8   Recent Labs  Lab 10/05/23 1728  LIPASE 24   No results for input(s): "AMMONIA" in the last 168 hours. Coagulation Profile: Recent Labs  Lab 10/05/23 1728  INR 1.2   Cardiac Enzymes: No results for input(s): "CKTOTAL", "CKMB", "CKMBINDEX", "TROPONINI" in the last 168 hours. BNP (last 3 results) No results for input(s): "PROBNP" in the last 8760 hours. HbA1C: Recent Labs    10/06/23 0141  HGBA1C 6.0*   CBG: Recent Labs  Lab 10/07/23 1733 10/07/23 2111 10/07/23 2319 10/08/23 0505 10/08/23 1332  GLUCAP 140* 125* 133* 103* 81   Lipid Profile: No results for input(s): "CHOL", "HDL", "LDLCALC", "TRIG", "CHOLHDL", "LDLDIRECT" in the last 72 hours. Thyroid Function Tests: No results for input(s): "TSH", "T4TOTAL", "FREET4", "T3FREE", "THYROIDAB" in the last 72 hours. Anemia Panel: No results for input(s): "VITAMINB12", "FOLATE", "FERRITIN", "TIBC", "IRON", "RETICCTPCT" in the last 72 hours. Urine analysis:    Component Value Date/Time   COLORURINE YELLOW (A) 05/16/2022 0410   APPEARANCEUR HAZY (A) 05/16/2022 0410   LABSPEC 1.012 05/16/2022 0410   PHURINE 5.0 05/16/2022 0410   GLUCOSEU >=500 (A) 05/16/2022 0410   HGBUR SMALL (A) 05/16/2022 0410   BILIRUBINUR NEGATIVE 05/16/2022 0410   KETONESUR NEGATIVE 05/16/2022 0410   PROTEINUR >=300 (A) 05/16/2022 0410   NITRITE NEGATIVE 05/16/2022 0410   LEUKOCYTESUR NEGATIVE 05/16/2022 0410   Sepsis Labs: @LABRCNTIP (procalcitonin:4,lacticidven:4)  ) Recent Results (from the past 240 hours)  Culture, blood (Routine x 2)     Status: None (Preliminary result)   Collection Time: 10/05/23  5:28 PM   Specimen: BLOOD RIGHT ARM  Result Value Ref Range Status    Specimen Description BLOOD RIGHT ARM  Final   Special Requests   Final    BOTTLES DRAWN AEROBIC AND ANAEROBIC Blood Culture adequate volume   Culture   Final    NO GROWTH 3 DAYS Performed at [facility], [address] [place], [place] [address]    Report Status PENDING  Incomplete  Resp panel by RT-PCR (RSV, Flu A&B, Covid) Anterior Nasal Swab     Status: None   Collection Time: 10/05/23  8:38 PM   Specimen: Anterior Nasal Swab  Result Value Ref Range Status   SARS Coronavirus 2 by RT PCR NEGATIVE NEGATIVE Final    Comment: (NOTE) SARS-CoV-2 target nucleic acids are NOT DETECTED.  The SARS-CoV-2 RNA is generally detectable in upper respiratory specimens during the acute phase of infection. The lowest concentration of SARS-CoV-2 viral copies this assay can detect is 138 copies/mL. A negative result does not preclude SARS-Cov-2 infection and should not be used as the sole basis for treatment or other patient management  decisions. A negative result may occur with  improper specimen collection/handling, submission of specimen other than nasopharyngeal swab, presence of viral mutation(s) within the areas targeted by this assay, and inadequate number of viral copies(<138 copies/mL). A negative result must be combined with clinical observations, patient history, and epidemiological information. The expected result is Negative.  Fact Sheet for Patients:  BloggerCourse.com  Fact Sheet for Healthcare Providers:  SeriousBroker.it  This test is no t yet approved or cleared by the [place]  FDA and  has been authorized for detection and/or diagnosis of SARS-CoV-2 by FDA under an Emergency Use Authorization (EUA). This EUA will remain  in effect (meaning this test can be used) for the duration of the COVID-19 declaration under Section 564(b)(1) of the Act, 21 U.S.C.section 360bbb-3(b)(1), unless the authorization is  terminated  or revoked sooner.       Influenza A by PCR NEGATIVE NEGATIVE Final   Influenza B by PCR NEGATIVE NEGATIVE Final    Comment: (NOTE) The Xpert Xpress SARS-CoV-2/FLU/RSV plus assay is intended as an aid in the diagnosis of influenza from Nasopharyngeal swab specimens and should not be used as a sole basis for treatment. Nasal washings and aspirates are unacceptable for Xpert Xpress SARS-CoV-2/FLU/RSV testing.  Fact Sheet for Patients: BloggerCourse.com  Fact Sheet for Healthcare Providers: SeriousBroker.it  This test is not yet approved or cleared by the [place]  FDA and has been authorized for detection and/or diagnosis of SARS-CoV-2 by FDA under an Emergency Use Authorization (EUA). This EUA will remain in effect (meaning this test can be used) for the duration of the COVID-19 declaration under Section 564(b)(1) of the Act, 21 U.S.C. section 360bbb-3(b)(1), unless the authorization is terminated or revoked.     Resp Syncytial Virus by PCR NEGATIVE NEGATIVE Final    Comment: (NOTE) Fact Sheet for Patients: BloggerCourse.com  Fact Sheet for Healthcare Providers: SeriousBroker.it  This test is not yet approved or cleared by the [place]  FDA and has been authorized for detection and/or diagnosis of SARS-CoV-2 by FDA under an Emergency Use Authorization (EUA). This EUA will remain in effect (meaning this test can be used) for the duration of the COVID-19 declaration under Section 564(b)(1) of the Act, 21 U.S.C. section 360bbb-3(b)(1), unless the authorization is terminated or revoked.  Performed at [facility], [address] [place], [place] [address]   MRSA Next Gen by PCR, Nasal     Status: None   Collection Time: 10/06/23  1:41 AM   Specimen: Nasal Mucosa; Nasal Swab  Result Value Ref Range Status   MRSA by PCR Next Gen NOT DETECTED NOT  DETECTED Final    Comment: (NOTE) The GeneXpert MRSA Assay (FDA approved for NASAL specimens only), is one component of a comprehensive MRSA colonization surveillance program. It is not intended to diagnose MRSA infection nor to guide or monitor treatment for MRSA infections. Test performance is not FDA approved in patients less than 36 years old. Performed at [facility], [address] [place], [place] [address]   Culture, blood (Routine X 2) w Reflex to ID Panel     Status: None (Preliminary result)   Collection Time: 10/06/23  5:03 AM   Specimen: BLOOD  Result Value Ref Range Status   Specimen Description BLOOD BLOOD RIGHT ARM  Final   Special Requests Blood Culture adequate volume  Final   Culture   Final    NO GROWTH 2 DAYS Performed at [facility], [address] [place], [place]  [address]    Report Status PENDING  Incomplete         Radiology Studies: No results found.       Scheduled Meds:  [MAR Hold] amLODipine   10 mg Oral Daily   [MAR Hold] carvedilol   25 mg Oral BID   [MAR Hold] Chlorhexidine  Gluconate Cloth  6 each Topical Daily   [MAR Hold] cloNIDine  0.2 mg Transdermal Weekly   [MAR Hold] heparin   5,000 Units Subcutaneous Q8H   [MAR Hold] hydrALAZINE   100 mg Oral TID   [MAR Hold] insulin  aspart  0-6 Units Subcutaneous Q4H   [MAR Hold] insulin  glargine-yfgn  10 Units Subcutaneous QHS   [MAR Hold] irbesartan  300 mg Oral QHS   [MAR Hold] mirtazapine   15 mg Oral QHS   [MAR Hold] pantoprazole  (PROTONIX ) IV  40 mg Intravenous Q0600   [MAR Hold] sertraline  50 mg Oral Daily   Continuous Infusions:  sodium chloride  10 mL/hr at 10/08/23 1454   [MAR Hold] promethazine  (PHENERGAN ) injection (IM or IVPB) 12.5 mg (10/07/23 1219)     LOS: 3 days     Raymonde Calico, MD [facility]   If 7PM-7AM, please contact night-coverage [email or web address] Password [facility] 10/08/2023, 4:32 PM

## 2023-10-08 NOTE — Progress Notes (Signed)
 Hemodialysis note  Received patient in bed to unit. Alert and oriented.  Informed consent signed and in chart.  Tx duration: 3.5 hours  Patient tolerated well. Transported back to room, alert without acute distress.  Report given to patient's RN.   Access used: Right Chest HD catheter Access issues: none  Total UF removed: 1L Medication(s) given:  Labetalol 20 mg UV, Zofran  4 mg IV  Post HD weight: 79.5 kg   Jackye Masson [facility]

## 2023-10-08 NOTE — Plan of Care (Addendum)
  Problem: Education: Goal: Ability to describe self-care measures that may prevent or decrease complications (Diabetes Survival Skills Education) will improve Outcome: Adequate for Discharge Goal: Individualized Educational Video(s) Outcome: Adequate for Discharge   Problem: Coping: Goal: Ability to adjust to condition or change in health will improve Outcome: Adequate for Discharge   Problem: Fluid Volume: Goal: Ability to maintain a balanced intake and output will improve Outcome: Adequate for Discharge   Problem: Health Behavior/Discharge Planning: Goal: Ability to identify and utilize available resources and services will improve Outcome: Adequate for Discharge Goal: Ability to manage health-related needs will improve Outcome: Adequate for Discharge   Problem: Metabolic: Goal: Ability to maintain appropriate glucose levels will improve Outcome: Adequate for Discharge   Problem: Metabolic: Goal: Ability to maintain appropriate glucose levels will improve Outcome: Adequate for Discharge   Problem: Nutritional: Goal: Maintenance of adequate nutrition will improve Outcome: Adequate for Discharge Goal: Progress toward achieving an optimal weight will improve Outcome: Adequate for Discharge  BP (!) 171/95   Pulse 88   Temp 98.7 F (37.1 C) (Oral)   Resp 18   Ht 5\' 11"  (1.803 m)   Wt 79.5 kg   SpO2 97%   BMI 24.44 kg/m   AVS reviewed, no questions, IV removed Tele removed. Pt d/c to home with family Darrell Walls 10/08/23 6:16 PM

## 2023-10-08 NOTE — Discharge Summary (Signed)
 Darrell Walls:[id number] DOB: Aug 07, 1990 DOA: 10/05/2023  PCP: Theda Finical, MD  Admit date: 10/05/2023 Discharge date: 10/08/2023  Time spent: 35 minutes  Recommendations for Outpatient Follow-up:  Pcp f/u     Discharge Diagnoses:  Principal Problem:   Ketoacidosis Active Problems:   SIRS (systemic inflammatory response syndrome) (HCC)   Abdominal pain   Intractable vomiting with nausea   Hypertensive urgency   ESRD on hemodialysis (HCC)   Pain in left arm   AV fistula thrombosis, subsequent encounter   Type 2 diabetes mellitus without complication [facility])   Discharge Condition: stable  Diet recommendation: carb modified heart healthy  Filed Weights   10/08/23 0828 10/08/23 1310 10/08/23 1440  Weight: 82.2 kg 79.5 kg 79.5 kg    History of present illness:  From admission h and p Darrell Walls is a 33 y.o. male with medical history significant for ESRD on HD MWF, HTN, OSA on CPAP, anemia of CKD, presenting with abdominal pain and intractable vomiting.  He denied diarrhea, fever, chest pain or shortness of breath.  Presented similarly back in 2023 with diagnosis of DKA with UTI.  Separately, patient complains of pain in his left arm, at the site of his AV fistula for which he was seen on 5/6 at the ED at [facility] that showed no evidence of obstruction in the central veins.     Hospital Course:   # DKA # T2DM Presenting with nausea/vomiting. No infectious source identified.  CXR clear, CT abdomen/pelvis nothing acute. Tolerating diet now, feeling much better, dka appears resolved. A1c is 6. - close pcp f/u  # Hypertensive urgency # HTN BPs much improved with resumption of home meds, dialysis, and resolution of nausea/vomiting   # Thrombosed AV fistula Fistulogram with thrombectomy and stent placement performed by vascular surgery 5/14. Has right chest catheter as well   # ESRD On mwf hemodialysis, maintained that here   # MDD - home sertraline,  mirtazapine   Procedures: See above   Consultations: Vascular surgery, nephrology  Discharge Exam: Vitals:   10/08/23 1637 10/08/23 1642  BP: (!) 179/114 (!) 170/104  Pulse: 85 83  Resp: 17 (!) 9  Temp:    SpO2: 100% 100%    General exam: Appears chronically ill Respiratory system: Clear to auscultation save for faint rales at bases Cardiovascular system: S1 & S2 heard, RRR.   Gastrointestinal system: Abdomen is nondistended, soft and nontender.   Central nervous system: Alert and oriented. Moving all 4 Extremities: Symmetric 5 x 5 power. Fistula left arm.   Skin: No rashes, lesions or ulcers. Right upper chest vascular catheter Psychiatry: Judgement and insight appear normal. Mood & affect appropriate.   Discharge Instructions   Discharge Instructions     Diet - low sodium heart healthy   Complete by: As directed    Increase activity slowly   Complete by: As directed       Allergies as of 10/08/2023   No Known Allergies      Medication List     TAKE these medications    albuterol 108 (90 Base) MCG/ACT inhaler Commonly known as: VENTOLIN HFA Inhale 2 puffs into the lungs every 6 (six) hours as needed for wheezing or shortness of breath.   amLODipine  10 MG tablet Commonly known as: NORVASC  Take 10 mg by mouth daily.   atorvastatin  20 MG tablet Commonly known as: LIPITOR Take 1 tablet by mouth daily.   Calcium  Acetate 667 MG Tabs Take 1,334 mg by mouth 3 (three)  times daily with meals.   carvedilol  25 MG tablet Commonly known as: COREG  Take 25 mg by mouth 2 (two) times daily.   cloNIDine 0.3 MG tablet Commonly known as: CATAPRES Take 0.3 mg by mouth 3 (three) times daily.   Dexcom G7 Receiver Devi by Does not apply route.   Dexcom G7 Sensor Misc by Does not apply route.   doxazosin 4 MG tablet Commonly known as: CARDURA Take 4 mg by mouth 2 (two) times daily.   EPINEPHrine 0.3 MG/0.3ML Sosy Inject 0.3 mg into the muscle once.    furosemide 80 MG tablet Commonly known as: LASIX Take 80 mg by mouth 2 (two) times daily.   gabapentin  100 MG capsule Commonly known as: NEURONTIN  Take 100 mg by mouth 3 (three) times a week. With dialysis M-W-F   hydrALAZINE  100 MG tablet Commonly known as: APRESOLINE  Take 100 mg by mouth 3 (three) times daily.   insulin  glargine 100 UNIT/ML injection Commonly known as: LANTUS  Inject 12 Units into the skin daily with supper.   insulin  lispro 100 UNIT/ML KwikPen Commonly known as: HUMALOG Inject 2-4 Units into the skin 3 (three) times daily with meals.   Insulin  Pen Needle 29G X Misc Please dispense 1 box of 100 insulin  needles and syringes   irbesartan 300 MG tablet Commonly known as: AVAPRO Take 300 mg by mouth at bedtime.   mirtazapine  15 MG tablet Commonly known as: REMERON  Take 15 mg by mouth at bedtime.   promethazine  12.5 MG tablet Commonly known as: PHENERGAN  Take 12.5 mg by mouth every 6 (six) hours as needed for nausea or vomiting.   sertraline 50 MG tablet Commonly known as: ZOLOFT Take 50 mg by mouth daily.   sodium bicarbonate 650 MG tablet Take 650 mg by mouth 2 (two) times daily.   traMADol  50 MG tablet Commonly known as: Ultram  Take 1 tablet (50 mg total) by mouth every 6 (six) hours as needed.   traZODone 50 MG tablet Commonly known as: DESYREL Take 50 mg by mouth at bedtime.       No Known Allergies  Follow-up Information     Theda Finical, MD Follow up.   Specialty: Family Medicine Contact information: [address] [place] [address] [phone]                  The results of significant diagnostics from this hospitalization (including imaging, microbiology, ancillary and laboratory) are listed below for reference.    Significant Diagnostic Studies: PERIPHERAL VASCULAR CATHETERIZATION Result Date: 10/08/2023 See surgical note for result.  US  Venous Img Upper Uni Left Result Date: 10/05/2023 CLINICAL  DATA:  left upper extremity pain and swelling, multiple prior fistula surgeries with stenosis, reportedly recent abscess in extremity, evaluting for clot vs cellulitis vs abscess EXAM: LEFT UPPER EXTREMITY VENOUS DOPPLER ULTRASOUND TECHNIQUE: Gray-scale sonography with graded compression, as well as color Doppler and duplex ultrasound were performed to evaluate the upper extremity deep venous system from the level of the subclavian vein and including the jugular, axillary, basilic, radial, ulnar and upper cephalic vein. Spectral Doppler was utilized to evaluate flow at rest and with distal augmentation maneuvers. COMPARISON:  Report from an ultrasound in an outside institution five days ago, images not available. FINDINGS: Contralateral Subclavian Vein: Respiratory phasicity is normal and symmetric with the symptomatic side. No evidence of thrombus. Normal compressibility. Internal Jugular Vein: No evidence of thrombus. Normal compressibility, respiratory phasicity and response to augmentation. Subclavian Vein: No evidence of thrombus.  Normal compressibility, respiratory phasicity and response to augmentation. Axillary Vein: No evidence of thrombus. Normal compressibility, respiratory phasicity and response to augmentation. Cephalic Vein: The cephalic AV fistula described on prior exam is not evaluated. Flow is demonstrated in the cephalic vein. No fluid collection is seen on the current exam Basilic Vein: No evidence of thrombus. Normal compressibility, respiratory phasicity and response to augmentation. Brachial Veins: No evidence of thrombus. Normal compressibility, respiratory phasicity and response to augmentation. Radial Veins: No evidence of thrombus. Normal compressibility, respiratory phasicity and response to augmentation. Ulnar Veins: Fistula at the wrist which appears thrombosed. Other Findings:  No evidence of focal fluid collection. IMPRESSION: 1. 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 2. The cephalic AV  fistula described on prior outside exam with associated collection is not seen on provided images. Vascular surgery follow-up is recommended. 3. No evidence of left upper extremity DVT. Electronically Signed   By: Chadwick Colonel M.D.   On: 10/05/2023 21:39   CT ABDOMEN PELVIS WO CONTRAST Result Date: 10/05/2023 CLINICAL DATA:  Black emesis. EXAM: CT ABDOMEN AND PELVIS WITHOUT CONTRAST TECHNIQUE: Multidetector CT imaging of the abdomen and pelvis was performed following the standard protocol without IV contrast. RADIATION DOSE REDUCTION: This exam was performed according to the departmental dose-optimization program which includes automated exposure control, adjustment of the mA and/or kV according to patient size and/or use of iterative reconstruction technique. COMPARISON:  May 15, 2022 FINDINGS: Lower chest: A 12 mm thick anterior pericardial effusion is noted. Hepatobiliary: No focal liver abnormality is seen. No gallstones, gallbladder wall thickening, or biliary dilatation. Pancreas: Unremarkable. No pancreatic ductal dilatation or surrounding inflammatory changes. Spleen: Normal in size without focal abnormality. Adrenals/Urinary Tract: Adrenal glands are unremarkable. Kidneys are normal, without renal calculi, focal lesion, or hydronephrosis. Urinary bladder is poorly distended. Mild diffuse urinary bladder wall thickening is seen. Stomach/Bowel: Stomach is within normal limits. Appendix appears normal. Stool is seen throughout the large bowel. No evidence of bowel wall thickening, distention, or inflammatory changes. Vascular/Lymphatic: Aortic atherosclerosis with diffuse calcification of the arterial structures within the abdomen and pelvis. No enlarged abdominal or pelvic lymph nodes. Reproductive: Prostate is unremarkable. Other: No abdominal wall hernia or abnormality. No abdominopelvic ascites. Musculoskeletal: No acute or significant osseous findings. IMPRESSION: 1. Small anterior pericardial  effusion. 2. Aortic atherosclerosis. Electronically Signed   By: Virgle Grime M.D.   On: 10/05/2023 20:34   DG Chest Port 1 View Result Date: 10/05/2023 CLINICAL DATA:  Suspected sepsis.  Vomiting. EXAM: PORTABLE CHEST 1 VIEW COMPARISON:  Radiograph 04/27/2019 FINDINGS: Right-sided dialysis catheter tip in the right atrium.The cardiomediastinal contours are normal. The lungs are clear. Pulmonary vasculature is normal. No consolidation, pleural effusion, or pneumothorax. No acute osseous abnormalities are seen. IMPRESSION: No active disease. Electronically Signed   By: Chadwick Colonel M.D.   On: 10/05/2023 19:43   PERIPHERAL VASCULAR CATHETERIZATION Result Date: 09/11/2023 See surgical note for result.   Microbiology: Recent Results (from the past 240 hours)  Culture, blood (Routine x 2)     Status: None (Preliminary result)   Collection Time: 10/05/23  5:28 PM   Specimen: BLOOD RIGHT ARM  Result Value Ref Range Status   Specimen Description BLOOD RIGHT ARM  Final   Special Requests   Final    BOTTLES DRAWN AEROBIC AND ANAEROBIC Blood Culture adequate volume   Culture   Final    NO GROWTH 3 DAYS Performed at [facility], [address], [place], [place] [address]  Report Status PENDING  Incomplete  Resp panel by RT-PCR (RSV, Flu A&B, Covid) Anterior Nasal Swab     Status: None   Collection Time: 10/05/23  8:38 PM   Specimen: Anterior Nasal Swab  Result Value Ref Range Status   SARS Coronavirus 2 by RT PCR NEGATIVE NEGATIVE Final    Comment: (NOTE) SARS-CoV-2 target nucleic acids are NOT DETECTED.  The SARS-CoV-2 RNA is generally detectable in upper respiratory specimens during the acute phase of infection. The lowest concentration of SARS-CoV-2 viral copies this assay can detect is 138 copies/mL. A negative result does not preclude SARS-Cov-2 infection and should not be used as the sole basis for treatment or other patient management decisions. A negative result  may occur with  improper specimen collection/handling, submission of specimen other than nasopharyngeal swab, presence of viral mutation(s) within the areas targeted by this assay, and inadequate number of viral copies(<138 copies/mL). A negative result must be combined with clinical observations, patient history, and epidemiological information. The expected result is Negative.  Fact Sheet for Patients:  BloggerCourse.com  Fact Sheet for Healthcare Providers:  SeriousBroker.it  This test is no t yet approved or cleared by the [place]  FDA and  has been authorized for detection and/or diagnosis of SARS-CoV-2 by FDA under an Emergency Use Authorization (EUA). This EUA will remain  in effect (meaning this test can be used) for the duration of the COVID-19 declaration under Section 564(b)(1) of the Act, 21 U.S.C.section 360bbb-3(b)(1), unless the authorization is terminated  or revoked sooner.       Influenza A by PCR NEGATIVE NEGATIVE Final   Influenza B by PCR NEGATIVE NEGATIVE Final    Comment: (NOTE) The Xpert Xpress SARS-CoV-2/FLU/RSV plus assay is intended as an aid in the diagnosis of influenza from Nasopharyngeal swab specimens and should not be used as a sole basis for treatment. Nasal washings and aspirates are unacceptable for Xpert Xpress SARS-CoV-2/FLU/RSV testing.  Fact Sheet for Patients: BloggerCourse.com  Fact Sheet for Healthcare Providers: SeriousBroker.it  This test is not yet approved or cleared by the [place]  FDA and has been authorized for detection and/or diagnosis of SARS-CoV-2 by FDA under an Emergency Use Authorization (EUA). This EUA will remain in effect (meaning this test can be used) for the duration of the COVID-19 declaration under Section 564(b)(1) of the Act, 21 U.S.C. section 360bbb-3(b)(1), unless the authorization is terminated  or revoked.     Resp Syncytial Virus by PCR NEGATIVE NEGATIVE Final    Comment: (NOTE) Fact Sheet for Patients: BloggerCourse.com  Fact Sheet for Healthcare Providers: SeriousBroker.it  This test is not yet approved or cleared by the [place]  FDA and has been authorized for detection and/or diagnosis of SARS-CoV-2 by FDA under an Emergency Use Authorization (EUA). This EUA will remain in effect (meaning this test can be used) for the duration of the COVID-19 declaration under Section 564(b)(1) of the Act, 21 U.S.C. section 360bbb-3(b)(1), unless the authorization is terminated or revoked.  Performed at [facility], [address] [place], [place] [address]   MRSA Next Gen by PCR, Nasal     Status: None   Collection Time: 10/06/23  1:41 AM   Specimen: Nasal Mucosa; Nasal Swab  Result Value Ref Range Status   MRSA by PCR Next Gen NOT DETECTED NOT DETECTED Final    Comment: (NOTE) The GeneXpert MRSA Assay (FDA approved for NASAL specimens only), is one component of a comprehensive MRSA colonization surveillance program. It is not intended  to diagnose MRSA infection nor to guide or monitor treatment for MRSA infections. Test performance is not FDA approved in patients less than 64 years old. Performed at [facility], [address] [place], [place] [address]   Culture, blood (Routine X 2) w Reflex to ID Panel     Status: None (Preliminary result)   Collection Time: 10/06/23  5:03 AM   Specimen: BLOOD  Result Value Ref Range Status   Specimen Description BLOOD BLOOD RIGHT ARM  Final   Special Requests Blood Culture adequate volume  Final   Culture   Final    NO GROWTH 2 DAYS Performed at [facility], [address] [place], [place] [address]    Report Status PENDING  Incomplete     Labs: Basic Metabolic Panel: Recent Labs  Lab 10/06/23 0832 10/06/23 1507 10/06/23 2150  10/07/23 0552 10/08/23 0621  NA 140 136 134* 138 136  K 4.1 3.8 3.6 3.5 3.7  CL 100 97* 96* 97* 99  CO2 22 19* 21* 23 20*  GLUCOSE 137* 152* 122* 128* 104*  BUN 59* 59* 65* 61* 73*  CREATININE 11.41* 12.12* 13.18* 11.83* 14.76*  CALCIUM  9.2 9.0 8.9 9.1 7.7*   Liver Function Tests: Recent Labs  Lab 10/05/23 1728  AST 24  ALT 21  ALKPHOS 132*  BILITOT 1.7*  PROT 9.4*  ALBUMIN 4.8   Recent Labs  Lab 10/05/23 1728  LIPASE 24   No results for input(s): "AMMONIA" in the last 168 hours. CBC: Recent Labs  Lab 10/05/23 1728 10/08/23 0852  WBC 8.4 10.7*  HGB 12.7* 10.8*  HCT 38.1* 32.3*  MCV 89.0 90.0  PLT 360 266   Cardiac Enzymes: No results for input(s): "CKTOTAL", "CKMB", "CKMBINDEX", "TROPONINI" in the last 168 hours. BNP: BNP (last 3 results) No results for input(s): "BNP" in the last 8760 hours.  ProBNP (last 3 results) No results for input(s): "PROBNP" in the last 8760 hours.  CBG: Recent Labs  Lab 10/07/23 2111 10/07/23 2319 10/08/23 0505 10/08/23 1332 10/08/23 1651  GLUCAP 125* 133* 103* 81 82       Signed:  Raymonde Calico MD.  [facility] 10/08/2023, 5:04 PM

## 2023-10-09 ENCOUNTER — Encounter: Payer: Self-pay | Admitting: Vascular Surgery

## 2023-10-09 SURGERY — A/V FISTULAGRAM
Anesthesia: Moderate Sedation | Laterality: Left

## 2023-10-10 LAB — CULTURE, BLOOD (ROUTINE X 2)
Culture: NO GROWTH
Special Requests: ADEQUATE

## 2023-10-11 LAB — CULTURE, BLOOD (ROUTINE X 2)
Culture: NO GROWTH
Special Requests: ADEQUATE

## 2023-10-12 ENCOUNTER — Inpatient Hospital Stay
Admission: EM | Admit: 2023-10-12 | Discharge: 2023-10-14 | DRG: 314 | Disposition: A | Discharge status: Home / Self Care | Attending: Internal Medicine | Admitting: Internal Medicine

## 2023-10-12 ENCOUNTER — Other Ambulatory Visit: Payer: Self-pay

## 2023-10-12 ENCOUNTER — Inpatient Hospital Stay

## 2023-10-12 ENCOUNTER — Encounter: Payer: Self-pay | Admitting: Emergency Medicine

## 2023-10-12 DIAGNOSIS — Z992 Dependence on renal dialysis: Secondary | ICD-10-CM | POA: Diagnosis not present

## 2023-10-12 DIAGNOSIS — T827XXA Infection and inflammatory reaction due to other cardiac and vascular devices, implants and grafts, initial encounter: Secondary | ICD-10-CM | POA: Diagnosis present

## 2023-10-12 DIAGNOSIS — T82590S Other mechanical complication of surgically created arteriovenous fistula, sequela: Secondary | ICD-10-CM

## 2023-10-12 DIAGNOSIS — E1122 Type 2 diabetes mellitus with diabetic chronic kidney disease: Secondary | ICD-10-CM | POA: Diagnosis present

## 2023-10-12 DIAGNOSIS — N186 End stage renal disease: Secondary | ICD-10-CM | POA: Diagnosis present

## 2023-10-12 DIAGNOSIS — Z7682 Awaiting organ transplant status: Secondary | ICD-10-CM | POA: Diagnosis not present

## 2023-10-12 DIAGNOSIS — T82510A Breakdown (mechanical) of surgically created arteriovenous fistula, initial encounter: Secondary | ICD-10-CM | POA: Diagnosis present

## 2023-10-12 DIAGNOSIS — Y832 Surgical operation with anastomosis, bypass or graft as the cause of abnormal reaction of the patient, or of later complication, without mention of misadventure at the time of the procedure: Secondary | ICD-10-CM | POA: Diagnosis present

## 2023-10-12 DIAGNOSIS — Z794 Long term (current) use of insulin: Secondary | ICD-10-CM | POA: Diagnosis not present

## 2023-10-12 DIAGNOSIS — A419 Sepsis, unspecified organism: Secondary | ICD-10-CM | POA: Diagnosis present

## 2023-10-12 DIAGNOSIS — I160 Hypertensive urgency: Secondary | ICD-10-CM | POA: Diagnosis present

## 2023-10-12 DIAGNOSIS — E785 Hyperlipidemia, unspecified: Secondary | ICD-10-CM | POA: Diagnosis present

## 2023-10-12 DIAGNOSIS — Y712 Prosthetic and other implants, materials and accessory cardiovascular devices associated with adverse incidents: Secondary | ICD-10-CM | POA: Diagnosis present

## 2023-10-12 DIAGNOSIS — G4733 Obstructive sleep apnea (adult) (pediatric): Secondary | ICD-10-CM | POA: Diagnosis present

## 2023-10-12 DIAGNOSIS — N189 Chronic kidney disease, unspecified: Secondary | ICD-10-CM | POA: Diagnosis present

## 2023-10-12 DIAGNOSIS — Z87891 Personal history of nicotine dependence: Secondary | ICD-10-CM

## 2023-10-12 DIAGNOSIS — I120 Hypertensive chronic kidney disease with stage 5 chronic kidney disease or end stage renal disease: Secondary | ICD-10-CM | POA: Diagnosis present

## 2023-10-12 DIAGNOSIS — E1151 Type 2 diabetes mellitus with diabetic peripheral angiopathy without gangrene: Secondary | ICD-10-CM | POA: Diagnosis present

## 2023-10-12 DIAGNOSIS — I10 Essential (primary) hypertension: Secondary | ICD-10-CM | POA: Insufficient documentation

## 2023-10-12 DIAGNOSIS — E11649 Type 2 diabetes mellitus with hypoglycemia without coma: Secondary | ICD-10-CM | POA: Diagnosis not present

## 2023-10-12 DIAGNOSIS — D631 Anemia in chronic kidney disease: Secondary | ICD-10-CM | POA: Diagnosis present

## 2023-10-12 DIAGNOSIS — F32A Depression, unspecified: Secondary | ICD-10-CM | POA: Diagnosis present

## 2023-10-12 DIAGNOSIS — T8140XA Infection following a procedure, unspecified, initial encounter: Principal | ICD-10-CM

## 2023-10-12 DIAGNOSIS — Z79899 Other long term (current) drug therapy: Secondary | ICD-10-CM

## 2023-10-12 DIAGNOSIS — E119 Type 2 diabetes mellitus without complications: Secondary | ICD-10-CM

## 2023-10-12 LAB — LACTIC ACID, PLASMA
Lactic Acid, Venous: 0.9 mmol/L (ref 0.5–1.9)
Lactic Acid, Venous: 0.6 mmol/L (ref 0.5–1.9)

## 2023-10-12 LAB — COMPREHENSIVE METABOLIC PANEL WITH GFR
ALT: 17 U/L (ref 0–44)
AST: 27 U/L (ref 15–41)
Albumin: 3.9 g/dL (ref 3.5–5.0)
Alkaline Phosphatase: 110 U/L (ref 38–126)
Anion gap: 16 — ABNORMAL HIGH (ref 5–15)
BUN: 54 mg/dL — ABNORMAL HIGH (ref 6–20)
CO2: 22 mmol/L (ref 22–32)
Calcium: 7.8 mg/dL — ABNORMAL LOW (ref 8.9–10.3)
Chloride: 99 mmol/L (ref 98–111)
Creatinine, Ser: 9.8 mg/dL — ABNORMAL HIGH (ref 0.61–1.24)
GFR, Estimated: 7 mL/min — ABNORMAL LOW (ref >60)
Glucose, Bld: 152 mg/dL — ABNORMAL HIGH (ref 70–99)
Potassium: 3.9 mmol/L (ref 3.5–5.1)
Sodium: 137 mmol/L (ref 135–145)
Total Bilirubin: 0.7 mg/dL (ref 0.0–1.2)
Total Protein: 7.6 g/dL (ref 6.5–8.1)

## 2023-10-12 LAB — GLUCOSE, CAPILLARY
Glucose-Capillary: 162 mg/dL — ABNORMAL HIGH (ref 70–99)
Glucose-Capillary: 158 mg/dL — ABNORMAL HIGH (ref 70–99)
Glucose-Capillary: 94 mg/dL (ref 70–99)
Glucose-Capillary: 81 mg/dL (ref 70–99)

## 2023-10-12 LAB — CBC WITH DIFFERENTIAL/PLATELET
Abs Immature Granulocytes: 0.03 10*3/uL (ref 0.00–0.07)
Basophils Absolute: 0 10*3/uL (ref 0.0–0.1)
Basophils Relative: 0 %
Eosinophils Absolute: 0.9 10*3/uL — ABNORMAL HIGH (ref 0.0–0.5)
Eosinophils Relative: 8 %
HCT: 29.4 % — ABNORMAL LOW (ref 39.0–52.0)
Hemoglobin: 9.5 g/dL — ABNORMAL LOW (ref 13.0–17.0)
Immature Granulocytes: 0 %
Lymphocytes Relative: 15 %
Lymphs Abs: 1.7 10*3/uL (ref 0.7–4.0)
MCH: 30.1 pg (ref 26.0–34.0)
MCHC: 32.3 g/dL (ref 30.0–36.0)
MCV: 93 fL (ref 80.0–100.0)
Monocytes Absolute: 1.2 10*3/uL — ABNORMAL HIGH (ref 0.1–1.0)
Monocytes Relative: 11 %
Neutro Abs: 7.2 10*3/uL (ref 1.7–7.7)
Neutrophils Relative %: 66 %
Platelets: 225 10*3/uL (ref 150–400)
RBC: 3.16 MIL/uL — ABNORMAL LOW (ref 4.22–5.81)
RDW: 14.9 % (ref 11.5–15.5)
WBC: 11 10*3/uL — ABNORMAL HIGH (ref 4.0–10.5)
nRBC: 0 % (ref 0.0–0.2)

## 2023-10-12 MED ORDER — VANCOMYCIN HCL 750 MG/150ML IV SOLN
750.0000 mg | INTRAVENOUS | Status: DC
  Administered 2023-10-13: 750 mg via INTRAVENOUS
  Filled 2023-10-12: qty 150

## 2023-10-12 MED ORDER — CHLORHEXIDINE GLUCONATE CLOTH 2 % EX PADS
6.0000 | MEDICATED_PAD | Freq: Every day | CUTANEOUS | Status: DC
  Administered 2023-10-13: 6 via TOPICAL

## 2023-10-12 MED ORDER — MORPHINE SULFATE (PF) 2 MG/ML IV SOLN
2.0000 mg | INTRAVENOUS | Status: DC | PRN
  Administered 2023-10-12: 2 mg via INTRAVENOUS
  Filled 2023-10-12: qty 1

## 2023-10-12 MED ORDER — ONDANSETRON HCL 4 MG/2ML IJ SOLN: 4.0000 mg | Freq: Four times a day (QID) | INTRAMUSCULAR | Status: DC | PRN

## 2023-10-12 MED ORDER — INSULIN GLARGINE-YFGN 100 UNIT/ML ~~LOC~~ SOLN
6.0000 [IU] | Freq: Every day | SUBCUTANEOUS | Status: DC
  Administered 2023-10-12 – 2023-10-13 (×2): 6 [IU] via SUBCUTANEOUS
  Filled 2023-10-12 (×2): qty 0.06

## 2023-10-12 MED ORDER — CLONIDINE HCL 0.1 MG PO TABS
0.3000 mg | ORAL_TABLET | Freq: Three times a day (TID) | ORAL | Status: DC
  Administered 2023-10-12 – 2023-10-14 (×7): 0.3 mg via ORAL
  Filled 2023-10-12 (×8): qty 3

## 2023-10-12 MED ORDER — SERTRALINE HCL 50 MG PO TABS
50.0000 mg | ORAL_TABLET | Freq: Every day | ORAL | Status: DC
  Administered 2023-10-12 – 2023-10-14 (×3): 50 mg via ORAL
  Filled 2023-10-12 (×3): qty 1

## 2023-10-12 MED ORDER — VANCOMYCIN HCL IN DEXTROSE 1-5 GM/200ML-% IV SOLN
1000.0000 mg | Freq: Once | INTRAVENOUS | Status: AC
  Administered 2023-10-12: 1000 mg via INTRAVENOUS
  Filled 2023-10-12: qty 200

## 2023-10-12 MED ORDER — ONDANSETRON HCL 4 MG/2ML IJ SOLN
4.0000 mg | Freq: Once | INTRAMUSCULAR | Status: AC
  Administered 2023-10-12: 4 mg via INTRAVENOUS
  Filled 2023-10-12: qty 2

## 2023-10-12 MED ORDER — ACETAMINOPHEN 325 MG PO TABS: 650.0000 mg | ORAL_TABLET | Freq: Four times a day (QID) | ORAL | Status: DC | PRN

## 2023-10-12 MED ORDER — OXYCODONE HCL 5 MG PO TABS
5.0000 mg | ORAL_TABLET | ORAL | Status: DC | PRN
  Administered 2023-10-12 – 2023-10-13 (×3): 5 mg via ORAL
  Filled 2023-10-12 (×4): qty 1

## 2023-10-12 MED ORDER — IRBESARTAN 150 MG PO TABS
300.0000 mg | ORAL_TABLET | Freq: Every day | ORAL | Status: DC
  Administered 2023-10-12 – 2023-10-14 (×3): 300 mg via ORAL
  Filled 2023-10-12 (×3): qty 2

## 2023-10-12 MED ORDER — MORPHINE SULFATE (PF) 4 MG/ML IV SOLN
4.0000 mg | Freq: Once | INTRAVENOUS | Status: AC
  Administered 2023-10-12: 4 mg via INTRAVENOUS
  Filled 2023-10-12: qty 1

## 2023-10-12 MED ORDER — INSULIN ASPART 100 UNIT/ML IJ SOLN
0.0000 [IU] | Freq: Three times a day (TID) | INTRAMUSCULAR | Status: DC
  Administered 2023-10-12 (×3): 1 [IU] via SUBCUTANEOUS
  Filled 2023-10-12 (×4): qty 1

## 2023-10-12 MED ORDER — SODIUM CHLORIDE 0.9 % IV SOLN
1.0000 g | Freq: Every day | INTRAVENOUS | Status: DC
  Administered 2023-10-12 – 2023-10-14 (×3): 1 g via INTRAVENOUS
  Filled 2023-10-12 (×3): qty 10

## 2023-10-12 MED ORDER — ATORVASTATIN CALCIUM 20 MG PO TABS
20.0000 mg | ORAL_TABLET | Freq: Every day | ORAL | Status: DC
Start: 2023-10-12 — End: 2023-10-14
  Administered 2023-10-12 – 2023-10-14 (×3): 20 mg via ORAL
  Filled 2023-10-12 (×3): qty 1

## 2023-10-12 MED ORDER — CARVEDILOL 25 MG PO TABS
25.0000 mg | ORAL_TABLET | Freq: Two times a day (BID) | ORAL | Status: DC
  Administered 2023-10-12 – 2023-10-14 (×4): 25 mg via ORAL
  Filled 2023-10-12 (×4): qty 1

## 2023-10-12 MED ORDER — HEPARIN SODIUM (PORCINE) 5000 UNIT/ML IJ SOLN
5000.0000 [IU] | Freq: Three times a day (TID) | INTRAMUSCULAR | Status: DC
  Filled 2023-10-12 (×2): qty 1

## 2023-10-12 MED ORDER — FUROSEMIDE 40 MG PO TABS
80.0000 mg | ORAL_TABLET | Freq: Two times a day (BID) | ORAL | Status: DC
  Administered 2023-10-12 – 2023-10-14 (×5): 80 mg via ORAL
  Filled 2023-10-12 (×5): qty 2

## 2023-10-12 MED ORDER — ONDANSETRON HCL 4 MG PO TABS: 4.0000 mg | ORAL_TABLET | Freq: Four times a day (QID) | ORAL | Status: DC | PRN

## 2023-10-12 MED ORDER — INSULIN ASPART 100 UNIT/ML IJ SOLN: 0.0000 [IU] | Freq: Every day | INTRAMUSCULAR | Status: DC

## 2023-10-12 MED ORDER — ACETAMINOPHEN 650 MG RE SUPP
650.0000 mg | Freq: Four times a day (QID) | RECTAL | Status: DC | PRN
Start: 2023-10-12 — End: 2023-10-14

## 2023-10-12 MED ORDER — OXYCODONE-ACETAMINOPHEN 5-325 MG PO TABS: 2.0000 | ORAL_TABLET | Freq: Once | ORAL | Status: DC

## 2023-10-12 NOTE — Plan of Care (Signed)
   Problem: Education: Goal: Ability to describe self-care measures that may prevent or decrease complications (Diabetes Survival Skills Education) will improve Outcome: Progressing Goal: Individualized Educational Video(s) Outcome: Progressing   Problem: Coping: Goal: Ability to adjust to condition or change in health will improve Outcome: Progressing

## 2023-10-12 NOTE — Assessment & Plan Note (Signed)
Nephrology consult for continuation of dialysis 

## 2023-10-12 NOTE — Plan of Care (Signed)

## 2023-10-12 NOTE — Assessment & Plan Note (Signed)
 Continue home meds pending verification

## 2023-10-12 NOTE — ED Triage Notes (Signed)
 Pt in with warmth, redness and swelling to L forearm - had fistula placed on 5/14. Last dialysis Friday, usually MWF

## 2023-10-12 NOTE — Progress Notes (Signed)
 Referring Provider: No ref. provider found Primary Care Physician:  Theda Finical, MD Primary Nephrologist:  Dr.   Lea Primmer for Consultation: ESRD  HPI: 33 year old male with history of diabetes, peripheral vascular disease, hypertension, hyperlipidemia, end-stage renal disease on hemodialysis via right internal jugular permacath.  He has been on a Monday Wednesday Friday schedule for dialysis.  Patient recently had thrombectomy with angioplasty and stenting of his AV fistula on the left arm.  Patient developed pain around the area of the AV fistula and was admitted to rule out sepsis.  Past Medical History:  Diagnosis Date   Adjustment disorder with mixed anxiety and depressed mood    Anemia due to chronic kidney disease    Asthma    Cannabis use disorder    Diabetes mellitus type 2, insulin  dependent (HCC)    DKA (diabetic ketoacidosis) (HCC)    End stage renal disease on dialysis (HCC)    Hyperlipidemia    Hypertension associated with type 2 diabetes mellitus (HCC)    Intractable vomiting with nausea 10/06/2023   Obstructive sleep apnea     Past Surgical History:  Procedure Laterality Date   A/V FISTULAGRAM Left 09/11/2023   Procedure: A/V Fistulagram;  Surgeon: Celso College, MD;  Location: [facility]IVE [facility];  Service: Cardiovascular;  Laterality: Left;   A/V FISTULAGRAM Left 10/08/2023   Procedure: A/V FISTULAGRAM;  Surgeon: Celso College, MD;  Location: [facility] INV[facility] CV [facility];  Service: Cardiovascular;  Laterality: Left;   AV FISTULA PLACEMENT Left 07/17/2023   Procedure: ARTERIOVENOUS (AV) FISTULA CREATION (RADIALCEPHALIC);  Surgeon: Celso College, MD;  Location: [facility] ORS;  Service: Vascular;  Laterality: Left;   CAPD REMOVAL N/A 07/17/2023   Procedure: CONTINUOUS AMBULATORY PERITONEAL DIALYSIS  (CAPD) CATHETER REMOVAL;  Surgeon: Celso College, MD;  Location: [facility] ORS;  Service: Vascular;  Laterality: N/A;   DIALYSIS/PERMA CATHETER INSERTION  10/2022   PARS PLANA VITRECTOMY W/  ENDOLASER PANRETINAL PHOTOCOAGULATION Right 06/29/2018   PERITONEAL CATHETER INSERTION  03/04/2023   TONSILLECTOMY AND ADENOIDECTOMY  2002    Prior to Admission medications   Medication Sig Start Date End Date Taking? Authorizing Provider  albuterol (VENTOLIN HFA) 108 (90 Base) MCG/ACT inhaler Inhale 2 puffs into the lungs every 6 (six) hours as needed for wheezing or shortness of breath. Patient not taking: Reported on 09/01/2023    [provider]  amLODipine  (NORVASC ) 10 MG tablet Take 10 mg by mouth daily.    [provider]  atorvastatin  (LIPITOR) 20 MG tablet Take 1 tablet by mouth daily. 04/16/22 08/27/24  [provider]  Calcium  Acetate 667 MG TABS Take 1,334 mg by mouth 3 (three) times daily with meals.    [provider]  carvedilol  (COREG ) 25 MG tablet Take 25 mg by mouth 2 (two) times daily. 03/18/22   [provider]  cloNIDine  (CATAPRES ) 0.3 MG tablet Take 0.3 mg by mouth 3 (three) times daily. 09/06/23   [provider]  Continuous Glucose Receiver (DEXCOM G7 RECEIVER) DEVI by Does not apply route.    [provider]  Continuous Glucose Sensor (DEXCOM G7 SENSOR) MISC by Does not apply route.    [provider]  doxazosin  (CARDURA ) 4 MG tablet Take 4 mg by mouth 2 (two) times daily.    [provider]  EPINEPHrine 0.3 MG/0.3ML SOSY Inject 0.3 mg into the muscle once.    [provider]  furosemide (LASIX) 80 MG tablet Take 80 mg by mouth 2 (two) times daily.  10/02/23   [provider]  gabapentin  (NEURONTIN ) 100 MG capsule Take 100 mg by mouth 3 (three) times a week. With dialysis M-W-F    [provider]  hydrALAZINE  (APRESOLINE ) 100 MG tablet Take 100 mg by mouth 3 (three) times daily.    [provider]  insulin  glargine (LANTUS ) 100 UNIT/ML injection Inject 12 Units into the skin daily with supper.    [provider]  insulin  lispro (HUMALOG) 100 UNIT/ML  KwikPen Inject 2-4 Units into the skin 3 (three) times daily with meals. 03/19/22   [provider]  Insulin  Pen Needle 29G X MISC Please dispense 1 box of 100 insulin  needles and syringes 09/23/19   Bryson Carbine, MD  irbesartan  (AVAPRO ) 300 MG tablet Take 300 mg by mouth at bedtime.    [provider]  mirtazapine  (REMERON ) 15 MG tablet Take 15 mg by mouth at bedtime.    [provider]  promethazine  (PHENERGAN ) 12.5 MG tablet Take 12.5 mg by mouth every 6 (six) hours as needed for nausea or vomiting.    [provider]  sertraline  (ZOLOFT ) 50 MG tablet Take 50 mg by mouth daily.    [provider]  sodium bicarbonate 650 MG tablet Take 650 mg by mouth 2 (two) times daily.    [provider]  traMADol  (ULTRAM ) 50 MG tablet Take 1 tablet (50 mg total) by mouth every 6 (six) hours as needed. Patient not taking: Reported on 10/07/2023 07/17/23 07/16/24  Celso College, MD  traZODone  (DESYREL ) 50 MG tablet Take 50 mg by mouth at bedtime.    [provider]    Current Facility-Administered Medications  Medication Dose Route Frequency Provider Last Rate Last Admin   acetaminophen  (TYLENOL ) tablet 650 mg  650 mg Oral Q6H PRN Duncan, Hazel V, MD       Or   acetaminophen  (TYLENOL ) suppository 650 mg  650 mg Rectal Q6H PRN Lanetta Pion, MD       atorvastatin  (LIPITOR) tablet 20 mg  20 mg Oral Daily Duncan, Hazel V, MD   20 mg at 10/12/23 0914   carvedilol  (COREG ) tablet 25 mg  25 mg Oral BID WC Alphonsus Jeans, MD       cefTRIAXone  (ROCEPHIN ) 1 g in sodium chloride  0.9 % 100 mL IVPB  1 g Intravenous Q0600 Brion Cancel V, MD 200 mL/hr at 10/12/23 0329 1 g at 10/12/23 0329   [START ON 10/13/2023] Chlorhexidine  Gluconate Cloth 2 % PADS 6 each  6 each Topical Q0600 Madilynne Mullan, MD       cloNIDine  (CATAPRES ) tablet 0.3 mg  0.3 mg Oral TID Duncan, Hazel V, MD   0.3 mg at 10/12/23 0914   furosemide (LASIX) tablet 80 mg  80 mg Oral BID  Duncan, Hazel V, MD   80 mg at 10/12/23 1610   heparin  injection 5,000 Units  5,000 Units Subcutaneous Q8H Lanetta Pion, MD       insulin  aspart (novoLOG ) injection 0-5 Units  0-5 Units Subcutaneous QHS Duncan, Hazel V, MD       insulin  aspart (novoLOG ) injection 0-6 Units  0-6 Units Subcutaneous TID WC Duncan, Hazel V, MD   1 Units at 10/12/23 1207   insulin  glargine-yfgn (SEMGLEE ) injection 6 Units  6 Units Subcutaneous QHS Lanetta Pion, MD       irbesartan  (AVAPRO ) tablet 300 mg  300 mg Oral Daily Alphonsus Jeans, MD   300 mg at 10/12/23 1340  morphine  (PF) 2 MG/ML injection 2 mg  2 mg Intravenous Q2H PRN Duncan, Hazel V, MD   2 mg at 10/12/23 0910   ondansetron  (ZOFRAN ) tablet 4 mg  4 mg Oral Q6H PRN Lanetta Pion, MD       Or   ondansetron  (ZOFRAN ) injection 4 mg  4 mg Intravenous Q6H PRN Lanetta Pion, MD       oxyCODONE  (Oxy IR/ROXICODONE ) immediate release tablet 5 mg  5 mg Oral Q4H PRN Lanetta Pion, MD   5 mg at 10/12/23 9604   sertraline  (ZOLOFT ) tablet 50 mg  50 mg Oral Daily Alphonsus Jeans, MD   50 mg at 10/12/23 1337   [START ON 10/13/2023] vancomycin  (VANCOREADY) IVPB 750 mg/150 mL  750 mg Intravenous Q M,W,F-HD Coretta Dexter, RPH        Allergies as of 10/12/2023   (No Known Allergies)    Family History  Problem Relation Age of Onset   Healthy Mother     Social History   Socioeconomic History   Marital status: Single    Spouse name: Not on file   Number of children: 0   Years of education: Not on file   Highest education level: Not on file  Occupational History   Not on file  Tobacco Use   Smoking status: Former    Current packs/day: 0.00    Types: Cigarettes    Quit date: 05/2023    Years since quitting: 0.3   Smokeless tobacco: Never  Vaping Use   Vaping status: Never Used  Substance and Sexual Activity   Alcohol use: Not Currently   Drug use: Yes    Types: Marijuana    Comment: about a month ago   Sexual activity: Not on file   Other Topics Concern   Not on file  Social History Narrative   Lives with mother   Social Drivers of Health   Financial Resource Strain: Low Risk  (12/19/2022)   Received from [facility]   Overall Financial Resource Strain (CARDIA)    Difficulty of Paying Living Expenses: Not hard at all  Recent Concern: Financial Resource Strain - High Risk (10/22/2022)   [facility]   Overall Financial Resource Strain (CARDIA)    Difficulty of Paying Living Expenses: Hard  Food Insecurity: No Food Insecurity (10/12/2023)   Hunger Vital Sign    Worried About Running Out of Food in the Last Year: Never true    Ran Out of Food in the Last Year: Never true  Transportation Needs: No Transportation Needs (10/12/2023)   PRAPARE - Administrator, Civil Service (Medical): No    Lack of Transportation (Non-Medical): No  Physical Activity: Not on file  Stress: No Stress Concern Present (03/04/2023)   Received from [facility] - Occupational Stress Questionnaire    Feeling of Stress : Not at all  Social Connections: Unknown (02/03/2023)   Received from [facility]   Social Network    Social Network: Not on file  Intimate Partner Violence: Not At Risk (10/12/2023)   Humiliation, Afraid, Rape, and Kick questionnaire    Fear of Current or Ex-Partner: No    Emotionally Abused: No    Physically Abused: No    Sexually Abused: No    Physical Exam: Vital signs in last 24 hours: Temp:  [97.8 F (36.6 C)-98.7 F (37.1 C)] 97.8 F (36.6 C) (  05/18 0854) Pulse Rate:  [69-78] 74 (05/18 0854) Resp:  [16-18] 18 (05/18 0854) BP: (122-175)/(79-82) 175/79 (05/18 0854) SpO2:  [95 %-100 %] 100 % (05/18 0854) Weight:  [79.5 kg-80.8 kg] 80.8 kg (05/18 0320) Last BM Date : 10/11/23 General:   Alert,  Well-developed, well-nourished, pleasant and cooperative in NAD Head:  Normocephalic and atraumatic. Eyes:  Sclera clear, no  icterus.   Conjunctiva pink. Ears:  Normal auditory acuity. Nose:  No deformity, discharge,  or lesions. Lungs:  Clear throughout to auscultation.   No wheezes, crackles, or rhonchi. No acute distress. Heart:  Regular rate and rhythm; no murmurs, clicks, rubs,  or gallops. Abdomen:  Soft, nontender and nondistended. No masses, hepatosplenomegaly or hernias noted. Normal bowel sounds, without guarding, and without rebound.   Extremities:  Without clubbing or edema.  Intake/Output from previous day: 05/17 0701 - 05/18 0700 In: 433.2 [P.O.:240; IV Piggyback:193.2] Out: -  Intake/Output this shift: No intake/output data recorded.  Lab Results: Recent Labs    10/12/23 0034  WBC 11.0*  HGB 9.5*  HCT 29.4*  PLT 225   BMET Recent Labs    10/12/23 0034  NA 137  K 3.9  CL 99  CO2 22  GLUCOSE 152*  BUN 54*  CREATININE 9.80*  CALCIUM  7.8*   LFT Recent Labs    10/12/23 0034  PROT 7.6  ALBUMIN 3.9  AST 27  ALT 17  ALKPHOS 110  BILITOT 0.7   PT/INR No results for input(s): "LABPROT", "INR" in the last 72 hours. Hepatitis Panel No results for input(s): "HEPBSAG", "HCVAB", "HEPAIGM", "HEPBIGM" in the last 72 hours.  Studies/Results: No results found.  Assessment/Plan:  33 year old male with history of diabetes, peripheral vascular disease, hypertension, hyperlipidemia, end-stage renal disease on hemodialysis via right internal jugular permacath.  He has been on a Monday Wednesday Friday schedule for dialysis.  Patient recently had thrombectomy with angioplasty and stenting of his AV fistula on the left arm.  Patient developed pain around the area of the AV fistula and was admitted to rule out sepsis.  ESRD: Will schedule for dialysis tomorrow morning with permacath.  Will attempt to remove 2 L of fluids.  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  HTN/VOL: Continue carvedilol , clonidine , irbesartan  and furosemide.  Sepsis:  Patient is now on vancomycin  and Rocephin .  Patient being followed by vascular surgery.  Spoke to the family at bedside. Labs and medications reviewed. Will continue to monitor closely.    LOS: 0 Worthy Heads, MD [facility] @TODAY @1 :44 PM

## 2023-10-12 NOTE — Progress Notes (Signed)
 PROGRESS NOTE    Darrell Walls  ZOX:[id number] DOB: 1990-05-30 DOA: 10/12/2023 PCP: Theda Finical, MD    Assessment & Plan:   Principal Problem:   Dialysis AV fistula infection, initial encounter (HCC) Active Problems:   End stage renal disease on dialysis (HCC)   Anemia due to chronic kidney disease   Diabetes mellitus type 2, insulin  dependent (HCC)   HTN (hypertension)   AV fistula infection, initial encounter (HCC)  Assessment and Plan: Dialysis AV fistula infection: continue on IV rocephin , vanco. US  vasc access ordered as per vasc surg. Vasc surg following and recs apprec    ESRD: on HD MWF. Nephro following and recs    ACD: secondary to ESRD. No need for a transfusion currently    HTN: restart home dose of coreg , irbesartan  & clonidine     DM2: well controlled, HbA1c 6.0. Continue on glargine, SSI w/ accuchecks  Depression: severity unknown. Continue on home dose of sertraline     DVT prophylaxis: heparin   Code Status: full  Family Communication: discussed pt's care w/ pt's family at bedside  Disposition Plan: likely d/c back home  Level of care: Med-Surg Status is: Inpatient Remains inpatient appropriate because: severity of illness    Consultants:  Vasc surg   Procedures:   Antimicrobials: vanco, rocephin    Subjective: Pt c/o fatigue   Objective: Vitals:   10/12/23 0033 10/12/23 0037 10/12/23 0320  BP: 122/82  (!) 153/80  Pulse: 78  69  Resp: 18  16  Temp: 98.7 F (37.1 C)  97.8 F (36.6 C)  TempSrc: Oral  Oral  SpO2: 95%  99%  Weight:  79.5 kg 80.8 kg  Height:   5\' 11"  (1.803 m)    Intake/Output Summary (Last 24 hours) at 10/12/2023 0820 Last data filed at 10/12/2023 0320 Gross per 24 hour  Intake 433.22 ml  Output --  Net 433.22 ml   Filed Weights   10/12/23 0037 10/12/23 0320  Weight: 79.5 kg 80.8 kg    Examination:  General exam: Appears calm and comfortable  Respiratory system: Clear to auscultation. Respiratory effort  normal. Cardiovascular system: S1 & S2+ No rubs, gallops or clicks.  Gastrointestinal system: Abdomen is nondistended, soft and nontender. Normal bowel sounds heard. Central nervous system: Alert and oriented. Moves all extremities  Psychiatry: Judgement and insight appear normal. Flat mood and affect     Data Reviewed: I have personally reviewed following labs and imaging studies  CBC: Recent Labs  Lab 10/05/23 1728 10/08/23 0852 10/12/23 0034  WBC 8.4 10.7* 11.0*  NEUTROABS  --   --  7.2  HGB 12.7* 10.8* 9.5*  HCT 38.1* 32.3* 29.4*  MCV 89.0 90.0 93.0  PLT 360 266 225   Basic Metabolic Panel: Recent Labs  Lab 10/06/23 1507 10/06/23 2150 10/07/23 0552 10/08/23 0621 10/12/23 0034  NA 136 134* 138 136 137  K 3.8 3.6 3.5 3.7 3.9  CL 97* 96* 97* 99 99  CO2 19* 21* 23 20* 22  GLUCOSE 152* 122* 128* 104* 152*  BUN 59* 65* 61* 73* 54*  CREATININE 12.12* 13.18* 11.83* 14.76* 9.80*  CALCIUM  9.0 8.9 9.1 7.7* 7.8*   GFR: Estimated Creatinine Clearance: 11.6 mL/min (A) (by C-G formula based on SCr of 9.8 mg/dL (H)). Liver Function Tests: Recent Labs  Lab 10/05/23 1728 10/12/23 0034  AST 24 27  ALT 21 17  ALKPHOS 132* 110  BILITOT 1.7* 0.7  PROT 9.4* 7.6  ALBUMIN 4.8 3.9   Recent Labs  Lab 10/05/23  1728  LIPASE 24   No results for input(s): "AMMONIA" in the last 168 hours. Coagulation Profile: Recent Labs  Lab 10/05/23 1728  INR 1.2   Cardiac Enzymes: No results for input(s): "CKTOTAL", "CKMB", "CKMBINDEX", "TROPONINI" in the last 168 hours. BNP (last 3 results) No results for input(s): "PROBNP" in the last 8760 hours. HbA1C: No results for input(s): "HGBA1C" in the last 72 hours. CBG: Recent Labs  Lab 10/07/23 2111 10/07/23 2319 10/08/23 0505 10/08/23 1332 10/08/23 1651  GLUCAP 125* 133* 103* 81 82   Lipid Profile: No results for input(s): "CHOL", "HDL", "LDLCALC", "TRIG", "CHOLHDL", "LDLDIRECT" in the last 72 hours. Thyroid Function Tests: No  results for input(s): "TSH", "T4TOTAL", "FREET4", "T3FREE", "THYROIDAB" in the last 72 hours. Anemia Panel: No results for input(s): "VITAMINB12", "FOLATE", "FERRITIN", "TIBC", "IRON", "RETICCTPCT" in the last 72 hours. Sepsis Labs: Recent Labs  Lab 10/05/23 1728 10/05/23 2038 10/06/23 0141 10/12/23 0156 10/12/23 0713  PROCALCITON  --   --  0.44  --   --   LATICACIDVEN 2.4* 1.7  --  0.9 0.6    Recent Results (from the past 240 hours)  Culture, blood (Routine x 2)     Status: None   Collection Time: 10/05/23  5:28 PM   Specimen: BLOOD RIGHT ARM  Result Value Ref Range Status   Specimen Description BLOOD RIGHT ARM  Final   Special Requests   Final    BOTTLES DRAWN AEROBIC AND ANAEROBIC Blood Culture adequate volume   Culture   Final    NO GROWTH 5 DAYS Performed at [facility], [address] [place], [place] [address]    Report Status 10/10/2023 FINAL  Final  Resp panel by RT-PCR (RSV, Flu A&B, Covid) Anterior Nasal Swab     Status: None   Collection Time: 10/05/23  8:38 PM   Specimen: Anterior Nasal Swab  Result Value Ref Range Status   SARS Coronavirus 2 by RT PCR NEGATIVE NEGATIVE Final    Comment: (NOTE) SARS-CoV-2 target nucleic acids are NOT DETECTED.  The SARS-CoV-2 RNA is generally detectable in upper respiratory specimens during the acute phase of infection. The lowest concentration of SARS-CoV-2 viral copies this assay can detect is 138 copies/mL. A negative result does not preclude SARS-Cov-2 infection and should not be used as the sole basis for treatment or other patient management decisions. A negative result may occur with  improper specimen collection/handling, submission of specimen other than nasopharyngeal swab, presence of viral mutation(s) within the areas targeted by this assay, and inadequate number of viral copies(<138 copies/mL). A negative result must be combined with clinical observations, patient history, and  epidemiological information. The expected result is Negative.  Fact Sheet for Patients:  BloggerCourse.com  Fact Sheet for Healthcare Providers:  SeriousBroker.it  This test is no t yet approved or cleared by the [place]  FDA and  has been authorized for detection and/or diagnosis of SARS-CoV-2 by FDA under an Emergency Use Authorization (EUA). This EUA will remain  in effect (meaning this test can be used) for the duration of the COVID-19 declaration under Section 564(b)(1) of the Act, 21 U.S.C.section 360bbb-3(b)(1), unless the authorization is terminated  or revoked sooner.       Influenza A by PCR NEGATIVE NEGATIVE Final   Influenza B by PCR NEGATIVE NEGATIVE Final    Comment: (NOTE) The Xpert Xpress SARS-CoV-2/FLU/RSV plus assay is intended as an aid in the diagnosis of influenza from Nasopharyngeal swab specimens and should not be used as  a sole basis for treatment. Nasal washings and aspirates are unacceptable for Xpert Xpress SARS-CoV-2/FLU/RSV testing.  Fact Sheet for Patients: BloggerCourse.com  Fact Sheet for Healthcare Providers: SeriousBroker.it  This test is not yet approved or cleared by the [place]  FDA and has been authorized for detection and/or diagnosis of SARS-CoV-2 by FDA under an Emergency Use Authorization (EUA). This EUA will remain in effect (meaning this test can be used) for the duration of the COVID-19 declaration under Section 564(b)(1) of the Act, 21 U.S.C. section 360bbb-3(b)(1), unless the authorization is terminated or revoked.     Resp Syncytial Virus by PCR NEGATIVE NEGATIVE Final    Comment: (NOTE) Fact Sheet for Patients: BloggerCourse.com  Fact Sheet for Healthcare Providers: SeriousBroker.it  This test is not yet approved or cleared by the [place]  FDA and has been  authorized for detection and/or diagnosis of SARS-CoV-2 by FDA under an Emergency Use Authorization (EUA). This EUA will remain in effect (meaning this test can be used) for the duration of the COVID-19 declaration under Section 564(b)(1) of the Act, 21 U.S.C. section 360bbb-3(b)(1), unless the authorization is terminated or revoked.  Performed at [facility], [address] [place], [place] [address]   MRSA Next Gen by PCR, Nasal     Status: None   Collection Time: 10/06/23  1:41 AM   Specimen: Nasal Mucosa; Nasal Swab  Result Value Ref Range Status   MRSA by PCR Next Gen NOT DETECTED NOT DETECTED Final    Comment: (NOTE) The GeneXpert MRSA Assay (FDA approved for NASAL specimens only), is one component of a comprehensive MRSA colonization surveillance program. It is not intended to diagnose MRSA infection nor to guide or monitor treatment for MRSA infections. Test performance is not FDA approved in patients less than 81 years old. Performed at [facility], [address] [place], [place] [address]   Culture, blood (Routine X 2) w Reflex to ID Panel     Status: None   Collection Time: 10/06/23  5:03 AM   Specimen: BLOOD  Result Value Ref Range Status   Specimen Description BLOOD BLOOD RIGHT ARM  Final   Special Requests Blood Culture adequate volume  Final   Culture   Final    NO GROWTH 5 DAYS Performed at [facility], [address] [place], [place] [address]    Report Status 10/11/2023 FINAL  Final  Blood culture (routine x 2)     Status: None (Preliminary result)   Collection Time: 10/12/23  1:56 AM   Specimen: BLOOD  Result Value Ref Range Status   Specimen Description BLOOD BLOOD RIGHT ARM  Final   Special Requests   Final    BOTTLES DRAWN AEROBIC AND ANAEROBIC Blood Culture results may not be optimal due to an inadequate volume of blood received in culture bottles   Culture   Final    NO GROWTH < 12 HOURS Performed at [facility], [address] [place], [place] [address]    Report Status PENDING  Incomplete  Blood culture (routine x 2)     Status: None (Preliminary result)   Collection Time: 10/12/23  1:56 AM   Specimen: BLOOD  Result Value Ref Range Status   Specimen Description BLOOD BLOOD LEFT ARM  Final   Special Requests   Final    BOTTLES DRAWN AEROBIC AND ANAEROBIC Blood Culture results may not be optimal due to an inadequate volume of blood received in culture bottles   Culture  Final    NO GROWTH < 12 HOURS Performed at [facility], [address] [place], [place] [address]    Report Status PENDING  Incomplete         Radiology Studies: No results found.      Scheduled Meds:  atorvastatin   20 mg Oral Daily   cloNIDine   0.3 mg Oral TID   furosemide  80 mg Oral BID   heparin   5,000 Units Subcutaneous Q8H   insulin  aspart  0-5 Units Subcutaneous QHS   insulin  aspart  0-6 Units Subcutaneous TID WC   insulin  glargine-yfgn  6 Units Subcutaneous QHS   Continuous Infusions:  cefTRIAXone  (ROCEPHIN )  IV 1 g (10/12/23 0329)   vancomycin      [START ON 10/13/2023] vancomycin        LOS: 0 days      Alphonsus Jeans, MD [facility] Pager [phone]  If 7PM-7AM, please contact night-coverage [email or web address] 10/12/2023, 8:20 AM

## 2023-10-12 NOTE — Procedures (Signed)
 Pharmacy Antibiotic Note  Darrell Walls is a 33 y.o. male w/ ESRD on MWF HD, admitted on 10/12/2023 with cellulitis.  Pharmacy has been consulted for Vancomycin  dosing for 7 days.  Plan: Pt given total of Vancomycin  2000 mg once. Vancomycin  750 mg IV qMWF  following dialysis.  Pharmacy will continue to follow and will adjust abx dosing whenever warranted.  Temp (24hrs), Avg:98.7 F (37.1 C), Min:98.7 F (37.1 C), Max:98.7 F (37.1 C)   Recent Labs  Lab 10/05/23 1728 10/05/23 2038 10/06/23 0141 10/06/23 1507 10/06/23 2150 10/07/23 0552 10/08/23 0621 10/08/23 0852 10/12/23 0034 10/12/23 0156  WBC 8.4  --   --   --   --   --   --  10.7* 11.0*  --   CREATININE 9.99*  --    < > 12.12* 13.18* 11.83* 14.76*  --  9.80*  --   LATICACIDVEN 2.4* 1.7  --   --   --   --   --   --   --  0.9   < > = values in this interval not displayed.    Estimated Creatinine Clearance: 11.6 mL/min (A) (by C-G formula based on SCr of 9.8 mg/dL (H)).    No Known Allergies  Antimicrobials this admission: 5/18 Ceftriaxone  >>  5/18 Vancomycin  >> x 7 days  Microbiology results: 5/18 BCx: Pending  Thank you for allowing pharmacy to be a part of this patient's care.  Coretta Dexter, PharmD, [facility] 10/12/2023 2:23 AM

## 2023-10-12 NOTE — Assessment & Plan Note (Signed)
 Hemoglobin stable

## 2023-10-12 NOTE — ED Notes (Signed)
 Bruit auscultated to fistula in LFA. Faint thrill palpated. Pt has swelling and redness surrounding fistula site that extends up his arm. Area is tender and hot to the touch. Blister noted near wrist.

## 2023-10-12 NOTE — ED Provider Notes (Signed)
 [facility] Provider Note    Event Date/Time   First MD Initiated Contact with Patient 10/12/23 [phone]     (approximate)  History   Chief Complaint: Vascular Access Problem and Post-op Problem  HPI  Darrell Walls is a 33 y.o. male with a past medical history of diabetes, hypertension, hyperlipidemia, ESRD on HD who presents to the emergency department for left arm pain and redness.  According to the patient and record review patient had a surgery by Dr. Vonna Guardian vascular surgery on 514 for left AV fistula placement.  He states since that time he has had worsening pain and swelling and redness to this area that is now extended proximally midway up the bicep.  Patient denies any known fever, afebrile in the emergency department.  Worsening pain per patient.  Physical Exam   Triage Vital Signs: ED Triage Vitals  Encounter Vitals Group     BP 10/12/23 0033 122/82     Systolic BP Percentile --      Diastolic BP Percentile --      Pulse Rate 10/12/23 0033 78     Resp 10/12/23 0033 18     Temp 10/12/23 0033 98.7 F (37.1 C)     Temp Source 10/12/23 0033 Oral     SpO2 10/12/23 0033 95 %     Weight 10/12/23 0037 175 lb 4.3 oz (79.5 kg)     Height --      Head Circumference --      Peak Flow --      Pain Score 10/12/23 0037 9     Pain Loc --      Pain Education --      Exclude from Growth Chart --     Most recent vital signs: Vitals:   10/12/23 0033  BP: 122/82  Pulse: 78  Resp: 18  Temp: 98.7 F (37.1 C)  SpO2: 95%    General: Awake, no distress.  CV:  Good peripheral perfusion.  Regular rate and rhythm  Resp:  Normal effort.  Equal breath sounds bilaterally.  Abd:  No distention.  Other:  Patient with tenderness extending over the AV fistula in the distal left forearm proximally to the mid bicep where he also has erythema and swelling noted in the same areas.  There is no drainage from the incision.  There is a good thrill and bruit over the  fistula.   ED Results / Procedures / Treatments   MEDICATIONS ORDERED IN ED: Medications - No data to display   IMPRESSION / MDM / ASSESSMENT AND PLAN / ED COURSE  I reviewed the triage vital signs and the nursing notes.  Patient's presentation is most consistent with acute presentation with potential threat to life or bodily function.  Patient presents to the emergency department for left upper remedy pain redness and swelling 4 days after an AV fistula was placed by vascular surgery.  Patient CBC overall reassuring with a mild leukocytosis, chemistry shows a reassuring potassium chronic kidney disease/ESRD.  Given the patient's worsening pain redness and swelling I spoke to Dr. Hampton Levins of vascular surgery.  She agrees with plan to place on IV antibiotics and admit.  Will admit to the hospital service placed patient on vancomycin  and pain medication we will send blood cultures.  Patient agreeable to plan.  FINAL CLINICAL IMPRESSION(S) / ED DIAGNOSES   Postoperative infection    Note:  This document was prepared using Dragon voice recognition software and may include unintentional dictation  errors.   Ruth Cove, MD 10/12/23 [phone]

## 2023-10-12 NOTE — Assessment & Plan Note (Signed)
 Sliding scale insulin coverage

## 2023-10-12 NOTE — Consult Note (Signed)
 [facility] Vascular Consult Note  MRN : [id number]  Darrell Walls is a 33 y.o. (1990/09/22) male who presents with chief complaint of  Chief Complaint  Patient presents with   Vascular Access Problem   Post-op Problem  .  History of Present Illness: 25 yom with ESRD on HD via RIGHT internal jugular permacath- M/W/F; s/p LEFT radiocephalic AV fistula placement 86/5784; the fistula has failed to mature- he has had 2 endovascular interventions; including a thrombectomy, angioplasty and stenting 10/08/2023. He presented to the ED yesterday with progressive LEFT arm pain, swelling and redness. Denies drainage from access site. Denies hand pain, numbness or weakness. Tolerated HD on Friday  Current Facility-Administered Medications  Medication Dose Route Frequency Provider Last Rate Last Admin   acetaminophen  (TYLENOL ) tablet 650 mg  650 mg Oral Q6H PRN Duncan, Hazel V, MD       Or   acetaminophen  (TYLENOL ) suppository 650 mg  650 mg Rectal Q6H PRN Duncan, Hazel V, MD       atorvastatin  (LIPITOR) tablet 20 mg  20 mg Oral Daily Duncan, Hazel V, MD   20 mg at 10/12/23 0914   cefTRIAXone  (ROCEPHIN ) 1 g in sodium chloride  0.9 % 100 mL IVPB  1 g Intravenous Q0600 Brion Cancel V, MD 200 mL/hr at 10/12/23 0329 1 g at 10/12/23 0329   cloNIDine  (CATAPRES ) tablet 0.3 mg  0.3 mg Oral TID Lanetta Pion, MD   0.3 mg at 10/12/23 6962   furosemide (LASIX) tablet 80 mg  80 mg Oral BID Duncan, Hazel V, MD   80 mg at 10/12/23 9528   heparin  injection 5,000 Units  5,000 Units Subcutaneous Q8H Lanetta Pion, MD       insulin  aspart (novoLOG ) injection 0-5 Units  0-5 Units Subcutaneous QHS Duncan, Hazel V, MD       insulin  aspart (novoLOG ) injection 0-6 Units  0-6 Units Subcutaneous TID WC Duncan, Hazel V, MD   1 Units at 10/12/23 4132   insulin  glargine-yfgn (SEMGLEE ) injection 6 Units  6 Units Subcutaneous QHS Lanetta Pion, MD       morphine  (PF) 2 MG/ML injection 2 mg  2 mg  Intravenous Q2H PRN Duncan, Hazel V, MD   2 mg at 10/12/23 4401   ondansetron  (ZOFRAN ) tablet 4 mg  4 mg Oral Q6H PRN Duncan, Hazel V, MD       Or   ondansetron  (ZOFRAN ) injection 4 mg  4 mg Intravenous Q6H PRN Duncan, Hazel V, MD       oxyCODONE  (Oxy IR/ROXICODONE ) immediate release tablet 5 mg  5 mg Oral Q4H PRN Duncan, Hazel V, MD   5 mg at 10/12/23 0272   vancomycin  (VANCOCIN ) IVPB 1000 mg/200 mL premix  1,000 mg Intravenous Once Belue, Nathan S, RPH 200 mL/hr at 10/12/23 0912 1,000 mg at 10/12/23 0912   [START ON 10/13/2023] vancomycin  (VANCOREADY) IVPB 750 mg/150 mL  750 mg Intravenous Q M,W,F-HD Coretta Dexter, RPH        Past Medical History:  Diagnosis Date   Adjustment disorder with mixed anxiety and depressed mood    Anemia due to chronic kidney disease    Asthma    Cannabis use disorder    Diabetes mellitus type 2, insulin  dependent (HCC)    DKA (diabetic ketoacidosis) (HCC)    End stage renal disease on dialysis (HCC)    Hyperlipidemia    Hypertension associated with type 2 diabetes mellitus (HCC)    Intractable  vomiting with nausea 10/06/2023   Obstructive sleep apnea     Past Surgical History:  Procedure Laterality Date   A/V FISTULAGRAM Left 09/11/2023   Procedure: A/V Fistulagram;  Surgeon: Celso College, MD;  Location: [facility] INVASIVE CV [facility];  Service: Cardiovascular;  Laterality: Left;   A/V FISTULAGRAM Left 10/08/2023   Procedure: A/V FISTULAGRAM;  Surgeon: Celso College, MD;  Location: [facility] INVAS[facility] CV [facility];  Service: Cardiovascular;  Laterality: Left;   AV FISTULA PLACEMENT Left 07/17/2023   Procedure: ARTERIOVENOUS (AV) FISTULA CREATION (RADIALCEPHALIC);  Surgeon: Celso College, MD;  Location: [facility] ORS;  Service: Vascular;  Laterality: Left;   CAPD REMOVAL N/A 07/17/2023   Procedure: CONTINUOUS AMBULATORY PERITONEAL DIALYSIS  (CAPD) CATHETER REMOVAL;  Surgeon: Celso College, MD;  Location: [facility] ORS;  Service: Vascular;  Laterality: N/A;   DIALYSIS/PERMA CATHETER INSERTION   10/2022   PARS PLANA VITRECTOMY W/ ENDOLASER PANRETINAL PHOTOCOAGULATION Right 06/29/2018   PERITONEAL CATHETER INSERTION  03/04/2023   TONSILLECTOMY AND ADENOIDECTOMY  2002    Social History Social History   Tobacco Use   Smoking status: Former    Current packs/day: 0.00    Types: Cigarettes    Quit date: 05/2023    Years since quitting: 0.3   Smokeless tobacco: Never  Vaping Use   Vaping status: Never Used  Substance Use Topics   Alcohol use: Not Currently   Drug use: Yes    Types: Marijuana    Comment: about a month ago    Family History Family History  Problem Relation Age of Onset   Healthy Mother     No Known Allergies   REVIEW OF SYSTEMS (Negative unless checked)  Constitutional: [] Weight loss  [] Fever  [] Chills Cardiac: [] Chest pain   [] Chest pressure   [] Palpitations   [] Shortness of breath when laying flat   [] Shortness of breath at rest   [] Shortness of breath with exertion. Vascular:  [] Pain in legs with walking   [] Pain in legs at rest   [] Pain in legs when laying flat   [] Claudication   [] Pain in feet when walking  [] Pain in feet at rest  [] Pain in feet when laying flat   [] History of DVT   [] Phlebitis   [] Swelling in legs   [] Varicose veins   [] Non-healing ulcers Pulmonary:   [] Uses home oxygen   [] Productive cough   [] Hemoptysis   [] Wheeze  [] COPD   [] Asthma Neurologic:  [] Dizziness  [] Blackouts   [] Seizures   [] History of stroke   [] History of TIA  [] Aphasia   [] Temporary blindness   [] Dysphagia   [] Weakness or numbness in arms   [] Weakness or numbness in legs Musculoskeletal:  [] Arthritis   [] Joint swelling   [] Joint pain   [] Low back pain Hematologic:  [] Easy bruising  [] Easy bleeding   [] Hypercoagulable state   [] Anemic  [] Hepatitis Gastrointestinal:  [] Blood in stool   [] Vomiting blood  [] Gastroesophageal reflux/heartburn   [] Difficulty swallowing. Genitourinary:  [] Chronic kidney disease   [] Difficult urination  [] Frequent urination  [] Burning with  urination   [] Blood in urine Skin:  [] Rashes   [] Ulcers   [] Wounds Psychological:  [] History of anxiety   []  History of major depression.  Physical Examination  Vitals:   10/12/23 0033 10/12/23 0037 10/12/23 0320 10/12/23 0854  BP: 122/82  (!) 153/80 (!) 175/79  Pulse: 78  69 74  Resp: 18  16 18   Temp: 98.7 F (37.1 C)  97.8 F (36.6 C) 97.8 F (36.6 C)  TempSrc:  Oral  Oral Oral  SpO2: 95%  99% 100%  Weight:  79.5 kg 80.8 kg   Height:   5\' 11"  (1.803 m)    Body mass index is 24.84 kg/m. Gen:  WD/WN, NAD Head: [place]/AT, No temporalis wasting. Prominent temp pulse not noted. Pulmonary:  Good air movement, respirations not labored, equal bilaterally.  Cardiac: RRR, normal S1, S2. Vascular: LEFT upper extremity- warm- forearm edema, tender to touch, erythema, +thrill/bruit in fistula, +radial, +ulnar pulses, Hand grip 4/5 .      CBC Lab Results  Component Value Date   WBC 11.0 (H) 10/12/2023   HGB 9.5 (L) 10/12/2023   HCT 29.4 (L) 10/12/2023   MCV 93.0 10/12/2023   PLT 225 10/12/2023    BMET    Component Value Date/Time   NA 137 10/12/2023 0034   K 3.9 10/12/2023 0034   CL 99 10/12/2023 0034   CO2 22 10/12/2023 0034   GLUCOSE 152 (H) 10/12/2023 0034   BUN 54 (H) 10/12/2023 0034   CREATININE 9.80 (H) 10/12/2023 0034   CALCIUM  7.8 (L) 10/12/2023 0034   GFRNONAA 7 (L) 10/12/2023 0034   GFRAA >60 09/23/2019 0958   Estimated Creatinine Clearance: 11.6 mL/min (A) (by C-G formula based on SCr of 9.8 mg/dL (H)).  COAG Lab Results  Component Value Date   INR 1.2 10/05/2023    Radiology PERIPHERAL VASCULAR CATHETERIZATION Result Date: 10/08/2023 See surgical note for result.  US  Venous Img Upper Uni Left Result Date: 10/05/2023 CLINICAL DATA:  left upper extremity pain and swelling, multiple prior fistula surgeries with stenosis, reportedly recent abscess in extremity, evaluting for clot vs cellulitis vs abscess EXAM: LEFT UPPER EXTREMITY VENOUS DOPPLER ULTRASOUND  TECHNIQUE: Gray-scale sonography with graded compression, as well as color Doppler and duplex ultrasound were performed to evaluate the upper extremity deep venous system from the level of the subclavian vein and including the jugular, axillary, basilic, radial, ulnar and upper cephalic vein. Spectral Doppler was utilized to evaluate flow at rest and with distal augmentation maneuvers. COMPARISON:  Report from an ultrasound in an outside institution five days ago, images not available. FINDINGS: Contralateral Subclavian Vein: Respiratory phasicity is normal and symmetric with the symptomatic side. No evidence of thrombus. Normal compressibility. Internal Jugular Vein: No evidence of thrombus. Normal compressibility, respiratory phasicity and response to augmentation. Subclavian Vein: No evidence of thrombus. Normal compressibility, respiratory phasicity and response to augmentation. Axillary Vein: No evidence of thrombus. Normal compressibility, respiratory phasicity and response to augmentation. Cephalic Vein: The cephalic AV fistula described on prior exam is not evaluated. Flow is demonstrated in the cephalic vein. No fluid collection is seen on the current exam Basilic Vein: No evidence of thrombus. Normal compressibility, respiratory phasicity and response to augmentation. Brachial Veins: No evidence of thrombus. Normal compressibility, respiratory phasicity and response to augmentation. Radial Veins: No evidence of thrombus. Normal compressibility, respiratory phasicity and response to augmentation. Ulnar Veins: Fistula at the wrist which appears thrombosed. Other Findings:  No evidence of focal fluid collection. IMPRESSION: 1. 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 2. The cephalic AV fistula described on prior outside exam with associated collection is not seen on provided images. Vascular surgery follow-up is recommended. 3. No evidence of left upper extremity DVT. Electronically Signed   By: Chadwick Colonel M.D.    On: 10/05/2023 21:39   CT ABDOMEN PELVIS WO CONTRAST Result Date: 10/05/2023 CLINICAL DATA:  Black emesis. EXAM: CT ABDOMEN AND PELVIS WITHOUT CONTRAST TECHNIQUE: Multidetector CT imaging of the abdomen  and pelvis was performed following the standard protocol without IV contrast. RADIATION DOSE REDUCTION: This exam was performed according to the departmental dose-optimization program which includes automated exposure control, adjustment of the mA and/or kV according to patient size and/or use of iterative reconstruction technique. COMPARISON:  May 15, 2022 FINDINGS: Lower chest: A 12 mm thick anterior pericardial effusion is noted. Hepatobiliary: No focal liver abnormality is seen. No gallstones, gallbladder wall thickening, or biliary dilatation. Pancreas: Unremarkable. No pancreatic ductal dilatation or surrounding inflammatory changes. Spleen: Normal in size without focal abnormality. Adrenals/Urinary Tract: Adrenal glands are unremarkable. Kidneys are normal, without renal calculi, focal lesion, or hydronephrosis. Urinary bladder is poorly distended. Mild diffuse urinary bladder wall thickening is seen. Stomach/Bowel: Stomach is within normal limits. Appendix appears normal. Stool is seen throughout the large bowel. No evidence of bowel wall thickening, distention, or inflammatory changes. Vascular/Lymphatic: Aortic atherosclerosis with diffuse calcification of the arterial structures within the abdomen and pelvis. No enlarged abdominal or pelvic lymph nodes. Reproductive: Prostate is unremarkable. Other: No abdominal wall hernia or abnormality. No abdominopelvic ascites. Musculoskeletal: No acute or significant osseous findings. IMPRESSION: 1. Small anterior pericardial effusion. 2. Aortic atherosclerosis. Electronically Signed   By: Virgle Grime M.D.   On: 10/05/2023 20:34   DG Chest Port 1 View Result Date: 10/05/2023 CLINICAL DATA:  Suspected sepsis.  Vomiting. EXAM: PORTABLE CHEST 1 VIEW  COMPARISON:  Radiograph 04/27/2019 FINDINGS: Right-sided dialysis catheter tip in the right atrium.The cardiomediastinal contours are normal. The lungs are clear. Pulmonary vasculature is normal. No consolidation, pleural effusion, or pneumothorax. No acute osseous abnormalities are seen. IMPRESSION: No active disease. Electronically Signed   By: Chadwick Colonel M.D.   On: 10/05/2023 19:43      Assessment/Plan 1. LEFT Radiocephalic AV fistula, s/p thrombectomy, angioplasty/stenting 5/14 Will obtain duplex- fistula patent however, may have hematoma or reactive inflammation/soft tissue infection; Continue ABX, will follow cultures Further recommendations once study complete Elevate LEFT arm for comfort HD as scheduled M/W/F per nephrology   Lesta Rater, MD  10/12/2023 9:21 AM    This note was created with Dragon medical transcription system.  Any error is purely unintentional

## 2023-10-12 NOTE — Assessment & Plan Note (Signed)
 Rocephin  and vancomycin  Pain control Keep arm elevated Vascular consulted

## 2023-10-12 NOTE — H&P (Signed)
 History and Physical    Patient: Darrell Walls NFA:[id number] DOB: 1991/02/21 DOA: 10/12/2023 DOS: the patient was seen and examined on 10/12/2023 PCP: Theda Finical, MD  Patient coming from: Home  Chief Complaint:  Chief Complaint  Patient presents with   Vascular Access Problem   Post-op Problem    HPI: Darrell Walls is a 33 y.o. male with medical history significant for DM, HTN, HLD, ESRD on HD MWF recently had AV fistula placement on 5/14 who presents to the ED with pain redness and swelling at the site of the fistula.  He denies fever or chills. ED course and data review: Vitals within normal limits Labs  notable for mild leukocytosis of 11, hemoglobin 9.5, near baseline, CMP as expected for dialysis status, normal potassium and bicarb slightly elevated anion gap of 16  The ED provider spoke with vascular, Dr. Hampton Levins who recommended admission for IV antibiotics  Hospitalist consulted for admission     Past Medical History:  Diagnosis Date   Adjustment disorder with mixed anxiety and depressed mood    Anemia due to chronic kidney disease    Asthma    Cannabis use disorder    Diabetes mellitus type 2, insulin  dependent (HCC)    DKA (diabetic ketoacidosis) (HCC)    End stage renal disease on dialysis (HCC)    Hyperlipidemia    Hypertension associated with type 2 diabetes mellitus (HCC)    Intractable vomiting with nausea 10/06/2023   Obstructive sleep apnea    Past Surgical History:  Procedure Laterality Date   A/V FISTULAGRAM Left 09/11/2023   Procedure: A/V Fistulagram;  Surgeon: Celso College, MD;  Location: [facility];  Service: Cardiovascular;  Laterality: Left;   A/V FISTULAGRAM Left 10/08/2023   Procedure: A/V FISTULAGRAM;  Surgeon: Celso College, MD;  Location: [facility];  Service: Cardiovascular;  Laterality: Left;   AV FISTULA PLACEMENT Left 07/17/2023   Procedure: ARTERIOVENOUS (AV) FISTULA CREATION (RADIALCEPHALIC);  Surgeon: Celso College, MD;   Location: [facility] ORS;  Service: Vascular;  Laterality: Left;   CAPD REMOVAL N/A 07/17/2023   Procedure: CONTINUOUS AMBULATORY PERITONEAL DIALYSIS  (CAPD) CATHETER REMOVAL;  Surgeon: Celso College, MD;  Location: [facility] ORS;  Service: Vascular;  Laterality: N/A;   DIALYSIS/PERMA CATHETER INSERTION  10/2022   PARS PLANA VITRECTOMY W/ ENDOLASER PANRETINAL PHOTOCOAGULATION Right 06/29/2018   PERITONEAL CATHETER INSERTION  03/04/2023   TONSILLECTOMY AND ADENOIDECTOMY  2002   Social History:  reports that he quit smoking about 4 months ago. His smoking use included cigarettes. He has never used smokeless tobacco. He reports that he does not currently use alcohol. He reports current drug use. Drug: Marijuana.  No Known Allergies  Family History  Problem Relation Age of Onset   Healthy Mother     Prior to Admission medications   Medication Sig Start Date End Date Taking? Authorizing Provider  albuterol (VENTOLIN HFA) 108 (90 Base) MCG/ACT inhaler Inhale 2 puffs into the lungs every 6 (six) hours as needed for wheezing or shortness of breath. Patient not taking: Reported on 09/01/2023    [provider]  amLODipine  (NORVASC ) 10 MG tablet Take 10 mg by mouth daily.    [provider]  atorvastatin  (LIPITOR) 20 MG tablet Take 1 tablet by mouth daily. 04/16/22 08/27/24  [provider]  Calcium  Acetate 667 MG TABS Take 1,334 mg by mouth 3 (three) times daily with meals.    [provider]  carvedilol  (COREG ) 25 MG tablet  Take 25 mg by mouth 2 (two) times daily. 03/18/22   [provider]  cloNIDine  (CATAPRES ) 0.3 MG tablet Take 0.3 mg by mouth 3 (three) times daily. 09/06/23   [provider]  Continuous Glucose Receiver (DEXCOM G7 RECEIVER) DEVI by Does not apply route.    [provider]  Continuous Glucose Sensor (DEXCOM G7 SENSOR) MISC by Does not apply route.    [provider]  doxazosin  (CARDURA ) 4 MG tablet Take 4 mg by mouth 2  (two) times daily.    [provider]  EPINEPHrine 0.3 MG/0.3ML SOSY Inject 0.3 mg into the muscle once.    [provider]  furosemide (LASIX) 80 MG tablet Take 80 mg by mouth 2 (two) times daily. 10/02/23   [provider]  gabapentin  (NEURONTIN ) 100 MG capsule Take 100 mg by mouth 3 (three) times a week. With dialysis M-W-F    [provider]  hydrALAZINE  (APRESOLINE ) 100 MG tablet Take 100 mg by mouth 3 (three) times daily.    [provider]  insulin  glargine (LANTUS ) 100 UNIT/ML injection Inject 12 Units into the skin daily with supper.    [provider]  insulin  lispro (HUMALOG) 100 UNIT/ML KwikPen Inject 2-4 Units into the skin 3 (three) times daily with meals. 03/19/22   [provider]  Insulin  Pen Needle 29G X MISC Please dispense 1 box of 100 insulin  needles and syringes 09/23/19   Bryson Carbine, MD  irbesartan  (AVAPRO ) 300 MG tablet Take 300 mg by mouth at bedtime.    [provider]  mirtazapine  (REMERON ) 15 MG tablet Take 15 mg by mouth at bedtime.    [provider]  promethazine  (PHENERGAN ) 12.5 MG tablet Take 12.5 mg by mouth every 6 (six) hours as needed for nausea or vomiting.    [provider]  sertraline  (ZOLOFT ) 50 MG tablet Take 50 mg by mouth daily.    [provider]  sodium bicarbonate 650 MG tablet Take 650 mg by mouth 2 (two) times daily.    [provider]  traMADol  (ULTRAM ) 50 MG tablet Take 1 tablet (50 mg total) by mouth every 6 (six) hours as needed. Patient not taking: Reported on 10/07/2023 07/17/23 07/16/24  Celso College, MD  traZODone  (DESYREL ) 50 MG tablet Take 50 mg by mouth at bedtime.    [provider]    Physical Exam: Vitals:   10/12/23 0033 10/12/23 0037  BP: 122/82   Pulse: 78   Resp: 18   Temp: 98.7 F (37.1 C)   TempSrc: Oral   SpO2: 95%   Weight:  79.5 kg   Physical Exam Vitals and nursing note reviewed.   Constitutional:      General: He is not in acute distress. HENT:     Head: Normocephalic and atraumatic.  Cardiovascular:     Rate and Rhythm: Normal rate and regular rhythm.     Heart sounds: Normal heart sounds.  Pulmonary:     Effort: Pulmonary effort is normal.     Breath sounds: Normal breath sounds.  Abdominal:     Palpations: Abdomen is soft.     Tenderness: There is no abdominal tenderness.  Musculoskeletal:     Comments: Pain redness swelling left forearm around site of AV fistula.  Patient having difficulty extending forearm. See pic below  Neurological:     Mental Status: Mental status is at baseline.     Labs on Admission: I have personally reviewed following labs  and imaging studies  CBC: Recent Labs  Lab 10/05/23 1728 10/08/23 0852 10/12/23 0034  WBC 8.4 10.7* 11.0*  NEUTROABS  --   --  7.2  HGB 12.7* 10.8* 9.5*  HCT 38.1* 32.3* 29.4*  MCV 89.0 90.0 93.0  PLT 360 266 225   Basic Metabolic Panel: Recent Labs  Lab 10/06/23 1507 10/06/23 2150 10/07/23 0552 10/08/23 0621 10/12/23 0034  NA 136 134* 138 136 137  K 3.8 3.6 3.5 3.7 3.9  CL 97* 96* 97* 99 99  CO2 19* 21* 23 20* 22  GLUCOSE 152* 122* 128* 104* 152*  BUN 59* 65* 61* 73* 54*  CREATININE 12.12* 13.18* 11.83* 14.76* 9.80*  CALCIUM  9.0 8.9 9.1 7.7* 7.8*   GFR: Estimated Creatinine Clearance: 11.6 mL/min (A) (by C-G formula based on SCr of 9.8 mg/dL (H)). Liver Function Tests: Recent Labs  Lab 10/05/23 1728 10/12/23 0034  AST 24 27  ALT 21 17  ALKPHOS 132* 110  BILITOT 1.7* 0.7  PROT 9.4* 7.6  ALBUMIN 4.8 3.9   Recent Labs  Lab 10/05/23 1728  LIPASE 24   No results for input(s): "AMMONIA" in the last 168 hours. Coagulation Profile: Recent Labs  Lab 10/05/23 1728  INR 1.2   Cardiac Enzymes: No results for input(s): "CKTOTAL", "CKMB", "CKMBINDEX", "TROPONINI" in the last 168 hours. BNP (last 3 results) No results for input(s): "PROBNP" in the last 8760 hours. HbA1C: No  results for input(s): "HGBA1C" in the last 72 hours. CBG: Recent Labs  Lab 10/07/23 2111 10/07/23 2319 10/08/23 0505 10/08/23 1332 10/08/23 1651  GLUCAP 125* 133* 103* 81 82   Lipid Profile: No results for input(s): "CHOL", "HDL", "LDLCALC", "TRIG", "CHOLHDL", "LDLDIRECT" in the last 72 hours. Thyroid Function Tests: No results for input(s): "TSH", "T4TOTAL", "FREET4", "T3FREE", "THYROIDAB" in the last 72 hours. Anemia Panel: No results for input(s): "VITAMINB12", "FOLATE", "FERRITIN", "TIBC", "IRON", "RETICCTPCT" in the last 72 hours. Urine analysis:    Component Value Date/Time   COLORURINE YELLOW (A) 05/16/2022 0410   APPEARANCEUR HAZY (A) 05/16/2022 0410   LABSPEC 1.012 05/16/2022 0410   PHURINE 5.0 05/16/2022 0410   GLUCOSEU >=500 (A) 05/16/2022 0410   HGBUR SMALL (A) 05/16/2022 0410   BILIRUBINUR NEGATIVE 05/16/2022 0410   KETONESUR NEGATIVE 05/16/2022 0410   PROTEINUR >=300 (A) 05/16/2022 0410   NITRITE NEGATIVE 05/16/2022 0410   LEUKOCYTESUR NEGATIVE 05/16/2022 0410    Radiological Exams on Admission: No results found. Data Reviewed for HPI: Relevant notes from primary care and specialist visits, past discharge summaries as available in EHR, including Care Everywhere. Prior diagnostic testing as pertinent to current admission diagnoses Updated medications and problem lists for reconciliation ED course, including vitals, labs, imaging, treatment and response to treatment Triage notes, nursing and pharmacy notes and ED provider's notes Notable results as noted above in HPI      Assessment and Plan: * Dialysis AV fistula infection, initial encounter (HCC) Rocephin  and vancomycin  Pain control Keep arm elevated Vascular consulted  End stage renal disease on dialysis [facility]) Nephrology consult for continuation of dialysis  Anemia due to chronic kidney disease Hemoglobin stable  HTN (hypertension) Continue home meds pending verification  Diabetes mellitus  type 2, insulin  dependent (HCC) Sliding scale insulin  coverage     DVT prophylaxis: Lovenox   Consults: vascular, Dr Hampton Levins  Advance Care Planning:   Code Status: Prior   Family Communication: none  Disposition Plan: Back to previous home environment  Severity of Illness: The appropriate patient status for this patient  is INPATIENT. Inpatient status is judged to be reasonable and necessary in order to provide the required intensity of service to ensure the patient's safety. The patient's presenting symptoms, physical exam findings, and initial radiographic and laboratory data in the context of their chronic comorbidities is felt to place them at high risk for further clinical deterioration. Furthermore, it is not anticipated that the patient will be medically stable for discharge from the hospital within 2 midnights of admission.   * I certify that at the point of admission it is my clinical judgment that the patient will require inpatient hospital care spanning beyond 2 midnights from the point of admission due to high intensity of service, high risk for further deterioration and high frequency of surveillance required.*  Author: Lanetta Pion, MD 10/12/2023 2:00 AM  For on call review [email or web address].

## 2023-10-13 ENCOUNTER — Other Ambulatory Visit: Payer: Self-pay

## 2023-10-13 DIAGNOSIS — Z794 Long term (current) use of insulin: Secondary | ICD-10-CM

## 2023-10-13 DIAGNOSIS — N186 End stage renal disease: Secondary | ICD-10-CM

## 2023-10-13 DIAGNOSIS — T827XXA Infection and inflammatory reaction due to other cardiac and vascular devices, implants and grafts, initial encounter: Secondary | ICD-10-CM | POA: Diagnosis not present

## 2023-10-13 DIAGNOSIS — Z95828 Presence of other vascular implants and grafts: Secondary | ICD-10-CM

## 2023-10-13 DIAGNOSIS — E1122 Type 2 diabetes mellitus with diabetic chronic kidney disease: Secondary | ICD-10-CM

## 2023-10-13 DIAGNOSIS — T82898A Other specified complication of vascular prosthetic devices, implants and grafts, initial encounter: Secondary | ICD-10-CM

## 2023-10-13 DIAGNOSIS — Z9889 Other specified postprocedural states: Secondary | ICD-10-CM

## 2023-10-13 DIAGNOSIS — T82590S Other mechanical complication of surgically created arteriovenous fistula, sequela: Secondary | ICD-10-CM

## 2023-10-13 DIAGNOSIS — Z992 Dependence on renal dialysis: Secondary | ICD-10-CM

## 2023-10-13 LAB — GLUCOSE, CAPILLARY
Glucose-Capillary: 174 mg/dL — ABNORMAL HIGH (ref 70–99)
Glucose-Capillary: 71 mg/dL (ref 70–99)
Glucose-Capillary: 82 mg/dL (ref 70–99)
Glucose-Capillary: 65 mg/dL — ABNORMAL LOW (ref 70–99)
Glucose-Capillary: 76 mg/dL (ref 70–99)
Glucose-Capillary: 112 mg/dL — ABNORMAL HIGH (ref 70–99)
Glucose-Capillary: 113 mg/dL — ABNORMAL HIGH (ref 70–99)

## 2023-10-13 LAB — RENAL FUNCTION PANEL
Albumin: 3.1 g/dL — ABNORMAL LOW (ref 3.5–5.0)
Anion gap: 17 — ABNORMAL HIGH (ref 5–15)
BUN: 83 mg/dL — ABNORMAL HIGH (ref 6–20)
CO2: 19 mmol/L — ABNORMAL LOW (ref 22–32)
Calcium: 7.9 mg/dL — ABNORMAL LOW (ref 8.9–10.3)
Chloride: 98 mmol/L (ref 98–111)
Creatinine, Ser: 13.31 mg/dL — ABNORMAL HIGH (ref 0.61–1.24)
GFR, Estimated: 5 mL/min — ABNORMAL LOW (ref >60)
Glucose, Bld: 83 mg/dL (ref 70–99)
Phosphorus: 10.2 mg/dL — ABNORMAL HIGH (ref 2.5–4.6)
Potassium: 5 mmol/L (ref 3.5–5.1)
Sodium: 134 mmol/L — ABNORMAL LOW (ref 135–145)

## 2023-10-13 LAB — PROCALCITONIN: Procalcitonin: 0.24 ng/mL

## 2023-10-13 LAB — CBC
HCT: 29.2 % — ABNORMAL LOW (ref 39.0–52.0)
Hemoglobin: 9.7 g/dL — ABNORMAL LOW (ref 13.0–17.0)
MCH: 30.4 pg (ref 26.0–34.0)
MCHC: 33.2 g/dL (ref 30.0–36.0)
MCV: 91.5 fL (ref 80.0–100.0)
Platelets: 247 10*3/uL (ref 150–400)
RBC: 3.19 MIL/uL — ABNORMAL LOW (ref 4.22–5.81)
RDW: 14.4 % (ref 11.5–15.5)
WBC: 11 10*3/uL — ABNORMAL HIGH (ref 4.0–10.5)
nRBC: 0 % (ref 0.0–0.2)

## 2023-10-13 MED ORDER — LIDOCAINE HCL (PF) 1 % IJ SOLN: 5.0000 mL | INTRAMUSCULAR | Status: DC | PRN

## 2023-10-13 MED ORDER — HEPARIN SODIUM (PORCINE) 1000 UNIT/ML IJ SOLN
INTRAMUSCULAR | Status: AC
  Filled 2023-10-13: qty 10

## 2023-10-13 MED ORDER — OXYCODONE HCL 5 MG PO TABS
10.0000 mg | ORAL_TABLET | ORAL | Status: DC | PRN
  Administered 2023-10-13 – 2023-10-14 (×2): 10 mg via ORAL
  Filled 2023-10-13 (×2): qty 2

## 2023-10-13 MED ORDER — HEPARIN SODIUM (PORCINE) 1000 UNIT/ML DIALYSIS
1000.0000 [IU] | INTRAMUSCULAR | Status: DC | PRN
  Administered 2023-10-13: 4000 [IU]

## 2023-10-13 MED ORDER — ANTICOAGULANT SODIUM CITRATE 4% (200MG/5ML) IV SOLN
5.0000 mL | Status: DC | PRN
  Filled 2023-10-13: qty 5

## 2023-10-13 MED ORDER — PENTAFLUOROPROP-TETRAFLUOROETH EX AERO: 1.0000 | INHALATION_SPRAY | CUTANEOUS | Status: DC | PRN

## 2023-10-13 MED ORDER — LIDOCAINE-PRILOCAINE 2.5-2.5 % EX CREA: 1.0000 | TOPICAL_CREAM | CUTANEOUS | Status: DC | PRN

## 2023-10-13 MED ORDER — ALTEPLASE 2 MG IJ SOLR: 2.0000 mg | Freq: Once | INTRAMUSCULAR | Status: DC | PRN

## 2023-10-13 NOTE — Care Management Important Message (Signed)
 Important Message  Patient Details  Name: Darrell Walls MRN: [id number] Date of Birth: 07/30/1990   Important Message Given:  Yes - Medicare IM     Anise Kerns 10/13/2023, 12:52 PM

## 2023-10-13 NOTE — Progress Notes (Signed)
 Progress Note    10/13/2023 9:18 PM * No surgery found *  Subjective:  32 yom with ESRD on HD via RIGHT internal jugular permacath- M/W/F; s/p LEFT radiocephalic AV fistula placement 60/4540; the fistula has failed to mature- he has had 2 endovascular interventions; including a thrombectomy, angioplasty and stenting 10/08/2023. He presented to the ED yesterday with progressive LEFT arm pain, swelling and redness. Denies drainage from access site. Denies hand pain, numbness or weakness. Tolerated HD on Friday   This morning on exam the patient complains of pain from his elbow to his fingers.  He states that his hands and his fingers are burning.  Versus it is hard to bend his wrist and his fingers.  No other complaints overnight.  Vitals all remained stable   Vitals:   10/13/23 1832 10/13/23 2015  BP: (!) 198/86 (!) 186/89  Pulse:  73  Resp: 16 18  Temp: 97.7 F (36.5 C) 98.7 F (37.1 C)  SpO2: 100% 99%   Physical Exam: Cardiac:  RRR, normal S1 and S2, no rubs clicks gallops or murmurs to note. Lungs: Clear on auscultation throughout, nonlabored breathing, no rales rhonchi or wheezing. Incisions: Incision to left arm AV graft site well-healed.  Catheter insertion in left arm for prior AV fistulogram is well-healed. Extremities: Palpable pulses throughout.  Left upper extremity with slight edema and swelling. Abdomen: Positive bowel sounds throughout, soft, nontender and nondistended. Neurologic: Alert and oriented x 4, answers all questions and follows commands appropriately  CBC    Component Value Date/Time   WBC 11.0 (H) 10/13/2023 1400   RBC 3.19 (L) 10/13/2023 1400   HGB 9.7 (L) 10/13/2023 1400   HCT 29.2 (L) 10/13/2023 1400   PLT 247 10/13/2023 1400   MCV 91.5 10/13/2023 1400   MCH 30.4 10/13/2023 1400   MCHC 33.2 10/13/2023 1400   RDW 14.4 10/13/2023 1400   LYMPHSABS 1.7 10/12/2023 0034   MONOABS 1.2 (H) 10/12/2023 0034   EOSABS 0.9 (H) 10/12/2023 0034   BASOSABS  0.0 10/12/2023 0034    BMET    Component Value Date/Time   NA 134 (L) 10/13/2023 1400   K 5.0 10/13/2023 1400   CL 98 10/13/2023 1400   CO2 19 (L) 10/13/2023 1400   GLUCOSE 83 10/13/2023 1400   BUN 83 (H) 10/13/2023 1400   CREATININE 13.31 (H) 10/13/2023 1400   CALCIUM  7.9 (L) 10/13/2023 1400   GFRNONAA 5 (L) 10/13/2023 1400   GFRAA >60 09/23/2019 0958    INR    Component Value Date/Time   INR 1.2 10/05/2023 1728     Intake/Output Summary (Last 24 hours) at 10/13/2023 2118 Last data filed at 10/13/2023 1755 Gross per 24 hour  Intake 0 ml  Output 2200 ml  Net -2200 ml     Assessment/Plan:  33 y.o. male is s/p LEFT radiocephalic AV fistula placement 98/1191; the fistula has failed to mature- he has had 2 endovascular interventions; including a thrombectomy, angioplasty and stenting 10/08/2023. He presented to the ED yesterday with progressive LEFT arm pain, swelling and redness. Denies drainage from access site.  * No surgery found *   In consultation with Dr. Mikki Alexander MD we reviewed the patient's ultrasounds this morning.  According to Dr. Mikki Alexander this AV fistula has failed to mature and after having 2 previous endovascular interventions with recurrent thrombectomy this fistula will never be functional.  Patient already has permacatheter placed for outpatient dialysis and he can use this going forward.  Patient  is also noted to be on the kidney transplant list for transplantation of a kidney next month.  Therefore at this time there is no intervention needed to resolve a nonfunctioning AV graft to his left upper arm.  DVT prophylaxis: Heparin  with dialysis   Annamaria Barrette [facility] 10/13/2023 9:18 PM

## 2023-10-13 NOTE — Progress Notes (Signed)
 [facility]  ROUNDING NOTE   Subjective:   Darrell Walls is a 33 y.o. male with past medical history of hypertension, sleep apnea on CPAP, anemia, and end-stage renal disease on hemodialysis.  Patient presents to the emergency department complaining of redness and swelling at fistula site and has been admitted for AV fistula infection, initial encounter (HCC) Clemente.[id number]] Postoperative infection, unspecified type, initial encounter [T81.40XA]  Patient is known our practice and receives outpatient dialysis treatments at [facility] on a MWF schedule, supervised by Dr. Zelda Hickman.  Patient was recently admitted and treated for DKA, discharged on 5/14.  Last dialysis treatment was received on Friday.  Patient seen resting quietly in dark room.  No family present.  Patient states he has began having pain and swelling since fistulogram on 5/14.  AV fistula was placed on 07/17/2023.  Patient underwent thrombectomy during that procedure with stent placement.  Remains on room air with no other concerns.  Labs on ED arrival unremarkable for renal patient.  White count 11.0 with hemoglobin 9.5.  Blood cultures pending and negative thus far.  Dialysis access ultrasound shows a patent fistula.  We have been consulted to manage dialysis needs during this admission.   Objective:  Vital signs in last 24 hours:  Temp:  [97.7 F (36.5 C)-99.1 F (37.3 C)] 97.7 F (36.5 C) (05/19 0757) Pulse Rate:  [71] 71 (05/19 0757) Resp:  [16-17] 17 (05/19 0757) BP: (159-179)/(93-96) 179/96 (05/19 0757) SpO2:  [97 %-100 %] 99 % (05/19 0757)  Weight change:  Filed Weights   10/12/23 0037 10/12/23 0320  Weight: 79.5 kg 80.8 kg    Intake/Output: I/O last 3 completed shifts: In: 673.2 [P.O.:480; IV Piggyback:193.2] Out: -    Intake/Output this shift:  No intake/output data recorded.  Physical Exam: General: NAD  Head: Normocephalic, atraumatic. Moist oral mucosal membranes  Eyes: Anicteric   Lungs:  Clear to auscultation,normal effort  Heart: Regular rate and rhythm  Abdomen:  Soft, nontender  Extremities:  No peripheral edema.  Neurologic: Alert  Skin: No lesions  Access: Rt permcath, aVF ( bruit or thrill)    Basic Metabolic Panel: Recent Labs  Lab 10/06/23 1507 10/06/23 2150 10/07/23 0552 10/08/23 0621 10/12/23 0034  NA 136 134* 138 136 137  K 3.8 3.6 3.5 3.7 3.9  CL 97* 96* 97* 99 99  CO2 19* 21* 23 20* 22  GLUCOSE 152* 122* 128* 104* 152*  BUN 59* 65* 61* 73* 54*  CREATININE 12.12* 13.18* 11.83* 14.76* 9.80*  CALCIUM  9.0 8.9 9.1 7.7* 7.8*    Liver Function Tests: Recent Labs  Lab 10/12/23 0034  AST 27  ALT 17  ALKPHOS 110  BILITOT 0.7  PROT 7.6  ALBUMIN 3.9   No results for input(s): "LIPASE", "AMYLASE" in the last 168 hours.  No results for input(s): "AMMONIA" in the last 168 hours.  CBC: Recent Labs  Lab 10/08/23 0852 10/12/23 0034  WBC 10.7* 11.0*  NEUTROABS  --  7.2  HGB 10.8* 9.5*  HCT 32.3* 29.4*  MCV 90.0 93.0  PLT 266 225    Cardiac Enzymes: No results for input(s): "CKTOTAL", "CKMB", "CKMBINDEX", "TROPONINI" in the last 168 hours.  BNP: Invalid input(s): "POCBNP"  CBG: Recent Labs  Lab 10/12/23 1152 10/12/23 1648 10/12/23 2043 10/12/23 2253 10/13/23 0827  GLUCAP 174* 158* 94 81 71    Microbiology: Results for orders placed or performed during the hospital encounter of 10/12/23  Blood culture (routine x 2)  Status: None (Preliminary result)   Collection Time: 10/12/23  1:56 AM   Specimen: BLOOD  Result Value Ref Range Status   Specimen Description BLOOD BLOOD RIGHT ARM  Final   Special Requests   Final    BOTTLES DRAWN AEROBIC AND ANAEROBIC Blood Culture results may not be optimal due to an inadequate volume of blood received in culture bottles   Culture   Final    NO GROWTH 1 DAY Performed at [facility], [address], [place], [place] [address]    Report Status PENDING  Incomplete   Blood culture (routine x 2)     Status: None (Preliminary result)   Collection Time: 10/12/23  1:56 AM   Specimen: BLOOD  Result Value Ref Range Status   Specimen Description BLOOD BLOOD LEFT ARM  Final   Special Requests   Final    BOTTLES DRAWN AEROBIC AND ANAEROBIC Blood Culture results may not be optimal due to an inadequate volume of blood received in culture bottles   Culture   Final    NO GROWTH 1 DAY Performed at [facility], [address], [place], [place] [address]    Report Status PENDING  Incomplete    Coagulation Studies: No results for input(s): "LABPROT", "INR" in the last 72 hours.   Urinalysis: No results for input(s): "COLORURINE", "LABSPEC", "PHURINE", "GLUCOSEU", "HGBUR", "BILIRUBINUR", "KETONESUR", "PROTEINUR", "UROBILINOGEN", "NITRITE", "LEUKOCYTESUR" in the last 72 hours.  Invalid input(s): "APPERANCEUR"    Imaging: US  Dialysis Access Result Date: 10/12/2023 CLINICAL DATA:  Pain and swelling, previous radiocephalic thrombectomy and stent placement EXAM: ULTRASOUND DIALYSIS ACCESS COMPARISON:  10/05/2023 FINDINGS: The afferent native radial artery is patent with normal low resistance arterial waveform. The radiocephalic fistula is patent. Low velocity low resistance waveform in the cephalic vein just central to the fistula. There is occlusion of the outflow cephalic vein just peripheral to the stent. The stent is occluded through its length. The more central cephalic vein is occluded for 2.6 cm central to the stent. IMPRESSION: Patent radiocephalic fistula. 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 Electronically Signed   By: Nicoletta Barrier M.D.   On: 10/12/2023 14:19      Medications:    cefTRIAXone  (ROCEPHIN )  IV 1 g (10/13/23 0541)   vancomycin       atorvastatin   20 mg Oral Daily   carvedilol   25 mg Oral BID WC   Chlorhexidine  Gluconate Cloth  6 each Topical Q0600   cloNIDine   0.3 mg Oral TID   furosemide   80  mg Oral BID   heparin   5,000 Units Subcutaneous Q8H   insulin  aspart  0-5 Units Subcutaneous QHS   insulin  aspart  0-6 Units Subcutaneous TID WC   insulin  glargine-yfgn  6 Units Subcutaneous QHS   irbesartan   300 mg Oral Daily   sertraline   50 mg Oral Daily   acetaminophen  **OR** acetaminophen , morphine  injection, ondansetron  **OR** ondansetron  (ZOFRAN ) IV, oxyCODONE   Assessment/ Plan:  Mr. Darrell Walls is a 33 y.o.  male with past medical history of hypertension, sleep apnea on CPAP, anemia, and end-stage renal disease on hemodialysis.  Patient presents to the emergency department complaining of abdominal pain and vomiting and has been admitted for AV fistula infection, initial encounter (HCC) Clemente.[id number].[id number]] Postoperative infection, unspecified type, initial encounter [T81.40XA]  [facility]/MWF/Rt permcath  End-stage renal disease on hemodialysis.  Last treatment completed on Friday.  Scheduled for dialysis later today.  2. Diabetes mellitus type II with  chronic kidney disease/renal manifestations: DKA during previous admission.  Insulin  dependent. Home regimen includes Lantus  and lispro. Most recent hemoglobin A1c is 8.8 in 2023. No outpatient SLGT-2 inhibitors.  Primary team to manage sliding scale insulin .  3.  Hypertensive urgency.  Home regimen includes furosemide , amlodipine , carvedilol , clonidine , doxazosin , hydralazine , and irbesartan . Blood pressure 179/96.  Only receiving carvedilol , clonidine , furosemide , and irbesartan .  4. Anemia of chronic kidney disease/ kidney injury/chronic disease/acute blood loss:  Lab Results  Component Value Date   HGB 9.5 (L) 10/12/2023    Hemoglobin remains within acceptable range.  5.  Infection, AV fistula.  Elevated white count, edema.  Primary team has ordered Rocephin  and vancomycin  with dialysis.   LOS: 1 Rafiq Bucklin 5/19/202511:03 AM

## 2023-10-13 NOTE — Plan of Care (Signed)
   Problem: Education: Goal: Ability to describe self-care measures that may prevent or decrease complications (Diabetes Survival Skills Education) will improve Outcome: Progressing Goal: Individualized Educational Video(s) Outcome: Progressing   Problem: Coping: Goal: Ability to adjust to condition or change in health will improve Outcome: Progressing

## 2023-10-13 NOTE — TOC Initial Note (Signed)
 Transition of Care [facility]) - Initial/Assessment Note    Patient Details  Name: Darrell Walls MRN: [id number] Date of Birth: 09/15/1990  Transition of Care [facility]) CM/SW Contact:    Odilia Bennett, LCSW Phone Number: 10/13/2023, 12:09 PM  Clinical Narrative:  Readmission prevention screen complete. CSW met with patient. No family at bedside. CSW introduced role and explained that discharge planning would be discussed. PCP is Theda Finical, MD. Mom or uncle transport him to appointments and HD. Pharmacy is [facility]. He reports issues obtaining his Dexcom supplies because of insurance issues. Pharmacy is aware. Patient stated he was receiving home health services through [facility] but liaison stated they have never been open with him. No other home health agencies listed in the chart or Patient Ping. Tried calling patient to notify but no answer. No DME use prior to admission. No further concerns. CSW will continue to follow patient for support and facilitate return home once stable. Mom will transport him home at discharge.                Expected Discharge Plan: Home/Self Care Barriers to Discharge: Continued Medical Work up   Patient Goals and CMS Choice            Expected Discharge Plan and Services     Post Acute Care Choice: NA Living arrangements for the past 2 months: Single Family Home                                      Prior Living Arrangements/Services Living arrangements for the past 2 months: Single Family Home Lives with:: Parents Patient language and need for interpreter reviewed:: Yes Do you feel safe going back to the place where you live?: Yes      Need for Family Participation in Patient Care: Yes (Comment) Care giver support system in place?: Yes (comment)   Criminal Activity/Legal Involvement Pertinent to Current Situation/Hospitalization: No - Comment as needed  Activities of Daily Living   ADL Screening (condition at time  of admission) Independently performs ADLs?: Yes (appropriate for developmental age) Is the patient deaf or have difficulty hearing?: No Does the patient have difficulty seeing, even when wearing glasses/contacts?: No Does the patient have difficulty concentrating, remembering, or making decisions?: No  Permission Sought/Granted                  Emotional Assessment Appearance:: Appears stated age Attitude/Demeanor/Rapport: Engaged, Gracious Affect (typically observed): Accepting, Appropriate, Calm, Pleasant Orientation: : Oriented to Self, Oriented to Place, Oriented to  Time, Oriented to Situation Alcohol / Substance Use: Not Applicable Psych Involvement: No (comment)  Admission diagnosis:  AV fistula infection, initial encounter (HCC) [N62.7XXA] Postoperative infection, unspecified type, initial encounter [T81.40XA] Patient Active Problem List   Diagnosis Date Noted   HTN (hypertension) 10/12/2023   Dialysis AV fistula infection, initial encounter (HCC) 10/12/2023   AV fistula infection, initial encounter (HCC) 10/12/2023   Intractable vomiting with nausea 10/06/2023   Pain in left arm 10/06/2023   AV fistula thrombosis, subsequent encounter 10/06/2023   SIRS (systemic inflammatory response syndrome) (HCC) 10/06/2023   End stage renal disease on dialysis (HCC) 10/05/2023   Diabetes mellitus type 2, insulin  dependent (HCC) 02/22/2023   OSA on CPAP 10/26/2022   Ketoacidosis 05/16/2022   DKA, type 2 (HCC) 05/15/2022   Acute renal failure superimposed on stage 4 chronic kidney disease (HCC) 05/15/2022  Anemia due to chronic kidney disease 05/15/2022   Hypertensive urgency 05/15/2022   Abdominal pain 05/15/2022   Vomiting 05/15/2022   Acute urinary retention 05/15/2022   Urinary tract infection 05/15/2022   DKA, type 1 (HCC) 05/15/2022   Nicotine dependence, unspecified, uncomplicated 12/07/2019   Adjustment disorder with mixed disturbance of emotions and conduct  02/20/2018   Diabetes (HCC) 02/20/2018   Cannabis use disorder, mild, abuse 02/20/2018   Ibuprofen  overdose 02/20/2018   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 [place] [address] Phone: [phone] Fax: [phone]  [id number] [address] ([address]),  - [address] [place] ([address]) [place] [address] Phone: [phone] Fax: [phone]     Social Drivers of Health (SDOH) Social History: SDOH Screenings   Food Insecurity: No Food Insecurity (10/12/2023)  Housing: Low Risk  (10/12/2023)  Transportation Needs: No Transportation Needs (10/12/2023)  Utilities: Not At Risk (10/12/2023)  Financial Resource Strain: Low Risk  (12/19/2022)   Received from [facility]  Recent Concern: Financial Resource Strain - High Risk (10/22/2022)   [facility]  Social Connections: Unknown (02/03/2023)   Received from [facility]  Stress: No Stress Concern Present (03/04/2023)   Received from [facility]  Tobacco Use: Medium Risk (10/12/2023)   SDOH Interventions:     Readmission Risk Interventions    10/13/2023   12:00 PM 05/17/2022    3:45 PM  Readmission Risk Prevention Plan  Transportation Screening Complete Complete  PCP or Specialist Appt within 3-5 Days Complete Complete  Social Work Consult for Recovery Care Planning/Counseling Complete   Palliative Care Screening Not Applicable Not Applicable  Medication Review Oceanographer) Complete Referral to Pharmacy

## 2023-10-13 NOTE — Progress Notes (Signed)
 Received patient in bed.Alert and oriented x 4. Consent verified.  Access used: Right hd catheter that worked well.Dressing change done today.  Duration of treatment : 3.5 hours.  Uf goal: Met 2 liters.Tolerated treatment.  Medicine given : Vancomycin  750 mg= 150 cc.  Hand off to the patient's nurse into his room via transporter with stable condition.

## 2023-10-13 NOTE — Plan of Care (Signed)

## 2023-10-13 NOTE — Progress Notes (Signed)
 PROGRESS NOTE    Darrell Walls  ZOX:[id number] DOB: 04-22-1991 DOA: 10/12/2023 PCP: Theda Finical, MD    Assessment & Plan:   Principal Problem:   Dialysis AV fistula infection, initial encounter (HCC) Active Problems:   End stage renal disease on dialysis (HCC)   Anemia due to chronic kidney disease   Diabetes mellitus type 2, insulin  dependent (HCC)   HTN (hypertension)   AV fistula infection, initial encounter (HCC)  Assessment and Plan: Dialysis AV fistula infection: continue on IV rocephin , vanco. US  vasc access showed occluded AV fistula so the fistula is non-functional as per vasc surg. Increased oxy secondary to pain being poorly controlled. Vasc surg following and recs apprec    ESRD: on HD MWF. Nephro following and recs apprec    ACD: secondary to ESRD. No need for a transfusion currently    HTN: continue on coreg , irbesartan , clonidine     DM2: well controlled, HbA1c 6.0. Continue on glargine, SSI w/ accuchecks   Depression: severity unknown. Continue on home dose of sertraline     DVT prophylaxis: heparin   Code Status: full  Family Communication:  Disposition Plan: likely d/c back home  Level of care: Med-Surg Status is: Inpatient Remains inpatient appropriate because: severity of illness    Consultants:  Vasc surg   Procedures:   Antimicrobials: vanco, rocephin    Subjective: Pt c/o arm pain   Objective: Vitals:   10/13/23 1346 10/13/23 1408 10/13/23 1430 10/13/23 1500  BP: (!) 192/100  (!) 173/89 (!) 182/98  Pulse: 74 71 69 68  Resp: (!) 9 12 10 20   Temp: 98.1 F (36.7 C)     TempSrc: Oral     SpO2:   100% 100%  Weight:      Height:        Intake/Output Summary (Last 24 hours) at 10/13/2023 1510 Last data filed at 10/13/2023 0900 Gross per 24 hour  Intake 0 ml  Output --  Net 0 ml   Filed Weights   10/12/23 0037 10/12/23 0320 10/13/23 1344  Weight: 79.5 kg 80.8 kg 79.8 kg    Examination:  General exam: Appears comfortable   Respiratory system: clear breath sounds b/l  Cardiovascular system: S1/S2+. No rubs or clicks  Gastrointestinal system: abd is soft, NT, ND & normal bowel sounds  Central nervous system: alert & oriented. Moves all extremities  Psychiatry: Judgement and insight appears normal. Flat mood and affect     Data Reviewed: I have personally reviewed following labs and imaging studies  CBC: Recent Labs  Lab 10/08/23 0852 10/12/23 0034 10/13/23 1400  WBC 10.7* 11.0* 11.0*  NEUTROABS  --  7.2  --   HGB 10.8* 9.5* 9.7*  HCT 32.3* 29.4* 29.2*  MCV 90.0 93.0 91.5  PLT 266 225 247   Basic Metabolic Panel: Recent Labs  Lab 10/06/23 2150 10/07/23 0552 10/08/23 0621 10/12/23 0034 10/13/23 1400  NA 134* 138 136 137 134*  K 3.6 3.5 3.7 3.9 5.0  CL 96* 97* 99 99 98  CO2 21* 23 20* 22 19*  GLUCOSE 122* 128* 104* 152* 83  BUN 65* 61* 73* 54* 83*  CREATININE 13.18* 11.83* 14.76* 9.80* 13.31*  CALCIUM  8.9 9.1 7.7* 7.8* 7.9*  PHOS  --   --   --   --  10.2*   GFR: Estimated Creatinine Clearance: 8.6 mL/min (A) (by C-G formula based on SCr of 13.31 mg/dL (H)). Liver Function Tests: Recent Labs  Lab 10/12/23 0034 10/13/23 1400  AST 27  --  ALT 17  --   ALKPHOS 110  --   BILITOT 0.7  --   PROT 7.6  --   ALBUMIN 3.9 3.1*   No results for input(s): "LIPASE", "AMYLASE" in the last 168 hours.  No results for input(s): "AMMONIA" in the last 168 hours. Coagulation Profile: No results for input(s): "INR", "PROTIME" in the last 168 hours.  Cardiac Enzymes: No results for input(s): "CKTOTAL", "CKMB", "CKMBINDEX", "TROPONINI" in the last 168 hours. BNP (last 3 results) No results for input(s): "PROBNP" in the last 8760 hours. HbA1C: No results for input(s): "HGBA1C" in the last 72 hours. CBG: Recent Labs  Lab 10/12/23 1648 10/12/23 2043 10/12/23 2253 10/13/23 0827 10/13/23 1157  GLUCAP 158* 94 81 71 82   Lipid Profile: No results for input(s): "CHOL", "HDL", "LDLCALC",  "TRIG", "CHOLHDL", "LDLDIRECT" in the last 72 hours. Thyroid Function Tests: No results for input(s): "TSH", "T4TOTAL", "FREET4", "T3FREE", "THYROIDAB" in the last 72 hours. Anemia Panel: No results for input(s): "VITAMINB12", "FOLATE", "FERRITIN", "TIBC", "IRON", "RETICCTPCT" in the last 72 hours. Sepsis Labs: Recent Labs  Lab 10/12/23 0156 10/12/23 0713  LATICACIDVEN 0.9 0.6    Recent Results (from the past 240 hours)  Culture, blood (Routine x 2)     Status: None   Collection Time: 10/05/23  5:28 PM   Specimen: BLOOD RIGHT ARM  Result Value Ref Range Status   Specimen Description BLOOD RIGHT ARM  Final   Special Requests   Final    BOTTLES DRAWN AEROBIC AND ANAEROBIC Blood Culture adequate volume   Culture   Final    NO GROWTH 5 DAYS Performed at [facility], [address] [place], [place] [address]    Report Status 10/10/2023 FINAL  Final  Resp panel by RT-PCR (RSV, Flu A&B, Covid) Anterior Nasal Swab     Status: None   Collection Time: 10/05/23  8:38 PM   Specimen: Anterior Nasal Swab  Result Value Ref Range Status   SARS Coronavirus 2 by RT PCR NEGATIVE NEGATIVE Final    Comment: (NOTE) SARS-CoV-2 target nucleic acids are NOT DETECTED.  The SARS-CoV-2 RNA is generally detectable in upper respiratory specimens during the acute phase of infection. The lowest concentration of SARS-CoV-2 viral copies this assay can detect is 138 copies/mL. A negative result does not preclude SARS-Cov-2 infection and should not be used as the sole basis for treatment or other patient management decisions. A negative result may occur with  improper specimen collection/handling, submission of specimen other than nasopharyngeal swab, presence of viral mutation(s) within the areas targeted by this assay, and inadequate number of viral copies(<138 copies/mL). A negative result must be combined with clinical observations, patient history, and epidemiological information. The  expected result is Negative.  Fact Sheet for Patients:  BloggerCourse.com  Fact Sheet for Healthcare Providers:  SeriousBroker.it  This test is no t yet approved or cleared by the [place]  FDA and  has been authorized for detection and/or diagnosis of SARS-CoV-2 by FDA under an Emergency Use Authorization (EUA). This EUA will remain  in effect (meaning this test can be used) for the duration of the COVID-19 declaration under Section 564(b)(1) of the Act, 21 U.S.C.section 360bbb-3(b)(1), unless the authorization is terminated  or revoked sooner.       Influenza A by PCR NEGATIVE NEGATIVE Final   Influenza B by PCR NEGATIVE NEGATIVE Final    Comment: (NOTE) The Xpert Xpress SARS-CoV-2/FLU/RSV plus assay is intended as an aid in the diagnosis of influenza  from Nasopharyngeal swab specimens and should not be used as a sole basis for treatment. Nasal washings and aspirates are unacceptable for Xpert Xpress SARS-CoV-2/FLU/RSV testing.  Fact Sheet for Patients: BloggerCourse.com  Fact Sheet for Healthcare Providers: SeriousBroker.it  This test is not yet approved or cleared by the [place]  FDA and has been authorized for detection and/or diagnosis of SARS-CoV-2 by FDA under an Emergency Use Authorization (EUA). This EUA will remain in effect (meaning this test can be used) for the duration of the COVID-19 declaration under Section 564(b)(1) of the Act, 21 U.S.C. section 360bbb-3(b)(1), unless the authorization is terminated or revoked.     Resp Syncytial Virus by PCR NEGATIVE NEGATIVE Final    Comment: (NOTE) Fact Sheet for Patients: BloggerCourse.com  Fact Sheet for Healthcare Providers: SeriousBroker.it  This test is not yet approved or cleared by the [place]  FDA and has been authorized for detection and/or  diagnosis of SARS-CoV-2 by FDA under an Emergency Use Authorization (EUA). This EUA will remain in effect (meaning this test can be used) for the duration of the COVID-19 declaration under Section 564(b)(1) of the Act, 21 U.S.C. section 360bbb-3(b)(1), unless the authorization is terminated or revoked.  Performed at [facility], [address] [place], [place] [address]   MRSA Next Gen by PCR, Nasal     Status: None   Collection Time: 10/06/23  1:41 AM   Specimen: Nasal Mucosa; Nasal Swab  Result Value Ref Range Status   MRSA by PCR Next Gen NOT DETECTED NOT DETECTED Final    Comment: (NOTE) The GeneXpert MRSA Assay (FDA approved for NASAL specimens only), is one component of a comprehensive MRSA colonization surveillance program. It is not intended to diagnose MRSA infection nor to guide or monitor treatment for MRSA infections. Test performance is not FDA approved in patients less than 51 years old. Performed at [facility], [address] [place], [place] [address]   Culture, blood (Routine X 2) w Reflex to ID Panel     Status: None   Collection Time: 10/06/23  5:03 AM   Specimen: BLOOD  Result Value Ref Range Status   Specimen Description BLOOD BLOOD RIGHT ARM  Final   Special Requests Blood Culture adequate volume  Final   Culture   Final    NO GROWTH 5 DAYS Performed at [facility], [address] [place], [place] [address]    Report Status 10/11/2023 FINAL  Final  Blood culture (routine x 2)     Status: None (Preliminary result)   Collection Time: 10/12/23  1:56 AM   Specimen: BLOOD  Result Value Ref Range Status   Specimen Description BLOOD BLOOD RIGHT ARM  Final   Special Requests   Final    BOTTLES DRAWN AEROBIC AND ANAEROBIC Blood Culture results may not be optimal due to an inadequate volume of blood received in culture bottles   Culture   Final    NO GROWTH 1 DAY Performed at [facility], [address]  [place], [place] [address]    Report Status PENDING  Incomplete  Blood culture (routine x 2)     Status: None (Preliminary result)   Collection Time: 10/12/23  1:56 AM   Specimen: BLOOD  Result Value Ref Range Status   Specimen Description BLOOD BLOOD LEFT ARM  Final   Special Requests   Final    BOTTLES DRAWN AEROBIC AND ANAEROBIC Blood Culture results may not be optimal due to an inadequate volume  of blood received in culture bottles   Culture   Final    NO GROWTH 1 DAY Performed at [facility], [address] [place], [place] [address]    Report Status PENDING  Incomplete         Radiology Studies: US  Dialysis Access Result Date: 10/12/2023 CLINICAL DATA:  Pain and swelling, previous radiocephalic thrombectomy and stent placement EXAM: ULTRASOUND DIALYSIS ACCESS COMPARISON:  10/05/2023 FINDINGS: The afferent native radial artery is patent with normal low resistance arterial waveform. The radiocephalic fistula is patent. Low velocity low resistance waveform in the cephalic vein just central to the fistula. There is occlusion of the outflow cephalic vein just peripheral to the stent. The stent is occluded through its length. The more central cephalic vein is occluded for 2.6 cm central to the stent. IMPRESSION: Patent radiocephalic fistula. 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 Electronically Signed   By: Nicoletta Barrier M.D.   On: 10/12/2023 14:19        Scheduled Meds:  atorvastatin   20 mg Oral Daily   carvedilol   25 mg Oral BID WC   Chlorhexidine  Gluconate Cloth  6 each Topical Q0600   cloNIDine   0.3 mg Oral TID   furosemide   80 mg Oral BID   heparin   5,000 Units Subcutaneous Q8H   insulin  aspart  0-5 Units Subcutaneous QHS   insulin  aspart  0-6 Units Subcutaneous TID WC   insulin  glargine-yfgn  6 Units Subcutaneous QHS   irbesartan   300 mg Oral Daily   sertraline   50 mg Oral Daily   Continuous Infusions:  anticoagulant sodium  citrate     cefTRIAXone  (ROCEPHIN )  IV 1 g (10/13/23 0541)   vancomycin        LOS: 1 day      Alphonsus Jeans, MD [facility] Pager [phone]  If 7PM-7AM, please contact night-coverage [email or web address] 10/13/2023, 3:10 PM

## 2023-10-14 ENCOUNTER — Encounter (INDEPENDENT_AMBULATORY_CARE_PROVIDER_SITE_OTHER): Payer: Self-pay

## 2023-10-14 DIAGNOSIS — T827XXA Infection and inflammatory reaction due to other cardiac and vascular devices, implants and grafts, initial encounter: Secondary | ICD-10-CM | POA: Diagnosis not present

## 2023-10-14 LAB — CBC
HCT: 31.4 % — ABNORMAL LOW (ref 39.0–52.0)
Hemoglobin: 10.3 g/dL — ABNORMAL LOW (ref 13.0–17.0)
MCH: 29.3 pg (ref 26.0–34.0)
MCHC: 32.8 g/dL (ref 30.0–36.0)
MCV: 89.2 fL (ref 80.0–100.0)
Platelets: 238 10*3/uL (ref 150–400)
RBC: 3.52 MIL/uL — ABNORMAL LOW (ref 4.22–5.81)
RDW: 14 % (ref 11.5–15.5)
WBC: 8 10*3/uL (ref 4.0–10.5)
nRBC: 0 % (ref 0.0–0.2)

## 2023-10-14 LAB — BASIC METABOLIC PANEL WITH GFR
Anion gap: 10 (ref 5–15)
BUN: 40 mg/dL — ABNORMAL HIGH (ref 6–20)
CO2: 25 mmol/L (ref 22–32)
Calcium: 8 mg/dL — ABNORMAL LOW (ref 8.9–10.3)
Chloride: 98 mmol/L (ref 98–111)
Creatinine, Ser: 8.15 mg/dL — ABNORMAL HIGH (ref 0.61–1.24)
GFR, Estimated: 8 mL/min — ABNORMAL LOW (ref >60)
Glucose, Bld: 72 mg/dL (ref 70–99)
Potassium: 4.1 mmol/L (ref 3.5–5.1)
Sodium: 133 mmol/L — ABNORMAL LOW (ref 135–145)

## 2023-10-14 LAB — HEPATITIS B SURFACE ANTIGEN: Hepatitis B Surface Ag: NONREACTIVE

## 2023-10-14 LAB — GLUCOSE, CAPILLARY
Glucose-Capillary: 60 mg/dL — ABNORMAL LOW (ref 70–99)
Glucose-Capillary: 88 mg/dL (ref 70–99)
Glucose-Capillary: 105 mg/dL — ABNORMAL HIGH (ref 70–99)
Glucose-Capillary: 103 mg/dL — ABNORMAL HIGH (ref 70–99)

## 2023-10-14 MED ORDER — DEXCOM G7 SENSOR MISC
0 refills | Status: AC
Start: 2023-10-14 — End: ?

## 2023-10-14 MED ORDER — INSULIN GLARGINE-YFGN 100 UNIT/ML ~~LOC~~ SOLN: 3.0000 [IU] | Freq: Every day | SUBCUTANEOUS | Status: DC

## 2023-10-14 MED ORDER — VANCOMYCIN HCL 750 MG/150ML IV SOLN: 750.0000 mg | INTRAVENOUS | Status: AC

## 2023-10-14 MED ORDER — CEFPODOXIME PROXETIL 200 MG PO TABS: 200.0000 mg | ORAL_TABLET | ORAL | 0 refills | Status: AC

## 2023-10-14 MED ORDER — OXYCODONE HCL 10 MG PO TABS: 10.0000 mg | ORAL_TABLET | Freq: Four times a day (QID) | ORAL | 0 refills | Status: AC | PRN

## 2023-10-14 NOTE — Inpatient Diabetes Management (Signed)
 Inpatient Diabetes Program Recommendations  AACE/ADA: New Consensus Statement on Inpatient Glycemic Control (2015)  Target Ranges:  Prepandial:   less than 140 mg/dL      Peak postprandial:   less than 180 mg/dL (1-2 hours)      Critically ill patients:  140 - 180 mg/dL    Latest Reference Range & Units 10/13/23 08:27 10/13/23 11:57 10/13/23 18:27 10/13/23 18:59 10/13/23 21:21 10/13/23 21:28 10/14/23 04:31 10/14/23 04:53  Glucose-Capillary 70 - 99 mg/dL 71 82 65 (L) 76 324 (H)  Semglee  6 units @21 :24 113 (H) 60 (L) 88   Review of Glycemic Control  Diabetes history: DM2 Outpatient Diabetes medications: Lantus  12 units QPM, Humalog 2-4 units TID with meals; Dexcom G7 CGM Current orders for Inpatient glycemic control: Semglee  6 units at bedtime, Novolog  0-6 units TID with meals, Novolog  0-5 units QHS  Inpatient Diabetes Program Recommendations:    Insulin : CBG 60 mg/dl today at 4:01 am. Please consider decreasing Semglee  to 3 units at bedtime.  NOTE: Patient admitted 10/12/23 with AV fistula infection. Patient has ESRD on dialysis. Patient was recently inpatient 5/12-5/14/25 with ketoacidosis (euglycemic DKA versus starvation ketosis). Inpatient diabetes coordinator talked with patient's mother at his bedside on 5/12 and 10/07/23 during that admission as patient would not wake up enough during either visit to engage in conversation.   Thanks, Beacher Limerick, RN, MSN, CDCES Diabetes Coordinator Inpatient Diabetes Program [phone] (Team Pager from 8am to 5pm)

## 2023-10-14 NOTE — Plan of Care (Signed)

## 2023-10-14 NOTE — Discharge Summary (Signed)
 Physician Discharge Summary  Darrell Walls GNF:[id number] DOB: 06-07-1990 DOA: 10/12/2023  PCP: Jeyaraju, Maniraj, MD  Admit date: 10/12/2023 Discharge date: 10/14/2023  Admitted From: home  Disposition:  home   Recommendations for Outpatient Follow-up:  Follow up with PCP in 1-2 weeks F/u w/ nephro, Dr. Zelda Walls, in 1-2 weeks   Home Health: Equipment/Devices:  Discharge Condition: stable  CODE STATUS: full  Diet recommendation: Heart Healthy / Carb Modified   Brief/Interim Summary: HPI was taken from Dr. Vallarie Walls: Darrell Walls is a 33 y.o. male with medical history significant for DM, HTN, HLD, ESRD on HD MWF recently had AV fistula placement on 5/14 who presents to the ED with pain redness and swelling at the site of the fistula.  He denies fever or chills. ED course and data review: Vitals within normal limits Labs  notable for mild leukocytosis of 11, hemoglobin 9.5, near baseline, CMP as expected for dialysis status, normal potassium and bicarb slightly elevated anion gap of 16   The ED provider spoke with vascular, Dr. Hampton Walls who recommended admission for IV antibiotics   Hospitalist consulted for admission   Discharge Diagnoses:  Principal Problem:   Dialysis AV fistula infection, initial encounter [facility]) Active Problems:   End stage renal disease on dialysis (HCC)   Anemia due to chronic kidney disease   Diabetes mellitus type 2, insulin  dependent (HCC)   HTN (hypertension)   AV fistula infection, initial encounter (HCC)   Dialysis AV fistula malfunction, sequela Dialysis AV fistula infection & malfunction: continue on IV rocephin , vanco while inpatient. Will continue IV vanco & po cefpodoxime every MWF until 10/20/23. HD to give IV vanco. US  vasc access showed occluded AV fistula so the fistula is non-functional as per vasc surg. No further procedures indicated as per vasc surg. Continue on oxy, pain has improved. Vasc surg following and recs apprec    ESRD: on HD MWF. 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 No need for a transfusion currently    HTN: continue on coreg , irbesartan , clonidine     DM2: well controlled, HbA1c 6.0. Continue on glargine, SSI w/ accuchecks   Depression: severity unknown. Continue on home dose of sertraline     Discharge Instructions  Discharge Instructions     Diet - low sodium heart healthy   Complete by: As directed    Diet Carb Modified   Complete by: As directed    Discharge instructions   Complete by: As directed    F/u w/ PCP in 1-2 weeks. F/u w/ nephro, Dr. Zelda Walls, in 1-2 weeks   Increase activity slowly   Complete by: As directed       Allergies as of 10/14/2023   No Known Allergies      Medication List     STOP taking these medications    traMADol  50 MG tablet Commonly known as: Ultram        TAKE these medications    albuterol 108 (90 Base) MCG/ACT inhaler Commonly known as: VENTOLIN HFA Inhale 2 puffs into the lungs every 6 (six) hours as needed for wheezing or shortness of breath.   amLODipine  10 MG tablet Commonly known as: NORVASC  Take 10 mg by mouth daily.   atorvastatin  20 MG tablet Commonly known as: LIPITOR Take 1 tablet by mouth daily.   Calcium  Acetate 667 MG Tabs Take 1,334 mg by mouth 3 (three) times daily with meals.   carvedilol  25 MG tablet Commonly known as: COREG  Take 25 mg by  mouth 2 (two) times daily.   cefpodoxime 200 MG tablet Commonly known as: VANTIN Take 1 tablet (200 mg total) by mouth every Monday, Wednesday, and Friday for 3 doses. Take medication every MWF after HD is completed Start taking on: Oct 15, 2023   cloNIDine  0.3 MG tablet Commonly known as: CATAPRES  Take 0.3 mg by mouth 3 (three) times daily.   Dexcom G7 Receiver Devi by Does not apply route.   Dexcom G7 Sensor Misc by Does not apply route. What changed: Another medication with the same name was added. Make sure you understand how and when to take each.   Dexcom G7  Sensor Misc Change dexcom sensor every 10 days. 3 sensors. No refills What changed: You were already taking a medication with the same name, and this prescription was added. Make sure you understand how and when to take each.   doxazosin  4 MG tablet Commonly known as: CARDURA  Take 4 mg by mouth 2 (two) times daily.   EPINEPHrine 0.3 MG/0.3ML Sosy Inject 0.3 mg into the muscle once.   furosemide  80 MG tablet Commonly known as: LASIX  Take 80 mg by mouth 2 (two) times daily.   gabapentin  100 MG capsule Commonly known as: NEURONTIN  Take 100 mg by mouth 3 (three) times a week. With dialysis M-W-F   hydrALAZINE  100 MG tablet Commonly known as: APRESOLINE  Take 100 mg by mouth 3 (three) times daily.   insulin  glargine 100 UNIT/ML injection Commonly known as: LANTUS  Inject 12 Units into the skin daily with supper.   insulin  lispro 100 UNIT/ML KwikPen Commonly known as: HUMALOG Inject 2-4 Units into the skin 3 (three) times daily with meals.   Insulin  Pen Needle 29G X Misc Please dispense 1 box of 100 insulin  needles and syringes   irbesartan  300 MG tablet Commonly known as: AVAPRO  Take 300 mg by mouth at bedtime.   mirtazapine  15 MG tablet Commonly known as: REMERON  Take 15 mg by mouth at bedtime.   Oxycodone  HCl 10 MG Tabs Take 1 tablet (10 mg total) by mouth every 6 (six) hours as needed for up to 5 days for moderate pain (pain score 4-6) or severe pain (pain score 7-10).   promethazine  12.5 MG tablet Commonly known as: PHENERGAN  Take 12.5 mg by mouth every 6 (six) hours as needed for nausea or vomiting.   sertraline  50 MG tablet Commonly known as: ZOLOFT  Take 50 mg by mouth daily.   sodium bicarbonate 650 MG tablet Take 650 mg by mouth 2 (two) times daily.   traZODone  50 MG tablet Commonly known as: DESYREL  Take 50 mg by mouth at bedtime.   vancomycin  750 MG/150ML Soln Commonly known as: VANCOREADY Inject 150 mLs (750 mg total) into the vein every Monday,  Wednesday, and Friday with hemodialysis. Start taking on: Oct 15, 2023        No Known Allergies  Consultations: Nephro  Vasc surg    Procedures/Studies: US  Dialysis Access Result Date: 10/12/2023 CLINICAL DATA:  Pain and swelling, previous radiocephalic thrombectomy and stent placement EXAM: ULTRASOUND DIALYSIS ACCESS COMPARISON:  10/05/2023 FINDINGS: The afferent native radial artery is patent with normal low resistance arterial waveform. The radiocephalic fistula is patent. Low velocity low resistance waveform in the cephalic vein just central to the fistula. There is occlusion of the outflow cephalic vein just peripheral to the stent. The stent is occluded through its length. The more central cephalic vein is occluded for 2.6 cm central to the stent. IMPRESSION: Patent radiocephalic fistula.  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 Electronically Signed   By: Nicoletta Barrier M.D.   On: 10/12/2023 14:19   PERIPHERAL VASCULAR CATHETERIZATION Result Date: 10/08/2023 See surgical note for result.  US  Venous Img Upper Uni Left Result Date: 10/05/2023 CLINICAL DATA:  left upper extremity pain and swelling, multiple prior fistula surgeries with stenosis, reportedly recent abscess in extremity, evaluting for clot vs cellulitis vs abscess EXAM: LEFT UPPER EXTREMITY VENOUS DOPPLER ULTRASOUND TECHNIQUE: Gray-scale sonography with graded compression, as well as color Doppler and duplex ultrasound were performed to evaluate the upper extremity deep venous system from the level of the subclavian vein and including the jugular, axillary, basilic, radial, ulnar and upper cephalic vein. Spectral Doppler was utilized to evaluate flow at rest and with distal augmentation maneuvers. COMPARISON:  Report from an ultrasound in an outside institution five days ago, images not available. FINDINGS: Contralateral Subclavian Vein: Respiratory phasicity is normal and symmetric with the  symptomatic side. No evidence of thrombus. Normal compressibility. Internal Jugular Vein: No evidence of thrombus. Normal compressibility, respiratory phasicity and response to augmentation. Subclavian Vein: No evidence of thrombus. Normal compressibility, respiratory phasicity and response to augmentation. Axillary Vein: No evidence of thrombus. Normal compressibility, respiratory phasicity and response to augmentation. Cephalic Vein: The cephalic AV fistula described on prior exam is not evaluated. Flow is demonstrated in the cephalic vein. No fluid collection is seen on the current exam Basilic Vein: No evidence of thrombus. Normal compressibility, respiratory phasicity and response to augmentation. Brachial Veins: No evidence of thrombus. Normal compressibility, respiratory phasicity and response to augmentation. Radial Veins: No evidence of thrombus. Normal compressibility, respiratory phasicity and response to augmentation. Ulnar Veins: Fistula at the wrist which appears thrombosed. Other Findings:  No evidence of focal fluid collection. IMPRESSION: 1. 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 2. The cephalic AV fistula described on prior outside exam with associated collection is not seen on provided images. Vascular surgery follow-up is recommended. 3. No evidence of left upper extremity DVT. Electronically Signed   By: Chadwick Colonel M.D.   On: 10/05/2023 21:39   CT ABDOMEN PELVIS WO CONTRAST Result Date: 10/05/2023 CLINICAL DATA:  Black emesis. EXAM: CT ABDOMEN AND PELVIS WITHOUT CONTRAST TECHNIQUE: Multidetector CT imaging of the abdomen and pelvis was performed following the standard protocol without IV contrast. RADIATION DOSE REDUCTION: This exam was performed according to the departmental dose-optimization program which includes automated exposure control, adjustment of the mA and/or kV according to patient size and/or use of iterative reconstruction technique. COMPARISON:  May 15, 2022 FINDINGS:  Lower chest: A 12 mm thick anterior pericardial effusion is noted. Hepatobiliary: No focal liver abnormality is seen. No gallstones, gallbladder wall thickening, or biliary dilatation. Pancreas: Unremarkable. No pancreatic ductal dilatation or surrounding inflammatory changes. Spleen: Normal in size without focal abnormality. Adrenals/Urinary Tract: Adrenal glands are unremarkable. Kidneys are normal, without renal calculi, focal lesion, or hydronephrosis. Urinary bladder is poorly distended. Mild diffuse urinary bladder wall thickening is seen. Stomach/Bowel: Stomach is within normal limits. Appendix appears normal. Stool is seen throughout the large bowel. No evidence of bowel wall thickening, distention, or inflammatory changes. Vascular/Lymphatic: Aortic atherosclerosis with diffuse calcification of the arterial structures within the abdomen and pelvis. No enlarged abdominal or pelvic lymph nodes. Reproductive: Prostate is unremarkable. Other: No abdominal wall hernia or abnormality. No abdominopelvic ascites. Musculoskeletal: No acute or significant osseous findings. IMPRESSION: 1. Small anterior pericardial effusion. 2. Aortic atherosclerosis. Electronically Signed   By: Virgle Grime  M.D.   On: 10/05/2023 20:34   DG Chest Port 1 View Result Date: 10/05/2023 CLINICAL DATA:  Suspected sepsis.  Vomiting. EXAM: PORTABLE CHEST 1 VIEW COMPARISON:  Radiograph 04/27/2019 FINDINGS: Right-sided dialysis catheter tip in the right atrium.The cardiomediastinal contours are normal. The lungs are clear. Pulmonary vasculature is normal. No consolidation, pleural effusion, or pneumothorax. No acute osseous abnormalities are seen. IMPRESSION: No active disease. Electronically Signed   By: Chadwick Colonel M.D.   On: 10/05/2023 19:43   (Echo, Carotid, EGD, Colonoscopy, ERCP)    Subjective: Pt c/o fatigue    Discharge Exam: Vitals:   10/13/23 2015 10/14/23 0832  BP: (!) 186/89 (!) 190/84  Pulse: 73 67  Resp:  18 18  Temp: 98.7 F (37.1 C) 97.7 F (36.5 C)  SpO2: 99% 98%   Vitals:   10/13/23 1832 10/13/23 1833 10/13/23 2015 10/14/23 0832  BP: (!) 198/86  (!) 186/89 (!) 190/84  Pulse:   73 67  Resp: 16  18 18   Temp: 97.7 F (36.5 C)  98.7 F (37.1 C) 97.7 F (36.5 C)  TempSrc:   Oral   SpO2: 100%  99% 98%  Weight:  77.8 kg    Height:        General: Pt is alert, awake, not in acute distress Cardiovascular: S1/S2 +, no rubs, no gallops Respiratory: CTA bilaterally, no wheezing, no rhonchi Abdominal: Soft, NT, ND, bowel sounds + Extremities: no edema, no cyanosis    The results of significant diagnostics from this hospitalization (including imaging, microbiology, ancillary and laboratory) are listed below for reference.     Microbiology: Recent Results (from the past 240 hours)  Culture, blood (Routine x 2)     Status: None   Collection Time: 10/05/23  5:28 PM   Specimen: BLOOD RIGHT ARM  Result Value Ref Range Status   Specimen Description BLOOD RIGHT ARM  Final   Special Requests   Final    BOTTLES DRAWN AEROBIC AND ANAEROBIC Blood Culture adequate volume   Culture   Final    NO GROWTH 5 DAYS Performed at [facility], [address] [place], [place] [address]    Report Status 10/10/2023 FINAL  Final  Resp panel by RT-PCR (RSV, Flu A&B, Covid) Anterior Nasal Swab     Status: None   Collection Time: 10/05/23  8:38 PM   Specimen: Anterior Nasal Swab  Result Value Ref Range Status   SARS Coronavirus 2 by RT PCR NEGATIVE NEGATIVE Final    Comment: (NOTE) SARS-CoV-2 target nucleic acids are NOT DETECTED.  The SARS-CoV-2 RNA is generally detectable in upper respiratory specimens during the acute phase of infection. The lowest concentration of SARS-CoV-2 viral copies this assay can detect is 138 copies/mL. A negative result does not preclude SARS-Cov-2 infection and should not be used as the sole basis for treatment or other patient management decisions. A  negative result may occur with  improper specimen collection/handling, submission of specimen other than nasopharyngeal swab, presence of viral mutation(s) within the areas targeted by this assay, and inadequate number of viral copies(<138 copies/mL). A negative result must be combined with clinical observations, patient history, and epidemiological information. The expected result is Negative.  Fact Sheet for Patients:  BloggerCourse.com  Fact Sheet for Healthcare Providers:  SeriousBroker.it  This test is no t yet approved or cleared by the [place]  FDA and  has been authorized for detection and/or diagnosis of SARS-CoV-2 by FDA under an Emergency Use Authorization (EUA). This EUA  will remain  in effect (meaning this test can be used) for the duration of the COVID-19 declaration under Section 564(b)(1) of the Act, 21 U.S.C.section 360bbb-3(b)(1), unless the authorization is terminated  or revoked sooner.       Influenza A by PCR NEGATIVE NEGATIVE Final   Influenza B by PCR NEGATIVE NEGATIVE Final    Comment: (NOTE) The Xpert Xpress SARS-CoV-2/FLU/RSV plus assay is intended as an aid in the diagnosis of influenza from Nasopharyngeal swab specimens and should not be used as a sole basis for treatment. Nasal washings and aspirates are unacceptable for Xpert Xpress SARS-CoV-2/FLU/RSV testing.  Fact Sheet for Patients: BloggerCourse.com  Fact Sheet for Healthcare Providers: SeriousBroker.it  This test is not yet approved or cleared by the [place]  FDA and has been authorized for detection and/or diagnosis of SARS-CoV-2 by FDA under an Emergency Use Authorization (EUA). This EUA will remain in effect (meaning this test can be used) for the duration of the COVID-19 declaration under Section 564(b)(1) of the Act, 21 U.S.C. section 360bbb-3(b)(1), unless the authorization  is terminated or revoked.     Resp Syncytial Virus by PCR NEGATIVE NEGATIVE Final    Comment: (NOTE) Fact Sheet for Patients: BloggerCourse.com  Fact Sheet for Healthcare Providers: SeriousBroker.it  This test is not yet approved or cleared by the [place]  FDA and has been authorized for detection and/or diagnosis of SARS-CoV-2 by FDA under an Emergency Use Authorization (EUA). This EUA will remain in effect (meaning this test can be used) for the duration of the COVID-19 declaration under Section 564(b)(1) of the Act, 21 U.S.C. section 360bbb-3(b)(1), unless the authorization is terminated or revoked.  Performed at [facility], [address] [place], [place] [address]   MRSA Next Gen by PCR, Nasal     Status: None   Collection Time: 10/06/23  1:41 AM   Specimen: Nasal Mucosa; Nasal Swab  Result Value Ref Range Status   MRSA by PCR Next Gen NOT DETECTED NOT DETECTED Final    Comment: (NOTE) The GeneXpert MRSA Assay (FDA approved for NASAL specimens only), is one component of a comprehensive MRSA colonization surveillance program. It is not intended to diagnose MRSA infection nor to guide or monitor treatment for MRSA infections. Test performance is not FDA approved in patients less than 41 years old. Performed at [facility], [address] [place], [place] [address]   Culture, blood (Routine X 2) w Reflex to ID Panel     Status: None   Collection Time: 10/06/23  5:03 AM   Specimen: BLOOD  Result Value Ref Range Status   Specimen Description BLOOD BLOOD RIGHT ARM  Final   Special Requests Blood Culture adequate volume  Final   Culture   Final    NO GROWTH 5 DAYS Performed at [facility], [address] [place], [place] [address]    Report Status 10/11/2023 FINAL  Final  Blood culture (routine x 2)     Status: None (Preliminary result)   Collection Time: 10/12/23  1:56 AM    Specimen: BLOOD  Result Value Ref Range Status   Specimen Description BLOOD BLOOD RIGHT ARM  Final   Special Requests   Final    BOTTLES DRAWN AEROBIC AND ANAEROBIC Blood Culture results may not be optimal due to an inadequate volume of blood received in culture bottles   Culture   Final    NO GROWTH 2 DAYS Performed at [facility], [address] [place],  [place] [address]    Report Status PENDING  Incomplete  Blood culture (routine x 2)     Status: None (Preliminary result)   Collection Time: 10/12/23  1:56 AM   Specimen: BLOOD  Result Value Ref Range Status   Specimen Description BLOOD BLOOD LEFT ARM  Final   Special Requests   Final    BOTTLES DRAWN AEROBIC AND ANAEROBIC Blood Culture results may not be optimal due to an inadequate volume of blood received in culture bottles   Culture   Final    NO GROWTH 2 DAYS Performed at [facility], [address], [place], [place] [address]    Report Status PENDING  Incomplete     Labs: BNP (last 3 results) No results for input(s): "BNP" in the last 8760 hours. Basic Metabolic Panel: Recent Labs  Lab 10/08/23 0621 10/12/23 0034 10/13/23 1400 10/14/23 0312  NA 136 137 134* 133*  K 3.7 3.9 5.0 4.1  CL 99 99 98 98  CO2 20* 22 19* 25  GLUCOSE 104* 152* 83 72  BUN 73* 54* 83* 40*  CREATININE 14.76* 9.80* 13.31* 8.15*  CALCIUM  7.7* 7.8* 7.9* 8.0*  PHOS  --   --  10.2*  --    Liver Function Tests: Recent Labs  Lab 10/12/23 0034 10/13/23 1400  AST 27  --   ALT 17  --   ALKPHOS 110  --   BILITOT 0.7  --   PROT 7.6  --   ALBUMIN 3.9 3.1*   No results for input(s): "LIPASE", "AMYLASE" in the last 168 hours. No results for input(s): "AMMONIA" in the last 168 hours. CBC: Recent Labs  Lab 10/08/23 0852 10/12/23 0034 10/13/23 1400 10/14/23 0312  WBC 10.7* 11.0* 11.0* 8.0  NEUTROABS  --  7.2  --   --   HGB 10.8* 9.5* 9.7* 10.3*  HCT 32.3* 29.4* 29.2* 31.4*  MCV 90.0 93.0 91.5 89.2  PLT 266 225 247  238   Cardiac Enzymes: No results for input(s): "CKTOTAL", "CKMB", "CKMBINDEX", "TROPONINI" in the last 168 hours. BNP: Invalid input(s): "POCBNP" CBG: Recent Labs  Lab 10/13/23 2128 10/14/23 0431 10/14/23 0453 10/14/23 0833 10/14/23 1150  GLUCAP 113* 60* 88 105* 103*   D-Dimer No results for input(s): "DDIMER" in the last 72 hours. Hgb A1c No results for input(s): "HGBA1C" in the last 72 hours. Lipid Profile No results for input(s): "CHOL", "HDL", "LDLCALC", "TRIG", "CHOLHDL", "LDLDIRECT" in the last 72 hours. Thyroid function studies No results for input(s): "TSH", "T4TOTAL", "T3FREE", "THYROIDAB" in the last 72 hours.  Invalid input(s): "FREET3" Anemia work up No results for input(s): "VITAMINB12", "FOLATE", "FERRITIN", "TIBC", "IRON", "RETICCTPCT" in the last 72 hours. Urinalysis    Component Value Date/Time   COLORURINE YELLOW (A) 05/16/2022 0410   APPEARANCEUR HAZY (A) 05/16/2022 0410   LABSPEC 1.012 05/16/2022 0410   PHURINE 5.0 05/16/2022 0410   GLUCOSEU >=500 (A) 05/16/2022 0410   HGBUR SMALL (A) 05/16/2022 0410   BILIRUBINUR NEGATIVE 05/16/2022 0410   KETONESUR NEGATIVE 05/16/2022 0410   PROTEINUR >=300 (A) 05/16/2022 0410   NITRITE NEGATIVE 05/16/2022 0410   LEUKOCYTESUR NEGATIVE 05/16/2022 0410   Sepsis Labs Recent Labs  Lab 10/08/23 0852 10/12/23 0034 10/13/23 1400 10/14/23 0312  WBC 10.7* 11.0* 11.0* 8.0   Microbiology Recent Results (from the past 240 hours)  Culture, blood (Routine x 2)     Status: None   Collection Time: 10/05/23  5:28 PM   Specimen: BLOOD RIGHT ARM  Result Value  Ref Range Status   Specimen Description BLOOD RIGHT ARM  Final   Special Requests   Final    BOTTLES DRAWN AEROBIC AND ANAEROBIC Blood Culture adequate volume   Culture   Final    NO GROWTH 5 DAYS Performed at [facility], [address] [place], [place] [address]    Report Status 10/10/2023 FINAL  Final  Resp panel by RT-PCR (RSV, Flu A&B, Covid)  Anterior Nasal Swab     Status: None   Collection Time: 10/05/23  8:38 PM   Specimen: Anterior Nasal Swab  Result Value Ref Range Status   SARS Coronavirus 2 by RT PCR NEGATIVE NEGATIVE Final    Comment: (NOTE) SARS-CoV-2 target nucleic acids are NOT DETECTED.  The SARS-CoV-2 RNA is generally detectable in upper respiratory specimens during the acute phase of infection. The lowest concentration of SARS-CoV-2 viral copies this assay can detect is 138 copies/mL. A negative result does not preclude SARS-Cov-2 infection and should not be used as the sole basis for treatment or other patient management decisions. A negative result may occur with  improper specimen collection/handling, submission of specimen other than nasopharyngeal swab, presence of viral mutation(s) within the areas targeted by this assay, and inadequate number of viral copies(<138 copies/mL). A negative result must be combined with clinical observations, patient history, and epidemiological information. The expected result is Negative.  Fact Sheet for Patients:  BloggerCourse.com  Fact Sheet for Healthcare Providers:  SeriousBroker.it  This test is no t yet approved or cleared by the [place]  FDA and  has been authorized for detection and/or diagnosis of SARS-CoV-2 by FDA under an Emergency Use Authorization (EUA). This EUA will remain  in effect (meaning this test can be used) for the duration of the COVID-19 declaration under Section 564(b)(1) of the Act, 21 U.S.C.section 360bbb-3(b)(1), unless the authorization is terminated  or revoked sooner.       Influenza A by PCR NEGATIVE NEGATIVE Final   Influenza B by PCR NEGATIVE NEGATIVE Final    Comment: (NOTE) The Xpert Xpress SARS-CoV-2/FLU/RSV plus assay is intended as an aid in the diagnosis of influenza from Nasopharyngeal swab specimens and should not be used as a sole basis for treatment. Nasal washings  and aspirates are unacceptable for Xpert Xpress SARS-CoV-2/FLU/RSV testing.  Fact Sheet for Patients: BloggerCourse.com  Fact Sheet for Healthcare Providers: SeriousBroker.it  This test is not yet approved or cleared by the [place]  FDA and has been authorized for detection and/or diagnosis of SARS-CoV-2 by FDA under an Emergency Use Authorization (EUA). This EUA will remain in effect (meaning this test can be used) for the duration of the COVID-19 declaration under Section 564(b)(1) of the Act, 21 U.S.C. section 360bbb-3(b)(1), unless the authorization is terminated or revoked.     Resp Syncytial Virus by PCR NEGATIVE NEGATIVE Final    Comment: (NOTE) Fact Sheet for Patients: BloggerCourse.com  Fact Sheet for Healthcare Providers: SeriousBroker.it  This test is not yet approved or cleared by the [place]  FDA and has been authorized for detection and/or diagnosis of SARS-CoV-2 by FDA under an Emergency Use Authorization (EUA). This EUA will remain in effect (meaning this test can be used) for the duration of the COVID-19 declaration under Section 564(b)(1) of the Act, 21 U.S.C. section 360bbb-3(b)(1), unless the authorization is terminated or revoked.  Performed at [facility], [address] [place], [place] [address]   MRSA Next Gen by PCR, Nasal     Status: None  Collection Time: 10/06/23  1:41 AM   Specimen: Nasal Mucosa; Nasal Swab  Result Value Ref Range Status   MRSA by PCR Next Gen NOT DETECTED NOT DETECTED Final    Comment: (NOTE) The GeneXpert MRSA Assay (FDA approved for NASAL specimens only), is one component of a comprehensive MRSA colonization surveillance program. It is not intended to diagnose MRSA infection nor to guide or monitor treatment for MRSA infections. Test performance is not FDA approved in patients less than 28  years old. Performed at [facility], [address] [place], [place] [address]   Culture, blood (Routine X 2) w Reflex to ID Panel     Status: None   Collection Time: 10/06/23  5:03 AM   Specimen: BLOOD  Result Value Ref Range Status   Specimen Description BLOOD BLOOD RIGHT ARM  Final   Special Requests Blood Culture adequate volume  Final   Culture   Final    NO GROWTH 5 DAYS Performed at [facility], [address] [place], [place] [address]    Report Status 10/11/2023 FINAL  Final  Blood culture (routine x 2)     Status: None (Preliminary result)   Collection Time: 10/12/23  1:56 AM   Specimen: BLOOD  Result Value Ref Range Status   Specimen Description BLOOD BLOOD RIGHT ARM  Final   Special Requests   Final    BOTTLES DRAWN AEROBIC AND ANAEROBIC Blood Culture results may not be optimal due to an inadequate volume of blood received in culture bottles   Culture   Final    NO GROWTH 2 DAYS Performed at [facility], [address], [place], [place] [address]    Report Status PENDING  Incomplete  Blood culture (routine x 2)     Status: None (Preliminary result)   Collection Time: 10/12/23  1:56 AM   Specimen: BLOOD  Result Value Ref Range Status   Specimen Description BLOOD BLOOD LEFT ARM  Final   Special Requests   Final    BOTTLES DRAWN AEROBIC AND ANAEROBIC Blood Culture results may not be optimal due to an inadequate volume of blood received in culture bottles   Culture   Final    NO GROWTH 2 DAYS Performed at [facility], [address], [place], [place] [address]    Report Status PENDING  Incomplete     Time coordinating discharge: Over 30 minutes  SIGNED:   Alphonsus Jeans, MD  [facility] 10/14/2023, 11:59 AM Pager   If 7PM-7AM, please contact night-coverage [email or web address]

## 2023-10-14 NOTE — TOC Transition Note (Signed)
 Transition of Care [facility]) - Discharge Note   Patient Details  Name: Darrell Walls MRN: [id number] Date of Birth: [birth year]  [facility]) CM/SW Contact:  Odilia Bennett, LCSW Phone Number: 10/14/2023, 12:09 PM   Clinical Narrative:  Patient has orders to discharge home today. CSW met with patient and mom at bedside. Mom confirmed he is not currently active with home health but is on the wait list for the CAP program. No further concerns. CSW signing off.  Final next level of care: Home/Self Care Barriers to Discharge: Barriers Resolved   Patient Goals and CMS Choice            Discharge Placement                Patient to be transferred to facility by: Mother Name of family member notified: Merian Stamps Patient and family notified of of transfer: 10/14/23  Discharge Plan and Services Additional resources added to the After Visit Summary for       Post Acute Care Choice: NA                               Social Drivers of Health (SDOH) Interventions SDOH Screenings   Food Insecurity: No Food Insecurity (10/12/2023)  Housing: Low Risk  (10/12/2023)  Transportation Needs: No Transportation Needs (10/12/2023)  Utilities: Not At Risk (10/12/2023)  Financial Resource Strain: Low Risk  (12/19/2022)   Received from [facility]  Recent Concern: Financial Resource Strain - High Risk (10/22/2022)   [facility]  Social Connections: Unknown (02/03/2023)   Received from [facility]  Stress: No Stress Concern Present (03/04/2023)   Received from [facility]  Tobacco Use: Medium Risk (10/12/2023)     Readmission Risk Interventions    10/13/2023   12:00 PM 05/17/2022    3:45 PM  Readmission Risk Prevention Plan  Transportation Screening Complete Complete  PCP or Specialist Appt within 3-5 Days Complete Complete  Social Work Consult for Recovery Care Planning/Counseling Complete   Palliative Care Screening Not  Applicable Not Applicable  Medication Review Oceanographer) Complete Referral to Pharmacy

## 2023-10-14 NOTE — Progress Notes (Signed)
 [facility]  ROUNDING NOTE   Subjective:   Darrell Walls is a 33 y.o. male with past medical history of hypertension, sleep apnea on CPAP, anemia, and end-stage renal disease on hemodialysis.  Patient presents to the emergency department complaining of redness and swelling at fistula site and has been admitted for AV fistula infection, initial encounter (HCC) Clemente.[id number].[id number]] Postoperative infection, unspecified type, initial encounter [T81.40XA]  Patient is known our practice and receives outpatient dialysis treatments at [facility] on a MWF schedule, supervised by Dr. Zelda Hickman.  Patient was recently admitted and treated for DKA, discharged on 5/14.   Patient seen resting in bed Alert and oriented Reports pain in right arm, but has improved since admission.    Objective:  Vital signs in last 24 hours:  Temp:  [97.7 F (36.5 C)-98.7 F (37.1 C)] 97.7 F (36.5 C) (05/20 0832) Pulse Rate:  [67-78] 67 (05/20 0832) Resp:  [8-20] 18 (05/20 0832) BP: (173-198)/(84-103) 190/84 (05/20 0832) SpO2:  [98 %-100 %] 98 % (05/20 0832) Weight:  [77.8 kg-79.8 kg] 77.8 kg (05/19 1833)  Weight change:  Filed Weights   10/12/23 0320 10/13/23 1344 10/13/23 1833  Weight: 80.8 kg 79.8 kg 77.8 kg    Intake/Output: I/O last 3 completed shifts: In: 930 [P.O.:480; IV Piggyback:450] Out: 2200 [Other:2200]   Intake/Output this shift:  No intake/output data recorded.  Physical Exam: General: NAD  Head: Normocephalic, atraumatic. Moist oral mucosal membranes  Eyes: Anicteric  Lungs:  Clear to auscultation,normal effort  Heart: Regular rate and rhythm  Abdomen:  Soft, nontender  Extremities:  No peripheral edema.  Neurologic: Alert  Skin: No lesions  Access: Rt permcath, aVF ( bruit or thrill)    Basic Metabolic Panel: Recent Labs  Lab 10/08/23 0621 10/12/23 0034 10/13/23 1400 10/14/23 0312  NA 136 137 134* 133*  K 3.7 3.9 5.0 4.1  CL 99 99 98 98  CO2 20* 22 19* 25   GLUCOSE 104* 152* 83 72  BUN 73* 54* 83* 40*  CREATININE 14.76* 9.80* 13.31* 8.15*  CALCIUM  7.7* 7.8* 7.9* 8.0*  PHOS  --   --  10.2*  --     Liver Function Tests: Recent Labs  Lab 10/12/23 0034 10/13/23 1400  AST 27  --   ALT 17  --   ALKPHOS 110  --   BILITOT 0.7  --   PROT 7.6  --   ALBUMIN 3.9 3.1*   No results for input(s): "LIPASE", "AMYLASE" in the last 168 hours.  No results for input(s): "AMMONIA" in the last 168 hours.  CBC: Recent Labs  Lab 10/08/23 0852 10/12/23 0034 10/13/23 1400 10/14/23 0312  WBC 10.7* 11.0* 11.0* 8.0  NEUTROABS  --  7.2  --   --   HGB 10.8* 9.5* 9.7* 10.3*  HCT 32.3* 29.4* 29.2* 31.4*  MCV 90.0 93.0 91.5 89.2  PLT 266 225 247 238    Cardiac Enzymes: No results for input(s): "CKTOTAL", "CKMB", "CKMBINDEX", "TROPONINI" in the last 168 hours.  BNP: Invalid input(s): "POCBNP"  CBG: Recent Labs  Lab 10/13/23 1859 10/13/23 2121 10/13/23 2128 10/14/23 0431 10/14/23 0453  GLUCAP 76 112* 113* 60* 88    Microbiology: Results for orders placed or performed during the hospital encounter of 10/12/23  Blood culture (routine x 2)     Status: None (Preliminary result)   Collection Time: 10/12/23  1:56 AM   Specimen: BLOOD  Result Value Ref Range Status   Specimen Description BLOOD BLOOD RIGHT  ARM  Final   Special Requests   Final    BOTTLES DRAWN AEROBIC AND ANAEROBIC Blood Culture results may not be optimal due to an inadequate volume of blood received in culture bottles   Culture   Final    NO GROWTH 2 DAYS Performed at [facility], [address] [place], [place] [address]    Report Status PENDING  Incomplete  Blood culture (routine x 2)     Status: None (Preliminary result)   Collection Time: 10/12/23  1:56 AM   Specimen: BLOOD  Result Value Ref Range Status   Specimen Description BLOOD BLOOD LEFT ARM  Final   Special Requests   Final    BOTTLES DRAWN AEROBIC AND ANAEROBIC Blood Culture results may not be  optimal due to an inadequate volume of blood received in culture bottles   Culture   Final    NO GROWTH 2 DAYS Performed at [facility], [address] [place], [place] [address]    Report Status PENDING  Incomplete    Coagulation Studies: No results for input(s): "LABPROT", "INR" in the last 72 hours.   Urinalysis: No results for input(s): "COLORURINE", "LABSPEC", "PHURINE", "GLUCOSEU", "HGBUR", "BILIRUBINUR", "KETONESUR", "PROTEINUR", "UROBILINOGEN", "NITRITE", "LEUKOCYTESUR" in the last 72 hours.  Invalid input(s): "APPERANCEUR"    Imaging: US  Dialysis Access Result Date: 10/12/2023 CLINICAL DATA:  Pain and swelling, previous radiocephalic thrombectomy and stent placement EXAM: ULTRASOUND DIALYSIS ACCESS COMPARISON:  10/05/2023 FINDINGS: The afferent native radial artery is patent with normal low resistance arterial waveform. The radiocephalic fistula is patent. Low velocity low resistance waveform in the cephalic vein just central to the fistula. There is occlusion of the outflow cephalic vein just peripheral to the stent. The stent is occluded through its length. The more central cephalic vein is occluded for 2.6 cm central to the stent. IMPRESSION: Patent radiocephalic fistula. 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 Electronically Signed   By: Nicoletta Barrier M.D.   On: 10/12/2023 14:19      Medications:    cefTRIAXone  (ROCEPHIN )  IV 1 g (10/14/23 0456)   vancomycin  Stopped (10/13/23 1836)    atorvastatin   20 mg Oral Daily   carvedilol   25 mg Oral BID WC   Chlorhexidine  Gluconate Cloth  6 each Topical Q0600   cloNIDine   0.3 mg Oral TID   furosemide   80 mg Oral BID   heparin   5,000 Units Subcutaneous Q8H   insulin  aspart  0-5 Units Subcutaneous QHS   insulin  aspart  0-6 Units Subcutaneous TID WC   irbesartan   300 mg Oral Daily   sertraline   50 mg Oral Daily   acetaminophen  **OR** acetaminophen , morphine  injection, ondansetron   **OR** ondansetron  (ZOFRAN ) IV, oxyCODONE   Assessment/ Plan:  Mr. Darrell Walls is a 33 y.o.  male with past medical history of hypertension, sleep apnea on CPAP, anemia, and end-stage renal disease on hemodialysis.  Patient presents to the emergency department complaining of abdominal pain and vomiting and has been admitted for AV fistula infection, initial encounter (HCC) Clemente.Coach.[id number]] Postoperative infection, unspecified type, initial encounter [T81.40XA]  [facility]/MWF/Rt permcath  End-stage renal disease on hemodialysis.  Dialysis received yesterday, UF 2L achieved. Next treatment scheduled for Wednesday.   2. Diabetes mellitus type II with chronic kidney disease/renal manifestations: DKA during previous admission.  Insulin  dependent. Home regimen includes Lantus  and lispro. Most recent hemoglobin A1c is 8.8 in 2023. No outpatient SLGT-2 inhibitors.  Hypoglycemia noted this morning. SSI managed  by primary team.   3.  Hypertensive urgency.  Home regimen includes furosemide , amlodipine , carvedilol , clonidine , doxazosin , hydralazine , and irbesartan . Blood pressure elevated.  Receiving carvedilol , clonidine , furosemide , and irbesartan .  4. Anemia of chronic kidney disease/ kidney injury/chronic disease/acute blood loss:  Lab Results  Component Value Date   HGB 10.3 (L) 10/14/2023    Hemoglobin at goal.  5.  Infection, AV fistula.  Elevated white count, edema.  Primary team has ordered Rocephin  and vancomycin  with dialysis. Will need continued antibiotics at discharge. Notified outpatient clinic of need for Vancomycin  750mg  with each treatment until 10/20/23.    LOS: 2 Bevin Mayall 5/20/202510:45 AM

## 2023-10-15 LAB — HEPATITIS B SURFACE ANTIBODY, QUANTITATIVE: Hep B S AB Quant (Post): <3.5 m[IU]/mL — ABNORMAL LOW

## 2023-10-17 LAB — CULTURE, BLOOD (ROUTINE X 2)
Culture: NO GROWTH
Culture: NO GROWTH

## 2023-11-19 ENCOUNTER — Inpatient Hospital Stay
Admission: EM | Admit: 2023-11-19 | Discharge: 2023-11-20 | DRG: 637 | Discharge status: Against Medical Advice | Attending: Internal Medicine | Admitting: Internal Medicine

## 2023-11-19 ENCOUNTER — Other Ambulatory Visit: Payer: Self-pay

## 2023-11-19 ENCOUNTER — Emergency Department

## 2023-11-19 ENCOUNTER — Encounter: Payer: Self-pay | Admitting: Internal Medicine

## 2023-11-19 ENCOUNTER — Inpatient Hospital Stay

## 2023-11-19 DIAGNOSIS — T446X6A Underdosing of alpha-adrenoreceptor antagonists, initial encounter: Secondary | ICD-10-CM | POA: Diagnosis present

## 2023-11-19 DIAGNOSIS — D631 Anemia in chronic kidney disease: Secondary | ICD-10-CM | POA: Diagnosis present

## 2023-11-19 DIAGNOSIS — Z5329 Procedure and treatment not carried out because of patient's decision for other reasons: Secondary | ICD-10-CM | POA: Diagnosis present

## 2023-11-19 DIAGNOSIS — E785 Hyperlipidemia, unspecified: Secondary | ICD-10-CM | POA: Diagnosis present

## 2023-11-19 DIAGNOSIS — T465X6A Underdosing of other antihypertensive drugs, initial encounter: Secondary | ICD-10-CM | POA: Diagnosis present

## 2023-11-19 DIAGNOSIS — T501X6A Underdosing of loop [high-ceiling] diuretics, initial encounter: Secondary | ICD-10-CM | POA: Diagnosis present

## 2023-11-19 DIAGNOSIS — E1059 Type 1 diabetes mellitus with other circulatory complications: Secondary | ICD-10-CM | POA: Diagnosis present

## 2023-11-19 DIAGNOSIS — Z87891 Personal history of nicotine dependence: Secondary | ICD-10-CM | POA: Diagnosis not present

## 2023-11-19 DIAGNOSIS — Z992 Dependence on renal dialysis: Secondary | ICD-10-CM

## 2023-11-19 DIAGNOSIS — R7989 Other specified abnormal findings of blood chemistry: Secondary | ICD-10-CM | POA: Insufficient documentation

## 2023-11-19 DIAGNOSIS — T461X6A Underdosing of calcium-channel blockers, initial encounter: Secondary | ICD-10-CM | POA: Diagnosis present

## 2023-11-19 DIAGNOSIS — I160 Hypertensive urgency: Secondary | ICD-10-CM | POA: Diagnosis present

## 2023-11-19 DIAGNOSIS — T383X6A Underdosing of insulin and oral hypoglycemic [antidiabetic] drugs, initial encounter: Secondary | ICD-10-CM | POA: Diagnosis present

## 2023-11-19 DIAGNOSIS — I10 Essential (primary) hypertension: Secondary | ICD-10-CM | POA: Diagnosis present

## 2023-11-19 DIAGNOSIS — I152 Hypertension secondary to endocrine disorders: Secondary | ICD-10-CM | POA: Diagnosis present

## 2023-11-19 DIAGNOSIS — Z91148 Patient's other noncompliance with medication regimen for other reason: Secondary | ICD-10-CM

## 2023-11-19 DIAGNOSIS — E1310 Other specified diabetes mellitus with ketoacidosis without coma: Secondary | ICD-10-CM | POA: Diagnosis not present

## 2023-11-19 DIAGNOSIS — T7840XA Allergy, unspecified, initial encounter: Secondary | ICD-10-CM | POA: Diagnosis present

## 2023-11-19 DIAGNOSIS — E1010 Type 1 diabetes mellitus with ketoacidosis without coma: Secondary | ICD-10-CM | POA: Diagnosis present

## 2023-11-19 DIAGNOSIS — E1022 Type 1 diabetes mellitus with diabetic chronic kidney disease: Secondary | ICD-10-CM | POA: Diagnosis present

## 2023-11-19 DIAGNOSIS — I2489 Other forms of acute ischemic heart disease: Secondary | ICD-10-CM | POA: Diagnosis present

## 2023-11-19 DIAGNOSIS — T447X6A Underdosing of beta-adrenoreceptor antagonists, initial encounter: Secondary | ICD-10-CM | POA: Diagnosis present

## 2023-11-19 DIAGNOSIS — G4733 Obstructive sleep apnea (adult) (pediatric): Secondary | ICD-10-CM | POA: Diagnosis present

## 2023-11-19 DIAGNOSIS — N186 End stage renal disease: Secondary | ICD-10-CM | POA: Diagnosis present

## 2023-11-19 DIAGNOSIS — R112 Nausea with vomiting, unspecified: Secondary | ICD-10-CM

## 2023-11-19 DIAGNOSIS — F17200 Nicotine dependence, unspecified, uncomplicated: Secondary | ICD-10-CM | POA: Diagnosis present

## 2023-11-19 DIAGNOSIS — Z91128 Patient's intentional underdosing of medication regimen for other reason: Secondary | ICD-10-CM | POA: Diagnosis not present

## 2023-11-19 DIAGNOSIS — E1110 Type 2 diabetes mellitus with ketoacidosis without coma: Secondary | ICD-10-CM | POA: Diagnosis present

## 2023-11-19 LAB — COMPREHENSIVE METABOLIC PANEL WITH GFR
ALT: 20 U/L (ref 0–44)
AST: 25 U/L (ref 15–41)
Albumin: 4.1 g/dL (ref 3.5–5.0)
Alkaline Phosphatase: 100 U/L (ref 38–126)
Anion gap: 23 — ABNORMAL HIGH (ref 5–15)
BUN: 73 mg/dL — ABNORMAL HIGH (ref 6–20)
CO2: 20 mmol/L — ABNORMAL LOW (ref 22–32)
Calcium: 10.1 mg/dL (ref 8.9–10.3)
Chloride: 97 mmol/L — ABNORMAL LOW (ref 98–111)
Creatinine, Ser: 14.53 mg/dL — ABNORMAL HIGH (ref 0.61–1.24)
GFR, Estimated: 4 mL/min — ABNORMAL LOW (ref >60)
Glucose, Bld: 245 mg/dL — ABNORMAL HIGH (ref 70–99)
Potassium: 4.8 mmol/L (ref 3.5–5.1)
Sodium: 140 mmol/L (ref 135–145)
Total Bilirubin: 1 mg/dL (ref 0.0–1.2)
Total Protein: 7.8 g/dL (ref 6.5–8.1)

## 2023-11-19 LAB — PHOSPHORUS: Phosphorus: 6.1 mg/dL — ABNORMAL HIGH (ref 2.5–4.6)

## 2023-11-19 LAB — LIPASE, BLOOD: Lipase: 22 U/L (ref 11–51)

## 2023-11-19 LAB — BASIC METABOLIC PANEL WITH GFR
Anion gap: 19 — ABNORMAL HIGH (ref 5–15)
Anion gap: 19 — ABNORMAL HIGH (ref 5–15)
BUN: 75 mg/dL — ABNORMAL HIGH (ref 6–20)
BUN: 76 mg/dL — ABNORMAL HIGH (ref 6–20)
CO2: 21 mmol/L — ABNORMAL LOW (ref 22–32)
CO2: 23 mmol/L (ref 22–32)
Calcium: 9.1 mg/dL (ref 8.9–10.3)
Calcium: 9.3 mg/dL (ref 8.9–10.3)
Chloride: 101 mmol/L (ref 98–111)
Chloride: 102 mmol/L (ref 98–111)
Creatinine, Ser: 14.93 mg/dL — ABNORMAL HIGH (ref 0.61–1.24)
Creatinine, Ser: 15.21 mg/dL — ABNORMAL HIGH (ref 0.61–1.24)
GFR, Estimated: 4 mL/min — ABNORMAL LOW (ref >60)
GFR, Estimated: 4 mL/min — ABNORMAL LOW (ref >60)
Glucose, Bld: 163 mg/dL — ABNORMAL HIGH (ref 70–99)
Glucose, Bld: 132 mg/dL — ABNORMAL HIGH (ref 70–99)
Potassium: 5 mmol/L (ref 3.5–5.1)
Potassium: 4.9 mmol/L (ref 3.5–5.1)
Sodium: 141 mmol/L (ref 135–145)
Sodium: 144 mmol/L (ref 135–145)

## 2023-11-19 LAB — CBC WITH DIFFERENTIAL/PLATELET
Abs Immature Granulocytes: 0.07 10*3/uL (ref 0.00–0.07)
Basophils Absolute: 0 10*3/uL (ref 0.0–0.1)
Basophils Relative: 0 %
Eosinophils Absolute: 0.1 10*3/uL (ref 0.0–0.5)
Eosinophils Relative: 1 %
HCT: 42.2 % (ref 39.0–52.0)
Hemoglobin: 13.8 g/dL (ref 13.0–17.0)
Immature Granulocytes: 1 %
Lymphocytes Relative: 7 %
Lymphs Abs: 1.1 10*3/uL (ref 0.7–4.0)
MCH: 28.8 pg (ref 26.0–34.0)
MCHC: 32.7 g/dL (ref 30.0–36.0)
MCV: 88.1 fL (ref 80.0–100.0)
Monocytes Absolute: 0.5 10*3/uL (ref 0.1–1.0)
Monocytes Relative: 3 %
Neutro Abs: 13.6 10*3/uL — ABNORMAL HIGH (ref 1.7–7.7)
Neutrophils Relative %: 88 %
Platelets: 310 10*3/uL (ref 150–400)
RBC: 4.79 MIL/uL (ref 4.22–5.81)
RDW: 16.9 % — ABNORMAL HIGH (ref 11.5–15.5)
WBC: 15.4 10*3/uL — ABNORMAL HIGH (ref 4.0–10.5)
nRBC: 0 % (ref 0.0–0.2)

## 2023-11-19 LAB — MAGNESIUM: Magnesium: 2.6 mg/dL — ABNORMAL HIGH (ref 1.7–2.4)

## 2023-11-19 LAB — GLUCOSE, CAPILLARY
Glucose-Capillary: 265 mg/dL — ABNORMAL HIGH (ref 70–99)
Glucose-Capillary: 160 mg/dL — ABNORMAL HIGH (ref 70–99)
Glucose-Capillary: 152 mg/dL — ABNORMAL HIGH (ref 70–99)
Glucose-Capillary: 176 mg/dL — ABNORMAL HIGH (ref 70–99)
Glucose-Capillary: 170 mg/dL — ABNORMAL HIGH (ref 70–99)
Glucose-Capillary: 153 mg/dL — ABNORMAL HIGH (ref 70–99)
Glucose-Capillary: 127 mg/dL — ABNORMAL HIGH (ref 70–99)

## 2023-11-19 LAB — BLOOD GAS, VENOUS
Acid-Base Excess: 2.3 mmol/L — ABNORMAL HIGH (ref 0.0–2.0)
Bicarbonate: 23.9 mmol/L (ref 20.0–28.0)
O2 Saturation: 95.3 %
Patient temperature: 37
pCO2, Ven: 28 mmHg — ABNORMAL LOW (ref 44–60)
pH, Ven: 7.54 — ABNORMAL HIGH (ref 7.25–7.43)
pO2, Ven: 68 mmHg — ABNORMAL HIGH (ref 32–45)

## 2023-11-19 LAB — TROPONIN I (HIGH SENSITIVITY)
Troponin I (High Sensitivity): 37 ng/L — ABNORMAL HIGH (ref <18)
Troponin I (High Sensitivity): 43 ng/L — ABNORMAL HIGH (ref <18)

## 2023-11-19 LAB — CBG MONITORING, ED: Glucose-Capillary: 261 mg/dL — ABNORMAL HIGH (ref 70–99)

## 2023-11-19 LAB — MRSA NEXT GEN BY PCR, NASAL: MRSA by PCR Next Gen: NOT DETECTED

## 2023-11-19 LAB — BETA-HYDROXYBUTYRIC ACID: Beta-Hydroxybutyric Acid: 1.1 mmol/L — ABNORMAL HIGH (ref 0.05–0.27)

## 2023-11-19 MED ORDER — LACTATED RINGERS IV SOLN
INTRAVENOUS | Status: DC
Start: 2023-11-19 — End: 2023-11-20

## 2023-11-19 MED ORDER — GABAPENTIN 100 MG PO CAPS: 100.0000 mg | ORAL_CAPSULE | ORAL | Status: DC

## 2023-11-19 MED ORDER — SERTRALINE HCL 50 MG PO TABS: 50.0000 mg | ORAL_TABLET | Freq: Every day | ORAL | Status: DC

## 2023-11-19 MED ORDER — HEPARIN SODIUM (PORCINE) 5000 UNIT/ML IJ SOLN
5000.0000 [IU] | Freq: Three times a day (TID) | INTRAMUSCULAR | Status: DC
  Administered 2023-11-19: 5000 [IU] via SUBCUTANEOUS
  Filled 2023-11-19: qty 1

## 2023-11-19 MED ORDER — CARVEDILOL 12.5 MG PO TABS: 25.0000 mg | ORAL_TABLET | Freq: Two times a day (BID) | ORAL | Status: DC

## 2023-11-19 MED ORDER — LORAZEPAM 1 MG PO TABS
0.5000 mg | ORAL_TABLET | Freq: Four times a day (QID) | ORAL | Status: DC | PRN
  Filled 2023-11-19: qty 1

## 2023-11-19 MED ORDER — SODIUM CHLORIDE 0.9 % IV SOLN
12.5000 mg | Freq: Four times a day (QID) | INTRAVENOUS | Status: DC | PRN
  Administered 2023-11-19 – 2023-11-20 (×2): 12.5 mg via INTRAVENOUS
  Filled 2023-11-19 (×2): qty 12.5

## 2023-11-19 MED ORDER — ACETAMINOPHEN 325 MG PO TABS: 650.0000 mg | ORAL_TABLET | Freq: Four times a day (QID) | ORAL | Status: DC | PRN

## 2023-11-19 MED ORDER — HYDRALAZINE HCL 50 MG PO TABS
100.0000 mg | ORAL_TABLET | Freq: Three times a day (TID) | ORAL | Status: DC
  Filled 2023-11-19: qty 2

## 2023-11-19 MED ORDER — LACTATED RINGERS IV SOLN: INTRAVENOUS | Status: DC

## 2023-11-19 MED ORDER — LACTATED RINGERS IV BOLUS
20.0000 mL/kg | Freq: Once | INTRAVENOUS | Status: AC
  Administered 2023-11-19: 1496 mL via INTRAVENOUS

## 2023-11-19 MED ORDER — CLONIDINE HCL 0.1 MG PO TABS
0.3000 mg | ORAL_TABLET | Freq: Three times a day (TID) | ORAL | Status: DC
  Filled 2023-11-19: qty 3

## 2023-11-19 MED ORDER — LABETALOL HCL 5 MG/ML IV SOLN
20.0000 mg | Freq: Once | INTRAVENOUS | Status: AC
  Administered 2023-11-19: 20 mg via INTRAVENOUS

## 2023-11-19 MED ORDER — LABETALOL HCL 5 MG/ML IV SOLN
10.0000 mg | Freq: Once | INTRAVENOUS | Status: AC
  Administered 2023-11-19: 10 mg via INTRAVENOUS
  Filled 2023-11-19: qty 4

## 2023-11-19 MED ORDER — DEXTROSE 50 % IV SOLN: 0.0000 mL | INTRAVENOUS | Status: DC | PRN

## 2023-11-19 MED ORDER — POTASSIUM CHLORIDE 10 MEQ/100ML IV SOLN
10.0000 meq | INTRAVENOUS | Status: AC
  Administered 2023-11-19 (×2): 10 meq via INTRAVENOUS
  Filled 2023-11-19 (×2): qty 100

## 2023-11-19 MED ORDER — FUROSEMIDE 20 MG PO TABS
80.0000 mg | ORAL_TABLET | Freq: Two times a day (BID) | ORAL | Status: DC
  Filled 2023-11-19: qty 4

## 2023-11-19 MED ORDER — ATORVASTATIN CALCIUM 20 MG PO TABS: 20.0000 mg | ORAL_TABLET | Freq: Every day | ORAL | Status: DC

## 2023-11-19 MED ORDER — IOHEXOL 300 MG/ML  SOLN
100.0000 mL | Freq: Once | INTRAMUSCULAR | Status: DC | PRN
Start: 2023-11-19 — End: 2023-11-20

## 2023-11-19 MED ORDER — CALCIUM ACETATE (PHOS BINDER) 667 MG PO CAPS
1334.0000 mg | ORAL_CAPSULE | Freq: Three times a day (TID) | ORAL | Status: DC
  Filled 2023-11-19 (×2): qty 2

## 2023-11-19 MED ORDER — CHLORHEXIDINE GLUCONATE CLOTH 2 % EX PADS
6.0000 | MEDICATED_PAD | Freq: Every day | CUTANEOUS | Status: DC
  Administered 2023-11-19: 6 via TOPICAL

## 2023-11-19 MED ORDER — INSULIN REGULAR(HUMAN) IN NACL 100-0.9 UT/100ML-% IV SOLN
INTRAVENOUS | Status: DC
  Administered 2023-11-19: 0.7 [IU]/h via INTRAVENOUS

## 2023-11-19 MED ORDER — TRAZODONE HCL 50 MG PO TABS
50.0000 mg | ORAL_TABLET | Freq: Every day | ORAL | Status: DC
  Administered 2023-11-19: 50 mg via ORAL
  Filled 2023-11-19: qty 1

## 2023-11-19 MED ORDER — AMLODIPINE BESYLATE 10 MG PO TABS: 10.0000 mg | ORAL_TABLET | Freq: Every day | ORAL | Status: DC

## 2023-11-19 MED ORDER — FAMOTIDINE IN NACL 20-0.9 MG/50ML-% IV SOLN
20.0000 mg | Freq: Once | INTRAVENOUS | Status: AC
  Administered 2023-11-19: 20 mg via INTRAVENOUS
  Filled 2023-11-19: qty 50

## 2023-11-19 MED ORDER — DEXTROSE IN LACTATED RINGERS 5 % IV SOLN: INTRAVENOUS | Status: DC

## 2023-11-19 MED ORDER — HYDRALAZINE HCL 20 MG/ML IJ SOLN
10.0000 mg | INTRAMUSCULAR | Status: DC | PRN
  Administered 2023-11-19 – 2023-11-20 (×3): 10 mg via INTRAVENOUS
  Filled 2023-11-19 (×3): qty 1

## 2023-11-19 MED ORDER — IRBESARTAN 150 MG PO TABS
300.0000 mg | ORAL_TABLET | Freq: Every day | ORAL | Status: DC
  Filled 2023-11-19: qty 2

## 2023-11-19 MED ORDER — MIRTAZAPINE 15 MG PO TABS
15.0000 mg | ORAL_TABLET | Freq: Every day | ORAL | Status: DC
  Filled 2023-11-19: qty 1

## 2023-11-19 MED ORDER — LABETALOL HCL 5 MG/ML IV SOLN
20.0000 mg | Freq: Once | INTRAVENOUS | Status: AC
  Administered 2023-11-19: 20 mg via INTRAVENOUS
  Filled 2023-11-19: qty 4

## 2023-11-19 MED ORDER — INSULIN REGULAR(HUMAN) IN NACL 100-0.9 UT/100ML-% IV SOLN
INTRAVENOUS | Status: DC
  Administered 2023-11-19: 5 [IU]/h via INTRAVENOUS
  Filled 2023-11-19: qty 100

## 2023-11-19 MED ORDER — DOXAZOSIN MESYLATE 4 MG PO TABS
4.0000 mg | ORAL_TABLET | Freq: Two times a day (BID) | ORAL | Status: DC
  Filled 2023-11-19 (×2): qty 1

## 2023-11-19 MED ORDER — ACETAMINOPHEN 10 MG/ML IV SOLN: 1000.0000 mg | Freq: Four times a day (QID) | INTRAVENOUS | Status: DC | PRN

## 2023-11-19 MED ORDER — VANCOMYCIN HCL 750 MG/150ML IV SOLN: 750.0000 mg | INTRAVENOUS | Status: DC

## 2023-11-19 MED ORDER — HYDRALAZINE HCL 20 MG/ML IJ SOLN: 5.0000 mg | INTRAMUSCULAR | Status: DC | PRN

## 2023-11-19 MED ORDER — ONDANSETRON HCL 4 MG/2ML IJ SOLN
4.0000 mg | Freq: Once | INTRAMUSCULAR | Status: AC
  Administered 2023-11-19: 4 mg via INTRAVENOUS
  Filled 2023-11-19: qty 2

## 2023-11-19 MED ORDER — LABETALOL HCL 5 MG/ML IV SOLN
5.0000 mg | INTRAVENOUS | Status: DC | PRN
  Administered 2023-11-19: 5 mg via INTRAVENOUS
  Filled 2023-11-19 (×2): qty 4

## 2023-11-19 MED ORDER — NICOTINE 21 MG/24HR TD PT24: 21.0000 mg | MEDICATED_PATCH | Freq: Every day | TRANSDERMAL | Status: DC | PRN

## 2023-11-19 NOTE — Assessment & Plan Note (Signed)
 Home hydralazine  100 mg tablet, 3 times daily resumed

## 2023-11-19 NOTE — Hospital Course (Signed)
 Mr. Darrell Walls is a 33 year old male with history of insulin -dependent diabetes mellitus, hypertension, hyperlipidemia, ESRD on HD MWF, with AV fistula placement on 10/08/2023, anemia of chronic kidney disease, who presents to the emergency department for chief concerns of nausea and vomiting since 11/14/2023.  Vitals in the ED showed T of 98.7, respiration rate 10, heart rate 99, blood pressure 184/100, SpO2 95% on room air.  Serum sodium is 140, potassium 4.8, chloride 97, bicarb 20, BUN of 73, serum creatinine of 14.53, EGFR 4, nonfasting blood glucose 245, WBC 15.4, hemoglobin 13.8, platelets of 310.  ED treatment: Labetalol  20 mg IV one-time dose, famotidine  20 mg IV, insulin  Endo tool, LR 1496 bolus, potassium chloride  10 mEq IV, 2 doses ordered.

## 2023-11-19 NOTE — Assessment & Plan Note (Signed)
-   Nephrology has been consulted

## 2023-11-19 NOTE — H&P (Addendum)
 History and Physical   Darrell Walls FMW:[id number] DOB: 06/06/1990 DOA: 11/19/2023  PCP: Mallie Glisson, MD  Outpatient Specialists: Dr. Aundria, [facility] Patient coming from: Home via EMS  I have personally briefly reviewed patient's old medical records in [facility] EMR.  Chief Concern: Nausea, vomiting  HPI: Darrell Walls is a 33 year old male with history of insulin -dependent diabetes mellitus, hypertension, hyperlipidemia, ESRD on HD MWF, with AV fistula placement on 10/08/2023, anemia of chronic kidney disease, who presents to the emergency department for chief concerns of nausea and vomiting since 11/14/2023.  Vitals in the ED showed T of 98.7, respiration rate 10, heart rate 99, blood pressure 184/100, SpO2 95% on room air.  Serum sodium is 140, potassium 4.8, chloride 97, bicarb 20, BUN of 73, serum creatinine of 14.53, EGFR 4, nonfasting blood glucose 245, WBC 15.4, hemoglobin 13.8, platelets of 310.  ED treatment: Labetalol  20 mg IV one-time dose, famotidine  20 mg IV, insulin  Endo tool, LR 1496 bolus, potassium chloride  10 mEq IV, 2 doses ordered. ------------------------------------- At bedside, patient able to tell me his name, age, current location of hospital.  He states he has been feeling unwell for the past week so he has not been taking his insulin , or other medications.  Social history: He lives at home with his mom.  He denies tobacco, EtOH, recreational drug use.  ROS: Constitutional: no weight change, no fever ENT/Mouth: no sore throat, no rhinorrhea Eyes: no eye pain, no vision changes Cardiovascular: no chest pain, no dyspnea,  no edema, no palpitations Respiratory: no cough, no sputum, no wheezing Gastrointestinal: + nausea, + vomiting, no diarrhea, no constipation Genitourinary: no urinary incontinence, no dysuria, no hematuria Musculoskeletal: no arthralgias, no myalgias Skin: no skin lesions, no pruritus, Neuro: + weakness, no loss of  consciousness, no syncope Psych: no anxiety, no depression, + decrease appetite Heme/Lymph: no bruising, no bleeding  ED Course: Discussed with EDP, patient requiring hospitalization for chief concerns of DKA.  Assessment/Plan  Principal Problem:   DKA (diabetic ketoacidosis) (HCC) Active Problems:   Hypertensive urgency   ESRD on hemodialysis (HCC)   HTN (hypertension)   OSA on CPAP   Nicotine dependence, unspecified, uncomplicated   Elevated troponin   Assessment and Plan:  * DKA (diabetic ketoacidosis) (HCC) Check portable chest x-ray, UDS, high sensitivity troponin Plan per Endo tool N.p.o. except for noncaloric beverages BMP every 4 hours Admit to stepdown, inpatient  Hypertensive urgency One-time dose of labetalol  20 mg once ordered on admission Secondary to patient not taking his medications for several days Home Coreg  25 mg p.o. twice daily, clonidine  0.3 mg 3 times daily, doxazosin  4 mg twice daily, furosemide  80 mg twice daily, hydralazine  100 mg 3 times daily, irbesartan  300 mg nightly, amlodipine  10 mg daily were resumed on admission Hydralazine  10 mg every 4 hours as needed for SBP greater 180, 1 day ordered  HTN (hypertension) Home hydralazine  100 mg tablet, 3 times daily resumed  ESRD on hemodialysis [facility]) Nephrology has been consulted  Nicotine dependence, unspecified, uncomplicated As needed nicotine patch ordered  OSA on CPAP CPAP nightly ordered  Elevated troponin Initial value was 37 I suspect this is secondary to demand ischemia in setting of hypertensive urgency Will continue to follow second HS troponin, if there is a positive delta we will initiate heparin  GTT  Chart reviewed.   DVT prophylaxis: Heparin  5000 units subcutaneous every 2 hours Code Status: full code  Diet: N.p.o. except for noncaloric beverages until anion gap closes  Family Communication: no  Disposition Plan: Pending clinical course Consults called: Nephrology Admission  status: Inpatient, stepdown  Past Medical History:  Diagnosis Date   Adjustment disorder with mixed anxiety and depressed mood    Anemia due to chronic kidney disease    Asthma    Cannabis use disorder    Diabetes mellitus type 2, insulin  dependent (HCC)    DKA (diabetic ketoacidosis) (HCC)    End stage renal disease on dialysis (HCC)    Hyperlipidemia    Hypertension associated with type 2 diabetes mellitus (HCC)    Intractable vomiting with nausea 10/06/2023   Obstructive sleep apnea    Past Surgical History:  Procedure Laterality Date   A/V FISTULAGRAM Left 09/11/2023   Procedure: A/V Fistulagram;  Surgeon: Marea Selinda RAMAN, MD;  Location: [facility] INVASIVE CV LAB;  Service: Cardiovascular;  Laterality: Left;   A/V FISTULAGRAM Left 10/08/2023   Procedure: A/V FISTULAGRAM;  Surgeon: Marea Selinda RAMAN, MD;  Location: [facility] INVASIVE CV LAB;  Service: Cardiovascular;  Laterality: Left;   AV FISTULA PLACEMENT Left 07/17/2023   Procedure: ARTERIOVENOUS (AV) FISTULA CREATION (RADIALCEPHALIC);  Surgeon: Marea Selinda RAMAN, MD;  Location: [facility] ORS;  Service: Vascular;  Laterality: Left;   CAPD REMOVAL N/A 07/17/2023   Procedure: CONTINUOUS AMBULATORY PERITONEAL DIALYSIS  (CAPD) CATHETER REMOVAL;  Surgeon: Marea Selinda RAMAN, MD;  Location: [facility] ORS;  Service: Vascular;  Laterality: N/A;   DIALYSIS/PERMA CATHETER INSERTION  10/2022   PARS PLANA VITRECTOMY W/ ENDOLASER PANRETINAL PHOTOCOAGULATION Right 06/29/2018   PERITONEAL CATHETER INSERTION  03/04/2023   TONSILLECTOMY AND ADENOIDECTOMY  2002   Social History:  reports that he quit smoking about 5 months ago. His smoking use included cigarettes. He has never used smokeless tobacco. He reports that he does not currently use alcohol. He reports current drug use. Drug: Marijuana.  No Known Allergies Family History  Problem Relation Age of Onset   Healthy Mother    Cataracts Maternal Grandmother    Cataracts Maternal Grandfather    Family history: Family history  reviewed and not pertinent.  Prior to Admission medications   Medication Sig Start Date End Date Taking? Authorizing Provider  amLODipine  (NORVASC ) 10 MG tablet Take 10 mg by mouth daily.   Yes [provider]  atorvastatin  (LIPITOR) 20 MG tablet Take 1 tablet by mouth daily. 04/16/22 08/27/24 Yes [provider]  Calcium  Acetate 667 MG TABS Take 1,334 mg by mouth 3 (three) times daily with meals.   Yes [provider]  albuterol (VENTOLIN HFA) 108 (90 Base) MCG/ACT inhaler Inhale 2 puffs into the lungs every 6 (six) hours as needed for wheezing or shortness of breath. Patient not taking: Reported on 09/01/2023    [provider]  carvedilol  (COREG ) 25 MG tablet Take 25 mg by mouth 2 (two) times daily. 03/18/22   [provider]  cloNIDine  (CATAPRES ) 0.3 MG tablet Take 0.3 mg by mouth 3 (three) times daily. 09/06/23   [provider]  Continuous Glucose Receiver (DEXCOM G7 RECEIVER) DEVI by Does not apply route.    [provider]  Continuous Glucose Sensor (DEXCOM G7 SENSOR) MISC by Does not apply route.    [provider]  Continuous Glucose Sensor (DEXCOM G7 SENSOR) MISC Change dexcom sensor every 10 days. 3 sensors. No refills 10/14/23   Trudy Anthony HERO, MD  doxazosin  (CARDURA ) 4 MG tablet Take 4 mg by mouth 2 (two) times daily.    [provider]  EPINEPHrine  0.3 MG/0.3ML SOSY Inject 0.3  mg into the muscle once.    [provider]  furosemide  (LASIX ) 80 MG tablet Take 80 mg by mouth 2 (two) times daily. 10/02/23   [provider]  gabapentin  (NEURONTIN ) 100 MG capsule Take 100 mg by mouth 3 (three) times a week. With dialysis M-W-F    [provider]  hydrALAZINE  (APRESOLINE ) 100 MG tablet Take 100 mg by mouth 3 (three) times daily.    [provider]  insulin  glargine (LANTUS ) 100 UNIT/ML injection Inject 12 Units into the skin daily with supper.    [provider]   insulin  lispro (HUMALOG) 100 UNIT/ML KwikPen Inject 2-4 Units into the skin 3 (three) times daily with meals. 03/19/22   [provider]  Insulin  Pen Needle 29G X MISC Please dispense 1 box of 100 insulin  needles and syringes 09/23/19   Arlander Charleston, MD  irbesartan  (AVAPRO ) 300 MG tablet Take 300 mg by mouth at bedtime.    [provider]  mirtazapine  (REMERON ) 15 MG tablet Take 15 mg by mouth at bedtime.    [provider]  promethazine  (PHENERGAN ) 12.5 MG tablet Take 12.5 mg by mouth every 6 (six) hours as needed for nausea or vomiting.    [provider]  sertraline  (ZOLOFT ) 50 MG tablet Take 50 mg by mouth daily.    [provider]  sodium bicarbonate 650 MG tablet Take 650 mg by mouth 2 (two) times daily.    [provider]  traZODone  (DESYREL ) 50 MG tablet Take 50 mg by mouth at bedtime.    [provider]  vancomycin  (VANCOREADY) 750 MG/150ML SOLN Inject 150 mLs (750 mg total) into the vein every Monday, Wednesday, and Friday with hemodialysis. 10/15/23   Trudy Anthony HERO, MD    Physical Exam: Vitals:   11/19/23 1423 11/19/23 1500 11/19/23 1534 11/19/23 1700  BP: (!) 216/90 (!) 204/94 (!) 206/98 (!) 204/97  Pulse: (!) 118 (!) 108 (!) 102 (!) 109  Resp: 20  (!) 26 17  Temp:   98.3 F (36.8 C)   TempSrc:   Oral   SpO2: 100% 100% 100% 100%  Weight:      Height:   5' 11 (1.803 m)    Constitutional: appears older than chronological age, acute uncomfortable  Eyes: PERRL, lids and conjunctivae normal ENMT: Mucous membranes are dry. Posterior pharynx clear of any exudate or lesions. Age-appropriate dentition. Hearing appropriate Neck: normal, supple, no masses, no thyromegaly Respiratory: clear to auscultation bilaterally, no wheezing, no crackles. Normal respiratory effort. No accessory muscle use.  Cardiovascular: Regular rate and rhythm, no murmurs / rubs / gallops. No extremity edema. 2+ pedal pulses. No carotid  bruits.  Abdomen: no tenderness, no masses palpated, no hepatosplenomegaly. Bowel sounds positive.  Musculoskeletal: no clubbing / cyanosis. No joint deformity upper and lower extremities. Good ROM, no contractures, no atrophy. Normal muscle tone.  Skin: no rashes, lesions, ulcers. No induration Neurologic: Sensation intact. Strength 5/5 in all 4.  Psychiatric: Normal judgment and insight. Alert and oriented x 3. Normal mood.   EKG: independently reviewed, showing sinus tachycardia with rate of 117, QTc 455  Chest x-ray on Admission: I personally reviewed and I agree with radiologist reading as below.  Portable chest x-ray (1 view) Result Date: 11/19/2023 CLINICAL DATA:  Diabetic ketoacidosis. EXAM: PORTABLE CHEST 1 VIEW COMPARISON:  10/05/2023. FINDINGS: Trachea is midline. Heart size is accentuated by AP semi upright technique. Right IJ dialysis catheter tip is in the right atrium. Lungs are  clear. No pleural fluid. IMPRESSION: No acute findings. Electronically Signed   By: Newell Eke M.D.   On: 11/19/2023 15:22   Labs on Admission: I have personally reviewed following labs CBC: Recent Labs  Lab 11/19/23 1227  WBC 15.4*  NEUTROABS 13.6*  HGB 13.8  HCT 42.2  MCV 88.1  PLT 310   Basic Metabolic Panel: Recent Labs  Lab 11/19/23 1227 11/19/23 1554  NA 140  --   K 4.8  --   CL 97*  --   CO2 20*  --   GLUCOSE 245*  --   BUN 73*  --   CREATININE 14.53*  --   CALCIUM  10.1  --   MG  --  2.6*  PHOS  --  6.1*   GFR: Estimated Creatinine Clearance: 7.8 mL/min (A) (by C-G formula based on SCr of 14.53 mg/dL (H)).  Liver Function Tests: Recent Labs  Lab 11/19/23 1227  AST 25  ALT 20  ALKPHOS 100  BILITOT 1.0  PROT 7.8  ALBUMIN 4.1   Recent Labs  Lab 11/19/23 1227  LIPASE 22   CBG: Recent Labs  Lab 11/19/23 1449 11/19/23 1543 11/19/23 1706  GLUCAP 261* 265* 160*   Urine analysis:    Component Value Date/Time   COLORURINE YELLOW (A) 05/16/2022 0410    APPEARANCEUR HAZY (A) 05/16/2022 0410   LABSPEC 1.012 05/16/2022 0410   PHURINE 5.0 05/16/2022 0410   GLUCOSEU >=500 (A) 05/16/2022 0410   HGBUR SMALL (A) 05/16/2022 0410   BILIRUBINUR NEGATIVE 05/16/2022 0410   KETONESUR NEGATIVE 05/16/2022 0410   PROTEINUR >=300 (A) 05/16/2022 0410   NITRITE NEGATIVE 05/16/2022 0410   LEUKOCYTESUR NEGATIVE 05/16/2022 0410   CRITICAL CARE Performed by: Dr. Sherre  Total critical care time: 32 minutes  Critical care time was exclusive of separately billable procedures and treating other patients.  Critical care was necessary to treat or prevent imminent or life-threatening deterioration.  Critical care was time spent personally by me on the following activities: development of treatment plan with patient as well as nursing, discussions with consultants, evaluation of patient's response to treatment, examination of patient, obtaining history from patient or surrogate, ordering and performing treatments and interventions, ordering and review of laboratory studies, ordering and review of radiographic studies, pulse oximetry and re-evaluation of patient's condition.  This document was prepared using Dragon Voice Recognition software and may include unintentional dictation errors.  Dr. Sherre [facility]  If 7PM-7AM, please contact overnight-coverage provider If 7AM-7PM, please contact day attending provider [email or web address]  11/19/2023, 5:56 PM

## 2023-11-19 NOTE — ED Triage Notes (Signed)
 Patient to ED via [facility] from home. Patient has had several episodes of nausea and vomiting since yesterday. Patient is a MWF dialysis patient getting a kidney transplant in August. Patient has not missed any dialysis Tx except today. Patient has Hx of HTN but threw up his BP meds today. Patient was given 4 mg zofran , amp bicarb, and an amp of calcium  chloride with EMS. Patient has Hx of diabetes. Patient A&Ox4 and lives at home with his mom.  EMS Vitals: 223/116 120 HR 98.7 CBG 227

## 2023-11-19 NOTE — ED Provider Notes (Signed)
 [facility] Provider Note   Event Date/Time   First MD Initiated Contact with Patient 11/19/23 1216     (approximate) History  Nausea and Emesis  HPI Darrell Walls is a 33 y.o. male with a past medical history of type 1 diabetes, end-stage renal disease on dialysis M/W/F, cannabinoid use disorder, history of overdose who presents via EMS with concerns of nausea and vomiting since yesterday.  Patient missed his dialysis appointment this morning due to this nausea and vomiting.  Patient states that he has thrown up all the medications that he tried to take this morning as well.  Patient was given 4 mg of Zofran , amp of bicarb, and amp of calcium  chloride with EMS en route due to peaked T waves on his EKG.  Patient denies any recent travel, sick contacts, or food out of the ordinary.  Patient endorses associated generalized abdominal pain ROS: Patient currently denies any vision changes, tinnitus, difficulty speaking, facial droop, sore throat, chest pain, shortness of breath, diarrhea, dysuria, or weakness/numbness/paresthesias in any extremity   Physical Exam  Triage Vital Signs: ED Triage Vitals [11/19/23 1219]  Encounter Vitals Group     BP (!) 227/103     Girls Systolic BP Percentile      Girls Diastolic BP Percentile      Boys Systolic BP Percentile      Boys Diastolic BP Percentile      Pulse Rate (!) 120     Resp 18     Temp 98.7 F (37.1 C)     Temp Source Oral     SpO2 99 %     Weight      Height      Head Circumference      Peak Flow      Pain Score      Pain Loc      Pain Education      Exclude from Growth Chart    Most recent vital signs: Vitals:   11/19/23 1300 11/19/23 1423  BP: (!) 184/100 (!) 216/90  Pulse: 99 (!) 118  Resp: 10 20  Temp:    SpO2: 95% 100%   General: Awake, oriented x4. CV:  Good peripheral perfusion. Resp:  Normal effort. Abd:  No distention. Other:  Developed, well-nourished Caucasian male resting on stretcher  tremulous ED Results / Procedures / Treatments  Labs (all labs ordered are listed, but only abnormal results are displayed) Labs Reviewed  COMPREHENSIVE METABOLIC PANEL WITH GFR - Abnormal; Notable for the following components:      Result Value   Chloride 97 (*)    CO2 20 (*)    Glucose, Bld 245 (*)    BUN 73 (*)    Creatinine, Ser 14.53 (*)    GFR, Estimated 4 (*)    Anion gap 23 (*)    All other components within normal limits  CBC WITH DIFFERENTIAL/PLATELET - Abnormal; Notable for the following components:   WBC 15.4 (*)    RDW 16.9 (*)    Neutro Abs 13.6 (*)    All other components within normal limits  BLOOD GAS, VENOUS - Abnormal; Notable for the following components:   pH, Ven 7.54 (*)    pCO2, Ven 28 (*)    pO2, Ven 68 (*)    Acid-Base Excess 2.3 (*)    All other components within normal limits  BETA-HYDROXYBUTYRIC ACID - Abnormal; Notable for the following components:   Beta-Hydroxybutyric Acid 1.10 (*)  All other components within normal limits  CBG MONITORING, ED - Abnormal; Notable for the following components:   Glucose-Capillary 261 (*)    All other components within normal limits  LIPASE, BLOOD  URINE DRUG SCREEN, QUALITATIVE ([facility] ONLY)  BASIC METABOLIC PANEL WITH GFR  BASIC METABOLIC PANEL WITH GFR  BASIC METABOLIC PANEL WITH GFR  MAGNESIUM  PHOSPHORUS  HEPATITIS B SURFACE ANTIGEN   EKG ED ECG REPORT I, Artist MARLA Kerns, the attending physician, personally viewed and interpreted this ECG. Date: 11/19/2023 EKG Time: 1217 Rate: 117 Rhythm: Tachycardic sinus rhythm QRS Axis: normal Intervals: normal ST/T Wave abnormalities: normal Narrative Interpretation: Tachycardic sinus rhythm.  No evidence of acute ischemia PROCEDURES: Critical Care performed: Yes, see critical care procedure note(s) .1-3 Lead EKG Interpretation  Performed by: Kerns Artist MARLA, MD Authorized by: Kerns Artist MARLA, MD     Interpretation: abnormal     ECG rate:  111   ECG  rate assessment: tachycardic     Rhythm: sinus tachycardia     Ectopy: none     Conduction: normal   CRITICAL CARE Performed by: Zarai Orsborn K Tzipora Mcinroy  Total critical care time: 41 minutes  Critical care time was exclusive of separately billable procedures and treating other patients.  Critical care was necessary to treat or prevent imminent or life-threatening deterioration.  Critical care was time spent personally by me on the following activities: development of treatment plan with patient and/or surrogate as well as nursing, discussions with consultants, evaluation of patient's response to treatment, examination of patient, obtaining history from patient or surrogate, ordering and performing treatments and interventions, ordering and review of laboratory studies, ordering and review of radiographic studies, pulse oximetry and re-evaluation of patient's condition.  MEDICATIONS ORDERED IN ED: Medications  iohexol (OMNIPAQUE) 300 MG/ML solution 100 mL (has no administration in time range)  dextrose  5 % in lactated ringers  infusion (has no administration in time range)  dextrose  50 % solution 0-50 mL (has no administration in time range)  potassium chloride  10 mEq in 100 mL IVPB (10 mEq Intravenous New Bag/Given 11/19/23 1500)  insulin  regular, human (MYXREDLIN ) 100 units/ 100 mL infusion (has no administration in time range)  lactated ringers  infusion (has no administration in time range)  heparin  injection 5,000 Units (has no administration in time range)  labetalol  (NORMODYNE ) injection 20 mg (20 mg Intravenous Given 11/19/23 1231)  famotidine  (PEPCID ) IVPB 20 mg premix (0 mg Intravenous Stopped 11/19/23 1309)  lactated ringers  bolus 1,496 mL (1,496 mLs Intravenous New Bag/Given 11/19/23 1457)  labetalol  (NORMODYNE ) injection 10 mg (10 mg Intravenous Given 11/19/23 1507)  ondansetron  (ZOFRAN ) injection 4 mg (4 mg Intravenous Given 11/19/23 1505)   IMPRESSION / MDM / ASSESSMENT AND PLAN / ED  COURSE  I reviewed the triage vital signs and the nursing notes.                             The patient is on the cardiac monitor to evaluate for evidence of arrhythmia and/or significant heart rate changes. Patient's presentation is most consistent with acute presentation with potential threat to life or bodily function. Patient's presentation most consistent with hyperglycemic state WITH evidence of DKA. Given Exam, History, and Workup I have low suspicion for an emergent precipitating factor of this hyperglycemic state such as atypical MI, acute abdomen, or other serious bacterial illness. Patient is type I diabetic with nonadherence in medication regimen/adherence. pH: 7.54 Potassium: 4.8  Patient  started on continuous fluid boluses of LR, insulin  drip, potassium chloride  supplemented as needed given potassium level at each BMP Reassessment: Mental/respiratory status improving Dispo: Admit    FINAL CLINICAL IMPRESSION(S) / ED DIAGNOSES   Final diagnoses:  Diabetic ketoacidosis without coma associated with type 1 diabetes mellitus (HCC)  Nausea and vomiting, unspecified vomiting type   Rx / DC Orders   ED Discharge Orders     None      Note:  This document was prepared using Dragon voice recognition software and may include unintentional dictation errors.   Brynlei Klausner K, MD 11/19/23 [phone]

## 2023-11-19 NOTE — Assessment & Plan Note (Signed)
 Initial value was 37 I suspect this is secondary to demand ischemia in setting of hypertensive urgency Will continue to follow second HS troponin, if there is a positive delta we will initiate heparin  GTT

## 2023-11-19 NOTE — Assessment & Plan Note (Signed)
 CPAP nightly ordered

## 2023-11-19 NOTE — Assessment & Plan Note (Addendum)
 One-time dose of labetalol  20 mg once ordered on admission Secondary to patient not taking his medications for several days Home Coreg  25 mg p.o. twice daily, clonidine  0.3 mg 3 times daily, doxazosin  4 mg twice daily, furosemide  80 mg twice daily, hydralazine  100 mg 3 times daily, irbesartan  300 mg nightly, amlodipine  10 mg daily were resumed on admission Hydralazine  10 mg every 4 hours as needed for SBP greater 180, 1 day ordered

## 2023-11-19 NOTE — Assessment & Plan Note (Signed)
 -  As needed nicotine patch ordered ?

## 2023-11-19 NOTE — Progress Notes (Signed)
 Patient ordered on CPAP at night. Patient stated has been diagnosed with OSA but doesn't wear a CPAP at home.

## 2023-11-19 NOTE — Assessment & Plan Note (Signed)
 Check portable chest x-ray, UDS, high sensitivity troponin Plan per Endo tool N.p.o. except for noncaloric beverages BMP every 4 hours Admit to stepdown, inpatient

## 2023-11-20 ENCOUNTER — Emergency Department
Admission: EM | Admit: 2023-11-20 | Discharge: 2023-11-20 | Disposition: A | Discharge status: Home / Self Care | Attending: Emergency Medicine | Admitting: Emergency Medicine

## 2023-11-20 DIAGNOSIS — E1010 Type 1 diabetes mellitus with ketoacidosis without coma: Secondary | ICD-10-CM | POA: Diagnosis not present

## 2023-11-20 DIAGNOSIS — T7840XA Allergy, unspecified, initial encounter: Secondary | ICD-10-CM | POA: Diagnosis present

## 2023-11-20 LAB — BASIC METABOLIC PANEL WITH GFR
Anion gap: 18 — ABNORMAL HIGH (ref 5–15)
Anion gap: 16 — ABNORMAL HIGH (ref 5–15)
BUN: 76 mg/dL — ABNORMAL HIGH (ref 6–20)
BUN: 53 mg/dL — ABNORMAL HIGH (ref 6–20)
CO2: 23 mmol/L (ref 22–32)
CO2: 24 mmol/L (ref 22–32)
Calcium: 9.2 mg/dL (ref 8.9–10.3)
Calcium: 8.7 mg/dL — ABNORMAL LOW (ref 8.9–10.3)
Chloride: 103 mmol/L (ref 98–111)
Chloride: 100 mmol/L (ref 98–111)
Creatinine, Ser: 15.96 mg/dL — ABNORMAL HIGH (ref 0.61–1.24)
Creatinine, Ser: 12.6 mg/dL — ABNORMAL HIGH (ref 0.61–1.24)
GFR, Estimated: 4 mL/min — ABNORMAL LOW (ref >60)
GFR, Estimated: 5 mL/min — ABNORMAL LOW (ref >60)
Glucose, Bld: 158 mg/dL — ABNORMAL HIGH (ref 70–99)
Glucose, Bld: 190 mg/dL — ABNORMAL HIGH (ref 70–99)
Potassium: 4.8 mmol/L (ref 3.5–5.1)
Potassium: 4.8 mmol/L (ref 3.5–5.1)
Sodium: 144 mmol/L (ref 135–145)
Sodium: 140 mmol/L (ref 135–145)

## 2023-11-20 LAB — CBC WITH DIFFERENTIAL/PLATELET
Abs Immature Granulocytes: 0.09 10*3/uL — ABNORMAL HIGH (ref 0.00–0.07)
Basophils Absolute: 0.1 10*3/uL (ref 0.0–0.1)
Basophils Relative: 0 %
Eosinophils Absolute: 0 10*3/uL (ref 0.0–0.5)
Eosinophils Relative: 0 %
HCT: 38.4 % — ABNORMAL LOW (ref 39.0–52.0)
Hemoglobin: 12.2 g/dL — ABNORMAL LOW (ref 13.0–17.0)
Immature Granulocytes: 1 %
Lymphocytes Relative: 13 %
Lymphs Abs: 1.8 10*3/uL (ref 0.7–4.0)
MCH: 29.1 pg (ref 26.0–34.0)
MCHC: 31.8 g/dL (ref 30.0–36.0)
MCV: 91.6 fL (ref 80.0–100.0)
Monocytes Absolute: 1.1 10*3/uL — ABNORMAL HIGH (ref 0.1–1.0)
Monocytes Relative: 8 %
Neutro Abs: 10.9 10*3/uL — ABNORMAL HIGH (ref 1.7–7.7)
Neutrophils Relative %: 78 %
Platelets: 251 10*3/uL (ref 150–400)
RBC: 4.19 MIL/uL — ABNORMAL LOW (ref 4.22–5.81)
RDW: 17.1 % — ABNORMAL HIGH (ref 11.5–15.5)
WBC: 13.9 10*3/uL — ABNORMAL HIGH (ref 4.0–10.5)
nRBC: 0.1 % (ref 0.0–0.2)

## 2023-11-20 LAB — GLUCOSE, CAPILLARY
Glucose-Capillary: 124 mg/dL — ABNORMAL HIGH (ref 70–99)
Glucose-Capillary: 137 mg/dL — ABNORMAL HIGH (ref 70–99)
Glucose-Capillary: 152 mg/dL — ABNORMAL HIGH (ref 70–99)
Glucose-Capillary: 154 mg/dL — ABNORMAL HIGH (ref 70–99)
Glucose-Capillary: 171 mg/dL — ABNORMAL HIGH (ref 70–99)

## 2023-11-20 MED ORDER — ENALAPRILAT 1.25 MG/ML IV SOLN
1.2500 mg | Freq: Once | INTRAVENOUS | Status: AC
  Administered 2023-11-20: 1.25 mg via INTRAVENOUS
  Filled 2023-11-20: qty 1

## 2023-11-20 MED ORDER — HYDRALAZINE HCL 20 MG/ML IJ SOLN: 20.0000 mg | INTRAMUSCULAR | Status: DC | PRN

## 2023-11-20 MED ORDER — METOCLOPRAMIDE HCL 5 MG/ML IJ SOLN: 10.0000 mg | Freq: Four times a day (QID) | INTRAMUSCULAR | Status: DC

## 2023-11-20 MED ORDER — LABETALOL HCL 5 MG/ML IV SOLN
20.0000 mg | Freq: Once | INTRAVENOUS | Status: AC
  Administered 2023-11-20: 20 mg via INTRAVENOUS
  Filled 2023-11-20: qty 4

## 2023-11-20 MED ORDER — PROCHLORPERAZINE EDISYLATE 10 MG/2ML IJ SOLN
10.0000 mg | Freq: Four times a day (QID) | INTRAMUSCULAR | Status: DC | PRN
  Administered 2023-11-20: 10 mg via INTRAVENOUS
  Filled 2023-11-20: qty 2

## 2023-11-20 NOTE — Progress Notes (Signed)
~  [id number] Patient called out requesting to refuse service and to leave. Attempted to provided therapeutic communication to ask what his concerns were but the patient kept repeating over and over that he is refusing service and wanting to go to another hospital. He did not like the service he getting here but would not elaborate.   I provided education regarding his insulin  drip. High blood pressure and that he would be getting dialysis this morning. I explained the risk but patient still insisted so Dr cleatus was paged.  ~0520 patient insistent on not waiting on the provider and wanting everything removed to leave.  ~0530 Dr cleatus at bedside while we were removing Ivs from patient. Patient again insistent on leaving even after speaking to provider. Education reinforced by [employer] and primary nurse Danita.AMA paperwork signed and patient escorted out by security.

## 2023-11-20 NOTE — Discharge Summary (Addendum)
 Physician Discharge Summary   Patient: Darrell Walls MRN: [id number] DOB: 12/16/1990  Admit date:     11/19/2023  Discharge date: 11/20/23  Discharge Physician: Delayne LULLA Solian   PCP: Mallie Glisson, MD   AMA discharge summary    Discharge Diagnoses: Principal Problem:   DKA (diabetic ketoacidosis) (HCC) Active Problems:   Hypertensive urgency   ESRD on hemodialysis (HCC)   HTN (hypertension)   OSA on CPAP   Nicotine dependence, unspecified, uncomplicated   Elevated troponin  Resolved Problems:   * No resolved hospital problems. [facility] Course: Darrell Walls is a 33 year old male with history of insulin -dependent diabetes mellitus, hypertension, hyperlipidemia, ESRD on HD MWF, with AV fistula placement on 10/08/2023, anemia of chronic kidney disease, admitted on 6/25 with hypertensive urgency and intractable vomiting in the setting of going several days without taking his antihypertensives.  Several antihypertensives were tried over the course of his admission.  At around 5 AM on 6/25 I was called urgently to bedside as patient was demanding to have his IVs and monitoring equipment discontinued as he was signing out AGAINST MEDICAL ADVICE. Patient was awake and alert and oriented x 4.  He was angry.  Security/hospital police were at bedside.  He stated that his mother would come and pick him up.  I discussed the risk of death and disability if he chose to leave the hospital and he voiced understanding and was able to repeated back to me when I asked him if he understood.  He stated he was going to another hospital immediately because he was unhappy with the care he was getting.  We asked if there was anything we could do to make him changes mind due to the danger of leaving AMA. I explained that he was at a high risk of dying before making it to another hospital. He stated he was leaving the hospital and there was nothing we could do.  We tried to get him to call his mother but he  could not get her on the phone.  He stated he will sit outside the hospital and wait for her.  He was escorted out by hospital police  Please see H&P as well as cross cover notes for further details of his stay    Discharge Exam: Filed Weights   11/19/23 1221  Weight: 74.8 kg   Physical Exam Constitutional:      Comments: Patient alert and oriented x 4 with angry mood and demanding bedside nurses to remove his IV.      Condition at discharge: critical  The results of significant diagnostics from this hospitalization (including imaging, microbiology, ancillary and laboratory) are listed below for reference.   Imaging Studies: Portable chest x-ray (1 view) Result Date: 11/19/2023 CLINICAL DATA:  Diabetic ketoacidosis. EXAM: PORTABLE CHEST 1 VIEW COMPARISON:  10/05/2023. FINDINGS: Trachea is midline. Heart size is accentuated by AP semi upright technique. Right IJ dialysis catheter tip is in the right atrium. Lungs are clear. No pleural fluid. IMPRESSION: No acute findings. Electronically Signed   By: Newell Eke M.D.   On: 11/19/2023 15:22    Microbiology: Results for orders placed or performed during the hospital encounter of 11/19/23  MRSA Next Gen by PCR, Nasal     Status: None   Collection Time: 11/19/23  3:50 PM   Specimen: Nasal Mucosa; Nasal Swab  Result Value Ref Range Status   MRSA by PCR Next Gen NOT DETECTED NOT DETECTED Final    Comment: (NOTE)  The GeneXpert MRSA Assay (FDA approved for NASAL specimens only), is one component of a comprehensive MRSA colonization surveillance program. It is not intended to diagnose MRSA infection nor to guide or monitor treatment for MRSA infections. Test performance is not FDA approved in patients less than 88 years old. Performed at [facility], [address] [place], [place] [address]     Labs: CBC: Recent Labs  Lab 11/19/23 1227  WBC 15.4*  NEUTROABS 13.6*  HGB 13.8  HCT 42.2  MCV 88.1  PLT 310    Basic Metabolic Panel: Recent Labs  Lab 11/19/23 1227 11/19/23 1554 11/19/23 1912 11/19/23 2305 11/20/23 0324  NA 140  --  141 144 144  K 4.8  --  5.0 4.9 4.8  CL 97*  --  101 102 103  CO2 20*  --  21* 23 23  GLUCOSE 245*  --  163* 132* 158*  BUN 73*  --  75* 76* 76*  CREATININE 14.53*  --  14.93* 15.21* 15.96*  CALCIUM  10.1  --  9.1 9.3 9.2  MG  --  2.6*  --   --   --   PHOS  --  6.1*  --   --   --    Liver Function Tests: Recent Labs  Lab 11/19/23 1227  AST 25  ALT 20  ALKPHOS 100  BILITOT 1.0  PROT 7.8  ALBUMIN 4.1   CBG: Recent Labs  Lab 11/20/23 0002 11/20/23 0201 11/20/23 0302 11/20/23 0403 11/20/23 0506  GLUCAP 154* 171* 152* 137* 124*    Discharge time spent: less than 30 minutes.  Signed: Delayne LULLA Solian, MD [facility] 11/20/2023

## 2023-11-20 NOTE — Progress Notes (Addendum)
 Pt continuously requesting water  despite constant vomiting all night. Pt educated that he is NPO at this time. Pt called out requesting to use the bathroom. Walked pt over to toilet. Provided pt with privacy. While standing outside the door, this RN could hear water  faucet running. RN then pulled the curtain and saw pt drinking water  from the sink. Advised pt to not drink water  and educated him that he is NPO. Pt assisted back to bed and placed back on cardiac monitor. Pt verbally aggressive with this RN. RN attempted to verbally deescalate pt. Pt stating he was feeling anxious. PRN ativan  offered but pt refused. Pt requesting to step outside. Educated pt that he is on insulin  drip and can not be turned off.  Pt then called out stating he wants to leave AMA. Charge RN at the bedside. Dr. Cleatus paged and at the bedside. Dr. Cleatus educated pt on risks about leaving AMA including death. Pt verbally stated he understands the risks about leaving AMA. Pt is AXOx4. Removed two PIVs and removed from the cardiac monitor. Pt dressed in his own clothing. Security at the bedside to escort pt out.

## 2023-11-20 NOTE — ED Provider Notes (Signed)
 [facility] Provider Note    Event Date/Time   First MD Initiated Contact with Patient 11/20/23 2147     (approximate)   History   Allergic Reaction   HPI  Caidyn Boullion is a 33 y.o. male who presents to the emergency department today because of concerns for possible allergic reaction.  Patient says that his tongue started to swell this afternoon.  Denies any rash. At the time my exam shortly after arrival to the emergency department he states he is feeling better.  He did receive 3 separate doses of epi.  He gave himself 2 and EMS gave himself 1.  He was seen earlier in the day at [facility] for nausea and vomiting.  Additionally he had left [facility] this morning AMA after admission for possible DKA.     Physical Exam   Triage Vital Signs: ED Triage Vitals  Encounter Vitals Group     BP 11/20/23 2138 (!) 200/105     Girls Systolic BP Percentile --      Girls Diastolic BP Percentile --      Boys Systolic BP Percentile --      Boys Diastolic BP Percentile --      Pulse Rate 11/20/23 2138 (!) 102     Resp 11/20/23 2141 18     Temp 11/20/23 2138 98.9 F (37.2 C)     Temp Source 11/20/23 2138 Oral     SpO2 11/20/23 2138 98 %     Weight --      Height --      Head Circumference --      Peak Flow --      Pain Score 11/20/23 2139 0     Pain Loc --      Pain Education --      Exclude from Growth Chart --     Most recent vital signs: Vitals:   11/20/23 2138 11/20/23 2141  BP: (!) 200/105   Pulse: (!) 102   Resp:  18  Temp: 98.9 F (37.2 C)   SpO2: 98%    General: Awake, alert, oriented. CV:  Good peripheral perfusion. Tachycardic Resp:  Normal effort. Lungs clear Abd:  No distention.  Other:  Hemodialysis cath in right upper chest. No hives appreciated.   ED Results / Procedures / Treatments   Labs (all labs ordered are listed, but only abnormal results are displayed) Labs Reviewed  CBC WITH DIFFERENTIAL/PLATELET - Abnormal; Notable for  the following components:      Result Value   WBC 13.9 (*)    RBC 4.19 (*)    Hemoglobin 12.2 (*)    HCT 38.4 (*)    RDW 17.1 (*)    Neutro Abs 10.9 (*)    Monocytes Absolute 1.1 (*)    Abs Immature Granulocytes 0.09 (*)    All other components within normal limits  BASIC METABOLIC PANEL WITH GFR - Abnormal; Notable for the following components:   Glucose, Bld 190 (*)    BUN 53 (*)    Creatinine, Ser 12.60 (*)    Calcium  8.7 (*)    GFR, Estimated 5 (*)    Anion gap 16 (*)    All other components within normal limits  BLOOD GAS, VENOUS  URINALYSIS, ROUTINE W REFLEX MICROSCOPIC     EKG  I, Guadalupe Eagles, attending physician, personally viewed and interpreted this EKG  EKG Time: 2149 Rate: 98 Rhythm: sinus rhythm Axis: right axis deviation Intervals: qtc 460  QRS: narrow, q waves v1, v2 ST changes: no st elevation Impression: abnormal ekg   RADIOLOGY None  PROCEDURES:  Critical Care performed:  No    MEDICATIONS ORDERED IN ED: Medications - No data to display   IMPRESSION / MDM / ASSESSMENT AND PLAN / ED COURSE  I reviewed the triage vital signs and the nursing notes.                              Differential diagnosis includes, but is not limited to, allergic reaction, angioedema, DKA  Patient's presentation is most consistent with acute presentation with potential threat to life or bodily function.   The patient is on the cardiac monitor to evaluate for evidence of arrhythmia and/or significant heart rate changes.  Patient presented to the emergency department today after tongue swelling and receiving epinephrine .  At the time my exam there is no oropharyngeal swelling.  He states he feels better.  I did discuss with the patient that we would like to observe him for 4 hours after epinephrine  administration.  Additionally basic blood work was checked and did show elevated anion gap.  I did discuss my concern for DKA with patient however he declined  further blood work.  Prior to the 4-hour observation period being finished he stated he wanted to leave.  I did discuss with the patient both the danger of leaving in case epinephrine  wears off as well as my continued concern for possible DKA.  He verbalized understanding that he could die if he has DKA however he felt competent he did not.  I did recommend obtaining further blood work.  Patient however was insistent that he would leave the emergency department.      FINAL CLINICAL IMPRESSION(S) / ED DIAGNOSES   Final diagnoses:  Allergic reaction, initial encounter      Note:  This document was prepared using Dragon voice recognition software and may include unintentional dictation errors.    Floy Roberts, MD 11/20/23 [phone]

## 2023-11-20 NOTE — ED Triage Notes (Signed)
 Allergic reaction to possible new med started today (reglan ), hypertensive, tongue swelling noted puon EMS arrival, 2 epi taken at home and 0.3 mg epi given with EMS, 125 solumedrol, 50 mg benadryl , denies SHOB , tongue no longer swollen upon arrival to ER

## 2023-11-20 NOTE — Progress Notes (Signed)
 Pt's SBP continues to be >200 despite PRN medication given. Dr. Cleatus notified. See eMAR for new orders. Pt also continues to vomit. PRN phenergan  IVPB given with no relief. Attempted cooling fan and emotional support. See eMAR for new orders given for refractory nausea/vomiting.

## 2023-11-20 NOTE — Progress Notes (Addendum)
       CROSS COVER NOTE  NAME: Darrell Walls MRN: [id number] DOB : 12-17-1990    Concern as stated by nurse / staff   00:33 Hi Dr. Cleatus, Mr. Darrell Walls's SBP > 200 since he arrived. He hasn't been taking his PO BP meds d/t frequent vomiting. Could he benefit from clonidine  patch? I gave given him the PRN hydralazine  and PRN labetalol  with no effect. He is here for DKA. He is still on the insulin  drip. He hasn't been taking his BP meds or insulin  at home d/t nausea and vomiting x 5 days. He's planned for dialysis in the morning. His last BP was 209/94, HR 110.   Hey just wanted to let you know I gave the IV vasotec and BP is still 210/95  02:50 AM Also, he is still vomiting. The PRN phenegran does not help. Is there anything else he can get for vomiting?     Pertinent findings on chart review: Patient admitted on 6/25 with DKA and hypertensive urgency after not taking his medication for several days.  History of ESRD on dialysis schedule for dialysis in the a.m.  Per nurse, due to vomiting he was unable to tolerate any of his oral meds  Medications tried so far: Labetalol  20 mg IV x 2, hydralazine  IV as needed   Patient Assessment    11/20/2023    5:00 AM 11/20/2023    4:00 AM 11/20/2023    3:30 AM  Vitals with BMI  Systolic 209 204 803  Diastolic 104 87 95  Pulse 109       Assessment and  Interventions   Assessment:  Hypertensive urgency Intractable vomiting  Plan: Vasotec 1.25 mg IV x 1 ordered without effect followed by labetalol  20 mg IV x 1 with plans to continue as needed antihypertensives for better control Continue with prior management as outlined in H&P X

## 2023-11-20 NOTE — Discharge Instructions (Signed)
 As we discussed your blood work here is concerning for an enlarged anion gap, which can be a sign of diabetic keto acidosis which can kill you. We offered to perform more testing which you refused. Additionally you chose to leave prior to the four hour observation which we recommended for an allergic reaction. Please do return if you change your mind about treatment.

## 2024-02-02 ENCOUNTER — Other Ambulatory Visit
Admission: RE | Admit: 2024-02-02 | Discharge: 2024-02-02 | Disposition: A | Discharge status: Home / Self Care | Source: Ambulatory Visit | Attending: Nephrology | Admitting: Nephrology

## 2024-02-02 DIAGNOSIS — Z940 Kidney transplant status: Secondary | ICD-10-CM | POA: Diagnosis present

## 2024-02-02 DIAGNOSIS — Z114 Encounter for screening for human immunodeficiency virus [HIV]: Secondary | ICD-10-CM | POA: Insufficient documentation

## 2024-02-02 DIAGNOSIS — B259 Cytomegaloviral disease, unspecified: Secondary | ICD-10-CM | POA: Insufficient documentation

## 2024-02-02 DIAGNOSIS — Z789 Other specified health status: Secondary | ICD-10-CM | POA: Diagnosis not present

## 2024-02-02 DIAGNOSIS — Z9483 Pancreas transplant status: Secondary | ICD-10-CM | POA: Diagnosis not present

## 2024-02-02 DIAGNOSIS — N390 Urinary tract infection, site not specified: Secondary | ICD-10-CM | POA: Diagnosis not present

## 2024-02-02 DIAGNOSIS — Z79899 Other long term (current) drug therapy: Secondary | ICD-10-CM | POA: Insufficient documentation

## 2024-02-02 DIAGNOSIS — E1129 Type 2 diabetes mellitus with other diabetic kidney complication: Secondary | ICD-10-CM | POA: Diagnosis not present

## 2024-02-02 DIAGNOSIS — Z09 Encounter for follow-up examination after completed treatment for conditions other than malignant neoplasm: Secondary | ICD-10-CM | POA: Diagnosis not present

## 2024-02-02 DIAGNOSIS — D631 Anemia in chronic kidney disease: Secondary | ICD-10-CM | POA: Diagnosis not present

## 2024-02-02 DIAGNOSIS — T8610 Unspecified complication of kidney transplant: Secondary | ICD-10-CM | POA: Diagnosis not present

## 2024-02-02 DIAGNOSIS — E559 Vitamin D deficiency, unspecified: Secondary | ICD-10-CM | POA: Diagnosis present

## 2024-02-02 DIAGNOSIS — D899 Disorder involving the immune mechanism, unspecified: Secondary | ICD-10-CM | POA: Insufficient documentation

## 2024-02-02 LAB — CBC WITH DIFFERENTIAL/PLATELET
Abs Immature Granulocytes: 0.06 K/uL (ref 0.00–0.07)
Basophils Absolute: 0 K/uL (ref 0.0–0.1)
Basophils Relative: 1 %
Eosinophils Absolute: 0 K/uL (ref 0.0–0.5)
Eosinophils Relative: 0 %
HCT: 36.6 % — ABNORMAL LOW (ref 39.0–52.0)
Hemoglobin: 11.8 g/dL — ABNORMAL LOW (ref 13.0–17.0)
Immature Granulocytes: 1 %
Lymphocytes Relative: 2 %
Lymphs Abs: 0.2 K/uL — ABNORMAL LOW (ref 0.7–4.0)
MCH: 28.7 pg (ref 26.0–34.0)
MCHC: 32.2 g/dL (ref 30.0–36.0)
MCV: 89.1 fL (ref 80.0–100.0)
Monocytes Absolute: 0.3 K/uL (ref 0.1–1.0)
Monocytes Relative: 4 %
Neutro Abs: 6.4 K/uL (ref 1.7–7.7)
Neutrophils Relative %: 92 %
Platelets: 235 K/uL (ref 150–400)
RBC: 4.11 MIL/uL — ABNORMAL LOW (ref 4.22–5.81)
RDW: 17 % — ABNORMAL HIGH (ref 11.5–15.5)
WBC: 7 K/uL (ref 4.0–10.5)
nRBC: 0 % (ref 0.0–0.2)

## 2024-02-02 LAB — BASIC METABOLIC PANEL WITH GFR
Anion gap: 6 (ref 5–15)
BUN: 59 mg/dL — ABNORMAL HIGH (ref 6–20)
CO2: 20 mmol/L — ABNORMAL LOW (ref 22–32)
Calcium: 9.6 mg/dL (ref 8.9–10.3)
Chloride: 114 mmol/L — ABNORMAL HIGH (ref 98–111)
Creatinine, Ser: 1.9 mg/dL — ABNORMAL HIGH (ref 0.61–1.24)
GFR, Estimated: 47 mL/min — ABNORMAL LOW (ref >60)
Glucose, Bld: 214 mg/dL — ABNORMAL HIGH (ref 70–99)
Potassium: 5.5 mmol/L — ABNORMAL HIGH (ref 3.5–5.1)
Sodium: 140 mmol/L (ref 135–145)

## 2024-02-02 LAB — MAGNESIUM: Magnesium: 2.1 mg/dL (ref 1.7–2.4)

## 2024-02-02 LAB — PHOSPHORUS: Phosphorus: 2.4 mg/dL — ABNORMAL LOW (ref 2.5–4.6)

## 2024-02-04 LAB — TACROLIMUS LEVEL: Tacrolimus (FK506) - LabCorp: 9.3 ng/mL (ref 5.0–20.0)

## 2024-02-05 ENCOUNTER — Other Ambulatory Visit
Admission: RE | Admit: 2024-02-05 | Discharge: 2024-02-05 | Disposition: A | Discharge status: Home / Self Care | Attending: Nephrology | Admitting: Nephrology

## 2024-02-05 DIAGNOSIS — E1122 Type 2 diabetes mellitus with diabetic chronic kidney disease: Secondary | ICD-10-CM | POA: Insufficient documentation

## 2024-02-05 DIAGNOSIS — D631 Anemia in chronic kidney disease: Secondary | ICD-10-CM | POA: Diagnosis not present

## 2024-02-05 DIAGNOSIS — Z789 Other specified health status: Secondary | ICD-10-CM | POA: Diagnosis not present

## 2024-02-05 DIAGNOSIS — Z09 Encounter for follow-up examination after completed treatment for conditions other than malignant neoplasm: Secondary | ICD-10-CM | POA: Diagnosis not present

## 2024-02-05 DIAGNOSIS — Z79899 Other long term (current) drug therapy: Secondary | ICD-10-CM | POA: Insufficient documentation

## 2024-02-05 DIAGNOSIS — Z9483 Pancreas transplant status: Secondary | ICD-10-CM | POA: Insufficient documentation

## 2024-02-05 DIAGNOSIS — Z114 Encounter for screening for human immunodeficiency virus [HIV]: Secondary | ICD-10-CM | POA: Insufficient documentation

## 2024-02-05 DIAGNOSIS — N189 Chronic kidney disease, unspecified: Secondary | ICD-10-CM | POA: Diagnosis not present

## 2024-02-05 DIAGNOSIS — D899 Disorder involving the immune mechanism, unspecified: Secondary | ICD-10-CM | POA: Insufficient documentation

## 2024-02-05 DIAGNOSIS — Z940 Kidney transplant status: Secondary | ICD-10-CM | POA: Insufficient documentation

## 2024-02-05 DIAGNOSIS — E559 Vitamin D deficiency, unspecified: Secondary | ICD-10-CM | POA: Insufficient documentation

## 2024-02-05 LAB — CBC WITH DIFFERENTIAL/PLATELET
Abs Immature Granulocytes: 0.03 K/uL (ref 0.00–0.07)
Basophils Absolute: 0 K/uL (ref 0.0–0.1)
Basophils Relative: 1 %
Eosinophils Absolute: 0 K/uL (ref 0.0–0.5)
Eosinophils Relative: 0 %
HCT: 36.5 % — ABNORMAL LOW (ref 39.0–52.0)
Hemoglobin: 11.6 g/dL — ABNORMAL LOW (ref 13.0–17.0)
Immature Granulocytes: 1 %
Lymphocytes Relative: 5 %
Lymphs Abs: 0.2 K/uL — ABNORMAL LOW (ref 0.7–4.0)
MCH: 28.2 pg (ref 26.0–34.0)
MCHC: 31.8 g/dL (ref 30.0–36.0)
MCV: 88.6 fL (ref 80.0–100.0)
Monocytes Absolute: 0.3 K/uL (ref 0.1–1.0)
Monocytes Relative: 6 %
Neutro Abs: 3.9 K/uL (ref 1.7–7.7)
Neutrophils Relative %: 87 %
Platelets: 275 K/uL (ref 150–400)
RBC: 4.12 MIL/uL — ABNORMAL LOW (ref 4.22–5.81)
RDW: 16.7 % — ABNORMAL HIGH (ref 11.5–15.5)
WBC: 4.4 K/uL (ref 4.0–10.5)
nRBC: 0 % (ref 0.0–0.2)

## 2024-02-05 LAB — BASIC METABOLIC PANEL WITH GFR
Anion gap: 7 (ref 5–15)
BUN: 44 mg/dL — ABNORMAL HIGH (ref 6–20)
CO2: 23 mmol/L (ref 22–32)
Calcium: 9.8 mg/dL (ref 8.9–10.3)
Chloride: 111 mmol/L (ref 98–111)
Creatinine, Ser: 1.32 mg/dL — ABNORMAL HIGH (ref 0.61–1.24)
GFR, Estimated: >60 mL/min (ref >60)
Glucose, Bld: 127 mg/dL — ABNORMAL HIGH (ref 70–99)
Potassium: 4.9 mmol/L (ref 3.5–5.1)
Sodium: 141 mmol/L (ref 135–145)

## 2024-02-05 LAB — MAGNESIUM: Magnesium: 2 mg/dL (ref 1.7–2.4)

## 2024-02-05 LAB — PHOSPHORUS: Phosphorus: 2.5 mg/dL (ref 2.5–4.6)

## 2024-02-06 ENCOUNTER — Other Ambulatory Visit
Admission: RE | Admit: 2024-02-06 | Discharge: 2024-02-06 | Disposition: A | Discharge status: Home / Self Care | Attending: Nephrology | Admitting: Nephrology

## 2024-02-06 DIAGNOSIS — Z79899 Other long term (current) drug therapy: Secondary | ICD-10-CM | POA: Diagnosis not present

## 2024-02-06 DIAGNOSIS — D631 Anemia in chronic kidney disease: Secondary | ICD-10-CM | POA: Insufficient documentation

## 2024-02-06 DIAGNOSIS — Z09 Encounter for follow-up examination after completed treatment for conditions other than malignant neoplasm: Secondary | ICD-10-CM | POA: Insufficient documentation

## 2024-02-06 DIAGNOSIS — E1129 Type 2 diabetes mellitus with other diabetic kidney complication: Secondary | ICD-10-CM | POA: Diagnosis not present

## 2024-02-06 DIAGNOSIS — N390 Urinary tract infection, site not specified: Secondary | ICD-10-CM | POA: Insufficient documentation

## 2024-02-06 DIAGNOSIS — E559 Vitamin D deficiency, unspecified: Secondary | ICD-10-CM | POA: Diagnosis not present

## 2024-02-06 DIAGNOSIS — Z114 Encounter for screening for human immunodeficiency virus [HIV]: Secondary | ICD-10-CM | POA: Insufficient documentation

## 2024-02-06 DIAGNOSIS — Z789 Other specified health status: Secondary | ICD-10-CM | POA: Insufficient documentation

## 2024-02-06 DIAGNOSIS — Z940 Kidney transplant status: Secondary | ICD-10-CM | POA: Insufficient documentation

## 2024-02-06 DIAGNOSIS — N189 Chronic kidney disease, unspecified: Secondary | ICD-10-CM | POA: Insufficient documentation

## 2024-02-06 DIAGNOSIS — B259 Cytomegaloviral disease, unspecified: Secondary | ICD-10-CM | POA: Diagnosis not present

## 2024-02-06 DIAGNOSIS — D899 Disorder involving the immune mechanism, unspecified: Secondary | ICD-10-CM | POA: Diagnosis not present

## 2024-02-06 DIAGNOSIS — Z9483 Pancreas transplant status: Secondary | ICD-10-CM | POA: Insufficient documentation

## 2024-02-06 LAB — CBC WITH DIFFERENTIAL/PLATELET
Abs Immature Granulocytes: 0.03 K/uL (ref 0.00–0.07)
Basophils Absolute: 0.1 K/uL (ref 0.0–0.1)
Basophils Relative: 2 %
Eosinophils Absolute: 0 K/uL (ref 0.0–0.5)
Eosinophils Relative: 0 %
HCT: 37.5 % — ABNORMAL LOW (ref 39.0–52.0)
Hemoglobin: 11.7 g/dL — ABNORMAL LOW (ref 13.0–17.0)
Immature Granulocytes: 1 %
Lymphocytes Relative: 5 %
Lymphs Abs: 0.2 K/uL — ABNORMAL LOW (ref 0.7–4.0)
MCH: 27.5 pg (ref 26.0–34.0)
MCHC: 31.2 g/dL (ref 30.0–36.0)
MCV: 88.2 fL (ref 80.0–100.0)
Monocytes Absolute: 0.3 K/uL (ref 0.1–1.0)
Monocytes Relative: 6 %
Neutro Abs: 3.5 K/uL (ref 1.7–7.7)
Neutrophils Relative %: 86 %
Platelets: 268 K/uL (ref 150–400)
RBC: 4.25 MIL/uL (ref 4.22–5.81)
RDW: 16.6 % — ABNORMAL HIGH (ref 11.5–15.5)
WBC: 4.1 K/uL (ref 4.0–10.5)
nRBC: 0 % (ref 0.0–0.2)

## 2024-02-06 LAB — PHOSPHORUS: Phosphorus: 2.4 mg/dL — ABNORMAL LOW (ref 2.5–4.6)

## 2024-02-06 LAB — BASIC METABOLIC PANEL WITH GFR
Anion gap: 7 (ref 5–15)
BUN: 43 mg/dL — ABNORMAL HIGH (ref 6–20)
CO2: 24 mmol/L (ref 22–32)
Calcium: 9.9 mg/dL (ref 8.9–10.3)
Chloride: 109 mmol/L (ref 98–111)
Creatinine, Ser: 1.5 mg/dL — ABNORMAL HIGH (ref 0.61–1.24)
GFR, Estimated: >60 mL/min (ref >60)
Glucose, Bld: 191 mg/dL — ABNORMAL HIGH (ref 70–99)
Potassium: 5.6 mmol/L — ABNORMAL HIGH (ref 3.5–5.1)
Sodium: 140 mmol/L (ref 135–145)

## 2024-02-06 LAB — MAGNESIUM: Magnesium: 2.1 mg/dL (ref 1.7–2.4)

## 2024-02-07 LAB — TACROLIMUS LEVEL: Tacrolimus (FK506) - LabCorp: 7.8 ng/mL (ref 5.0–20.0)

## 2024-02-08 LAB — TACROLIMUS LEVEL: Tacrolimus (FK506) - LabCorp: 8.3 ng/mL (ref 5.0–20.0)

## 2024-02-09 ENCOUNTER — Other Ambulatory Visit
Admission: RE | Admit: 2024-02-09 | Discharge: 2024-02-09 | Disposition: A | Discharge status: Home / Self Care | Attending: Nephrology | Admitting: Nephrology

## 2024-02-09 DIAGNOSIS — D899 Disorder involving the immune mechanism, unspecified: Secondary | ICD-10-CM | POA: Insufficient documentation

## 2024-02-09 DIAGNOSIS — Z940 Kidney transplant status: Secondary | ICD-10-CM | POA: Insufficient documentation

## 2024-02-09 DIAGNOSIS — Z79899 Other long term (current) drug therapy: Secondary | ICD-10-CM | POA: Diagnosis not present

## 2024-02-09 DIAGNOSIS — Z114 Encounter for screening for human immunodeficiency virus [HIV]: Secondary | ICD-10-CM | POA: Insufficient documentation

## 2024-02-09 DIAGNOSIS — Z789 Other specified health status: Secondary | ICD-10-CM | POA: Diagnosis not present

## 2024-02-09 DIAGNOSIS — Z09 Encounter for follow-up examination after completed treatment for conditions other than malignant neoplasm: Secondary | ICD-10-CM | POA: Insufficient documentation

## 2024-02-09 DIAGNOSIS — X58XXXA Exposure to other specified factors, initial encounter: Secondary | ICD-10-CM | POA: Diagnosis not present

## 2024-02-09 DIAGNOSIS — T8610 Unspecified complication of kidney transplant: Secondary | ICD-10-CM | POA: Diagnosis not present

## 2024-02-09 DIAGNOSIS — D631 Anemia in chronic kidney disease: Secondary | ICD-10-CM | POA: Insufficient documentation

## 2024-02-09 DIAGNOSIS — B259 Cytomegaloviral disease, unspecified: Secondary | ICD-10-CM | POA: Insufficient documentation

## 2024-02-09 DIAGNOSIS — E1129 Type 2 diabetes mellitus with other diabetic kidney complication: Secondary | ICD-10-CM | POA: Diagnosis not present

## 2024-02-09 DIAGNOSIS — Z9483 Pancreas transplant status: Secondary | ICD-10-CM | POA: Insufficient documentation

## 2024-02-09 DIAGNOSIS — E559 Vitamin D deficiency, unspecified: Secondary | ICD-10-CM | POA: Diagnosis not present

## 2024-02-09 LAB — CBC WITH DIFFERENTIAL/PLATELET
Abs Immature Granulocytes: 0.04 K/uL (ref 0.00–0.07)
Basophils Absolute: 0 K/uL (ref 0.0–0.1)
Basophils Relative: 1 %
Eosinophils Absolute: 0 K/uL (ref 0.0–0.5)
Eosinophils Relative: 0 %
HCT: 34.1 % — ABNORMAL LOW (ref 39.0–52.0)
Hemoglobin: 10.8 g/dL — ABNORMAL LOW (ref 13.0–17.0)
Immature Granulocytes: 1 %
Lymphocytes Relative: 4 %
Lymphs Abs: 0.2 K/uL — ABNORMAL LOW (ref 0.7–4.0)
MCH: 27.8 pg (ref 26.0–34.0)
MCHC: 31.7 g/dL (ref 30.0–36.0)
MCV: 87.9 fL (ref 80.0–100.0)
Monocytes Absolute: 0.3 K/uL (ref 0.1–1.0)
Monocytes Relative: 7 %
Neutro Abs: 3.8 K/uL (ref 1.7–7.7)
Neutrophils Relative %: 87 %
Platelets: 219 K/uL (ref 150–400)
RBC: 3.88 MIL/uL — ABNORMAL LOW (ref 4.22–5.81)
RDW: 16.5 % — ABNORMAL HIGH (ref 11.5–15.5)
WBC: 4.3 K/uL (ref 4.0–10.5)
nRBC: 0 % (ref 0.0–0.2)

## 2024-02-09 LAB — MAGNESIUM: Magnesium: 2.1 mg/dL (ref 1.7–2.4)

## 2024-02-09 LAB — BASIC METABOLIC PANEL WITH GFR
Anion gap: 12 (ref 5–15)
BUN: 50 mg/dL — ABNORMAL HIGH (ref 6–20)
CO2: 24 mmol/L (ref 22–32)
Calcium: 9.8 mg/dL (ref 8.9–10.3)
Chloride: 108 mmol/L (ref 98–111)
Creatinine, Ser: 1.48 mg/dL — ABNORMAL HIGH (ref 0.61–1.24)
GFR, Estimated: >60 mL/min (ref >60)
Glucose, Bld: 175 mg/dL — ABNORMAL HIGH (ref 70–99)
Potassium: 5 mmol/L (ref 3.5–5.1)
Sodium: 144 mmol/L (ref 135–145)

## 2024-02-09 LAB — PHOSPHORUS: Phosphorus: 2.1 mg/dL — ABNORMAL LOW (ref 2.5–4.6)

## 2024-02-11 ENCOUNTER — Other Ambulatory Visit
Admission: RE | Admit: 2024-02-11 | Discharge: 2024-02-11 | Disposition: A | Discharge status: Home / Self Care | Attending: Nephrology | Admitting: Nephrology

## 2024-02-11 DIAGNOSIS — Z79899 Other long term (current) drug therapy: Secondary | ICD-10-CM | POA: Insufficient documentation

## 2024-02-11 DIAGNOSIS — Z940 Kidney transplant status: Secondary | ICD-10-CM | POA: Insufficient documentation

## 2024-02-11 DIAGNOSIS — Z09 Encounter for follow-up examination after completed treatment for conditions other than malignant neoplasm: Secondary | ICD-10-CM | POA: Diagnosis not present

## 2024-02-11 DIAGNOSIS — E1129 Type 2 diabetes mellitus with other diabetic kidney complication: Secondary | ICD-10-CM | POA: Diagnosis not present

## 2024-02-11 DIAGNOSIS — Z114 Encounter for screening for human immunodeficiency virus [HIV]: Secondary | ICD-10-CM | POA: Insufficient documentation

## 2024-02-11 DIAGNOSIS — Z789 Other specified health status: Secondary | ICD-10-CM | POA: Diagnosis not present

## 2024-02-11 DIAGNOSIS — D631 Anemia in chronic kidney disease: Secondary | ICD-10-CM | POA: Insufficient documentation

## 2024-02-11 DIAGNOSIS — N390 Urinary tract infection, site not specified: Secondary | ICD-10-CM | POA: Diagnosis not present

## 2024-02-11 DIAGNOSIS — D899 Disorder involving the immune mechanism, unspecified: Secondary | ICD-10-CM | POA: Diagnosis present

## 2024-02-11 DIAGNOSIS — N189 Chronic kidney disease, unspecified: Secondary | ICD-10-CM | POA: Insufficient documentation

## 2024-02-11 DIAGNOSIS — E559 Vitamin D deficiency, unspecified: Secondary | ICD-10-CM | POA: Diagnosis not present

## 2024-02-11 DIAGNOSIS — B259 Cytomegaloviral disease, unspecified: Secondary | ICD-10-CM | POA: Diagnosis not present

## 2024-02-11 DIAGNOSIS — Z9483 Pancreas transplant status: Secondary | ICD-10-CM | POA: Insufficient documentation

## 2024-02-11 DIAGNOSIS — T8610 Unspecified complication of kidney transplant: Secondary | ICD-10-CM | POA: Insufficient documentation

## 2024-02-11 LAB — CBC WITH DIFFERENTIAL/PLATELET
Abs Immature Granulocytes: 0.03 K/uL (ref 0.00–0.07)
Basophils Absolute: 0 K/uL (ref 0.0–0.1)
Basophils Relative: 1 %
Eosinophils Absolute: 0 K/uL (ref 0.0–0.5)
Eosinophils Relative: 0 %
HCT: 32.5 % — ABNORMAL LOW (ref 39.0–52.0)
Hemoglobin: 10.6 g/dL — ABNORMAL LOW (ref 13.0–17.0)
Immature Granulocytes: 0 %
Lymphocytes Relative: 2 %
Lymphs Abs: 0.2 K/uL — ABNORMAL LOW (ref 0.7–4.0)
MCH: 28.4 pg (ref 26.0–34.0)
MCHC: 32.6 g/dL (ref 30.0–36.0)
MCV: 87.1 fL (ref 80.0–100.0)
Monocytes Absolute: 0.5 K/uL (ref 0.1–1.0)
Monocytes Relative: 8 %
Neutro Abs: 5.9 K/uL (ref 1.7–7.7)
Neutrophils Relative %: 89 %
Platelets: 194 K/uL (ref 150–400)
RBC: 3.73 MIL/uL — ABNORMAL LOW (ref 4.22–5.81)
RDW: 16.3 % — ABNORMAL HIGH (ref 11.5–15.5)
WBC: 6.7 K/uL (ref 4.0–10.5)
nRBC: 0 % (ref 0.0–0.2)

## 2024-02-11 LAB — BASIC METABOLIC PANEL WITH GFR
Anion gap: 11 (ref 5–15)
BUN: 34 mg/dL — ABNORMAL HIGH (ref 6–20)
CO2: 22 mmol/L (ref 22–32)
Calcium: 9.7 mg/dL (ref 8.9–10.3)
Chloride: 102 mmol/L (ref 98–111)
Creatinine, Ser: 1.2 mg/dL (ref 0.61–1.24)
GFR, Estimated: >60 mL/min (ref >60)
Glucose, Bld: 148 mg/dL — ABNORMAL HIGH (ref 70–99)
Potassium: 5.3 mmol/L — ABNORMAL HIGH (ref 3.5–5.1)
Sodium: 135 mmol/L (ref 135–145)

## 2024-02-11 LAB — PHOSPHORUS: Phosphorus: 1.5 mg/dL — ABNORMAL LOW (ref 2.5–4.6)

## 2024-02-11 LAB — TACROLIMUS LEVEL: Tacrolimus (FK506) - LabCorp: 6.9 ng/mL (ref 5.0–20.0)

## 2024-02-11 LAB — MAGNESIUM: Magnesium: 1.8 mg/dL (ref 1.7–2.4)

## 2024-02-13 LAB — TACROLIMUS LEVEL: Tacrolimus (FK506) - LabCorp: 5.5 ng/mL (ref 5.0–20.0)

## 2024-02-23 ENCOUNTER — Other Ambulatory Visit
Admission: RE | Admit: 2024-02-23 | Discharge: 2024-02-23 | Disposition: A | Discharge status: Home / Self Care | Attending: Nephrology | Admitting: Nephrology

## 2024-02-23 DIAGNOSIS — Z09 Encounter for follow-up examination after completed treatment for conditions other than malignant neoplasm: Secondary | ICD-10-CM | POA: Diagnosis not present

## 2024-02-23 DIAGNOSIS — D899 Disorder involving the immune mechanism, unspecified: Secondary | ICD-10-CM | POA: Insufficient documentation

## 2024-02-23 DIAGNOSIS — N189 Chronic kidney disease, unspecified: Secondary | ICD-10-CM | POA: Insufficient documentation

## 2024-02-23 DIAGNOSIS — E559 Vitamin D deficiency, unspecified: Secondary | ICD-10-CM | POA: Insufficient documentation

## 2024-02-23 DIAGNOSIS — Z9483 Pancreas transplant status: Secondary | ICD-10-CM | POA: Diagnosis not present

## 2024-02-23 DIAGNOSIS — E1122 Type 2 diabetes mellitus with diabetic chronic kidney disease: Secondary | ICD-10-CM | POA: Diagnosis not present

## 2024-02-23 DIAGNOSIS — Z940 Kidney transplant status: Secondary | ICD-10-CM | POA: Insufficient documentation

## 2024-02-23 DIAGNOSIS — Z79899 Other long term (current) drug therapy: Secondary | ICD-10-CM | POA: Diagnosis not present

## 2024-02-23 DIAGNOSIS — Z789 Other specified health status: Secondary | ICD-10-CM | POA: Diagnosis not present

## 2024-02-23 DIAGNOSIS — D631 Anemia in chronic kidney disease: Secondary | ICD-10-CM | POA: Insufficient documentation

## 2024-02-23 DIAGNOSIS — B259 Cytomegaloviral disease, unspecified: Secondary | ICD-10-CM | POA: Insufficient documentation

## 2024-02-23 LAB — CBC WITH DIFFERENTIAL/PLATELET
Abs Immature Granulocytes: 0.31 K/uL — ABNORMAL HIGH (ref 0.00–0.07)
Basophils Absolute: 0 K/uL (ref 0.0–0.1)
Basophils Relative: 1 %
Eosinophils Absolute: 0.1 K/uL (ref 0.0–0.5)
Eosinophils Relative: 1 %
HCT: 30.7 % — ABNORMAL LOW (ref 39.0–52.0)
Hemoglobin: 9.9 g/dL — ABNORMAL LOW (ref 13.0–17.0)
Immature Granulocytes: 4 %
Lymphocytes Relative: 4 %
Lymphs Abs: 0.4 K/uL — ABNORMAL LOW (ref 0.7–4.0)
MCH: 28.5 pg (ref 26.0–34.0)
MCHC: 32.2 g/dL (ref 30.0–36.0)
MCV: 88.5 fL (ref 80.0–100.0)
Monocytes Absolute: 0.7 K/uL (ref 0.1–1.0)
Monocytes Relative: 9 %
Neutro Abs: 6.5 K/uL (ref 1.7–7.7)
Neutrophils Relative %: 81 %
Platelets: 317 K/uL (ref 150–400)
RBC: 3.47 MIL/uL — ABNORMAL LOW (ref 4.22–5.81)
RDW: 18.1 % — ABNORMAL HIGH (ref 11.5–15.5)
WBC: 8 K/uL (ref 4.0–10.5)
nRBC: 0 % (ref 0.0–0.2)

## 2024-02-23 LAB — BASIC METABOLIC PANEL WITH GFR
Anion gap: 7 (ref 5–15)
BUN: 49 mg/dL — ABNORMAL HIGH (ref 6–20)
CO2: 22 mmol/L (ref 22–32)
Calcium: 10 mg/dL (ref 8.9–10.3)
Chloride: 110 mmol/L (ref 98–111)
Creatinine, Ser: 1.31 mg/dL — ABNORMAL HIGH (ref 0.61–1.24)
GFR, Estimated: >60 mL/min (ref >60)
Glucose, Bld: 190 mg/dL — ABNORMAL HIGH (ref 70–99)
Potassium: 5.7 mmol/L — ABNORMAL HIGH (ref 3.5–5.1)
Sodium: 139 mmol/L (ref 135–145)

## 2024-02-23 LAB — MAGNESIUM: Magnesium: 1.9 mg/dL (ref 1.7–2.4)

## 2024-02-23 LAB — PHOSPHORUS: Phosphorus: 2.2 mg/dL — ABNORMAL LOW (ref 2.5–4.6)

## 2024-02-24 LAB — TACROLIMUS LEVEL: Tacrolimus (FK506) - LabCorp: 6.6 ng/mL (ref 5.0–20.0)

## 2024-02-26 ENCOUNTER — Other Ambulatory Visit
Admission: RE | Admit: 2024-02-26 | Discharge: 2024-02-26 | Disposition: A | Discharge status: Home / Self Care | Attending: Nephrology | Admitting: Nephrology

## 2024-02-26 DIAGNOSIS — D631 Anemia in chronic kidney disease: Secondary | ICD-10-CM | POA: Insufficient documentation

## 2024-02-26 DIAGNOSIS — Z940 Kidney transplant status: Secondary | ICD-10-CM | POA: Diagnosis present

## 2024-02-26 DIAGNOSIS — Z114 Encounter for screening for human immunodeficiency virus [HIV]: Secondary | ICD-10-CM | POA: Insufficient documentation

## 2024-02-26 DIAGNOSIS — Z9483 Pancreas transplant status: Secondary | ICD-10-CM | POA: Diagnosis not present

## 2024-02-26 DIAGNOSIS — T8610 Unspecified complication of kidney transplant: Secondary | ICD-10-CM | POA: Diagnosis not present

## 2024-02-26 DIAGNOSIS — E1129 Type 2 diabetes mellitus with other diabetic kidney complication: Secondary | ICD-10-CM | POA: Diagnosis not present

## 2024-02-26 DIAGNOSIS — Z789 Other specified health status: Secondary | ICD-10-CM | POA: Diagnosis not present

## 2024-02-26 DIAGNOSIS — B259 Cytomegaloviral disease, unspecified: Secondary | ICD-10-CM | POA: Insufficient documentation

## 2024-02-26 DIAGNOSIS — Z09 Encounter for follow-up examination after completed treatment for conditions other than malignant neoplasm: Secondary | ICD-10-CM | POA: Insufficient documentation

## 2024-02-26 DIAGNOSIS — Z79899 Other long term (current) drug therapy: Secondary | ICD-10-CM | POA: Diagnosis not present

## 2024-02-26 DIAGNOSIS — N390 Urinary tract infection, site not specified: Secondary | ICD-10-CM | POA: Insufficient documentation

## 2024-02-26 DIAGNOSIS — D899 Disorder involving the immune mechanism, unspecified: Secondary | ICD-10-CM | POA: Diagnosis present

## 2024-02-26 DIAGNOSIS — E559 Vitamin D deficiency, unspecified: Secondary | ICD-10-CM | POA: Diagnosis present

## 2024-02-26 LAB — CBC WITH DIFFERENTIAL/PLATELET
Abs Immature Granulocytes: 0.09 K/uL — ABNORMAL HIGH (ref 0.00–0.07)
Basophils Absolute: 0 K/uL (ref 0.0–0.1)
Basophils Relative: 1 %
Eosinophils Absolute: 0.1 K/uL (ref 0.0–0.5)
Eosinophils Relative: 2 %
HCT: 35.6 % — ABNORMAL LOW (ref 39.0–52.0)
Hemoglobin: 11.6 g/dL — ABNORMAL LOW (ref 13.0–17.0)
Immature Granulocytes: 1 %
Lymphocytes Relative: 7 %
Lymphs Abs: 0.5 K/uL — ABNORMAL LOW (ref 0.7–4.0)
MCH: 29.1 pg (ref 26.0–34.0)
MCHC: 32.6 g/dL (ref 30.0–36.0)
MCV: 89.4 fL (ref 80.0–100.0)
Monocytes Absolute: 0.5 K/uL (ref 0.1–1.0)
Monocytes Relative: 7 %
Neutro Abs: 5.6 K/uL (ref 1.7–7.7)
Neutrophils Relative %: 82 %
Platelets: 291 K/uL (ref 150–400)
RBC: 3.98 MIL/uL — ABNORMAL LOW (ref 4.22–5.81)
RDW: 18.1 % — ABNORMAL HIGH (ref 11.5–15.5)
WBC: 6.7 K/uL (ref 4.0–10.5)
nRBC: 0 % (ref 0.0–0.2)

## 2024-02-26 LAB — BASIC METABOLIC PANEL WITH GFR
Anion gap: 8 (ref 5–15)
BUN: 50 mg/dL — ABNORMAL HIGH (ref 6–20)
CO2: 23 mmol/L (ref 22–32)
Calcium: 10.2 mg/dL (ref 8.9–10.3)
Chloride: 109 mmol/L (ref 98–111)
Creatinine, Ser: 1.69 mg/dL — ABNORMAL HIGH (ref 0.61–1.24)
GFR, Estimated: 55 mL/min — ABNORMAL LOW (ref >60)
Glucose, Bld: 200 mg/dL — ABNORMAL HIGH (ref 70–99)
Potassium: 5.1 mmol/L (ref 3.5–5.1)
Sodium: 140 mmol/L (ref 135–145)

## 2024-02-26 LAB — MAGNESIUM: Magnesium: 2.3 mg/dL (ref 1.7–2.4)

## 2024-02-26 LAB — PHOSPHORUS: Phosphorus: 2.1 mg/dL — ABNORMAL LOW (ref 2.5–4.6)

## 2024-02-28 LAB — TACROLIMUS LEVEL: Tacrolimus (FK506) - LabCorp: 7.7 ng/mL (ref 5.0–20.0)

## 2024-03-01 ENCOUNTER — Other Ambulatory Visit
Admission: RE | Admit: 2024-03-01 | Discharge: 2024-03-01 | Disposition: A | Discharge status: Home / Self Care | Attending: Nephrology | Admitting: Nephrology

## 2024-03-01 DIAGNOSIS — Z789 Other specified health status: Secondary | ICD-10-CM | POA: Insufficient documentation

## 2024-03-01 DIAGNOSIS — Z9483 Pancreas transplant status: Secondary | ICD-10-CM | POA: Diagnosis not present

## 2024-03-01 DIAGNOSIS — D631 Anemia in chronic kidney disease: Secondary | ICD-10-CM | POA: Diagnosis not present

## 2024-03-01 DIAGNOSIS — E559 Vitamin D deficiency, unspecified: Secondary | ICD-10-CM | POA: Diagnosis present

## 2024-03-01 DIAGNOSIS — Z09 Encounter for follow-up examination after completed treatment for conditions other than malignant neoplasm: Secondary | ICD-10-CM | POA: Insufficient documentation

## 2024-03-01 DIAGNOSIS — Z114 Encounter for screening for human immunodeficiency virus [HIV]: Secondary | ICD-10-CM | POA: Diagnosis not present

## 2024-03-01 DIAGNOSIS — Z79899 Other long term (current) drug therapy: Secondary | ICD-10-CM | POA: Diagnosis not present

## 2024-03-01 DIAGNOSIS — E1129 Type 2 diabetes mellitus with other diabetic kidney complication: Secondary | ICD-10-CM | POA: Diagnosis not present

## 2024-03-01 DIAGNOSIS — B259 Cytomegaloviral disease, unspecified: Secondary | ICD-10-CM | POA: Diagnosis not present

## 2024-03-01 DIAGNOSIS — D899 Disorder involving the immune mechanism, unspecified: Secondary | ICD-10-CM | POA: Insufficient documentation

## 2024-03-01 DIAGNOSIS — T8610 Unspecified complication of kidney transplant: Secondary | ICD-10-CM | POA: Diagnosis not present

## 2024-03-01 DIAGNOSIS — Z940 Kidney transplant status: Secondary | ICD-10-CM | POA: Diagnosis present

## 2024-03-01 DIAGNOSIS — N390 Urinary tract infection, site not specified: Secondary | ICD-10-CM | POA: Diagnosis not present

## 2024-03-01 LAB — CBC WITH DIFFERENTIAL/PLATELET
Abs Immature Granulocytes: 0.07 K/uL (ref 0.00–0.07)
Basophils Absolute: 0 K/uL (ref 0.0–0.1)
Basophils Relative: 1 %
Eosinophils Absolute: 0.1 K/uL (ref 0.0–0.5)
Eosinophils Relative: 2 %
HCT: 33.3 % — ABNORMAL LOW (ref 39.0–52.0)
Hemoglobin: 10.3 g/dL — ABNORMAL LOW (ref 13.0–17.0)
Immature Granulocytes: 1 %
Lymphocytes Relative: 9 %
Lymphs Abs: 0.4 K/uL — ABNORMAL LOW (ref 0.7–4.0)
MCH: 28.4 pg (ref 26.0–34.0)
MCHC: 30.9 g/dL (ref 30.0–36.0)
MCV: 91.7 fL (ref 80.0–100.0)
Monocytes Absolute: 0.3 K/uL (ref 0.1–1.0)
Monocytes Relative: 6 %
Neutro Abs: 4 K/uL (ref 1.7–7.7)
Neutrophils Relative %: 81 %
Platelets: 188 K/uL (ref 150–400)
RBC: 3.63 MIL/uL — ABNORMAL LOW (ref 4.22–5.81)
RDW: 18 % — ABNORMAL HIGH (ref 11.5–15.5)
WBC: 5 K/uL (ref 4.0–10.5)
nRBC: 0 % (ref 0.0–0.2)

## 2024-03-01 LAB — BASIC METABOLIC PANEL WITH GFR
Anion gap: 7 (ref 5–15)
BUN: 42 mg/dL — ABNORMAL HIGH (ref 6–20)
CO2: 22 mmol/L (ref 22–32)
Calcium: 9.7 mg/dL (ref 8.9–10.3)
Chloride: 110 mmol/L (ref 98–111)
Creatinine, Ser: 1.28 mg/dL — ABNORMAL HIGH (ref 0.61–1.24)
GFR, Estimated: >60 mL/min (ref >60)
Glucose, Bld: 161 mg/dL — ABNORMAL HIGH (ref 70–99)
Potassium: 4.9 mmol/L (ref 3.5–5.1)
Sodium: 139 mmol/L (ref 135–145)

## 2024-03-01 LAB — PHOSPHORUS: Phosphorus: 2 mg/dL — ABNORMAL LOW (ref 2.5–4.6)

## 2024-03-01 LAB — MAGNESIUM: Magnesium: 2.1 mg/dL (ref 1.7–2.4)

## 2024-03-03 LAB — TACROLIMUS LEVEL: Tacrolimus (FK506) - LabCorp: 8.2 ng/mL (ref 5.0–20.0)

## 2024-03-04 ENCOUNTER — Other Ambulatory Visit
Admission: RE | Admit: 2024-03-04 | Discharge: 2024-03-04 | Disposition: A | Discharge status: Home / Self Care | Attending: Nephrology | Admitting: Nephrology

## 2024-03-04 DIAGNOSIS — N390 Urinary tract infection, site not specified: Secondary | ICD-10-CM | POA: Insufficient documentation

## 2024-03-04 DIAGNOSIS — Z9483 Pancreas transplant status: Secondary | ICD-10-CM | POA: Insufficient documentation

## 2024-03-04 DIAGNOSIS — D631 Anemia in chronic kidney disease: Secondary | ICD-10-CM | POA: Diagnosis not present

## 2024-03-04 DIAGNOSIS — E559 Vitamin D deficiency, unspecified: Secondary | ICD-10-CM | POA: Diagnosis not present

## 2024-03-04 DIAGNOSIS — Z114 Encounter for screening for human immunodeficiency virus [HIV]: Secondary | ICD-10-CM | POA: Diagnosis not present

## 2024-03-04 DIAGNOSIS — Z79899 Other long term (current) drug therapy: Secondary | ICD-10-CM | POA: Insufficient documentation

## 2024-03-04 DIAGNOSIS — Z940 Kidney transplant status: Secondary | ICD-10-CM | POA: Diagnosis present

## 2024-03-04 DIAGNOSIS — E1129 Type 2 diabetes mellitus with other diabetic kidney complication: Secondary | ICD-10-CM | POA: Diagnosis not present

## 2024-03-04 DIAGNOSIS — Z789 Other specified health status: Secondary | ICD-10-CM | POA: Insufficient documentation

## 2024-03-04 DIAGNOSIS — B259 Cytomegaloviral disease, unspecified: Secondary | ICD-10-CM | POA: Insufficient documentation

## 2024-03-04 DIAGNOSIS — Z09 Encounter for follow-up examination after completed treatment for conditions other than malignant neoplasm: Secondary | ICD-10-CM | POA: Diagnosis not present

## 2024-03-04 DIAGNOSIS — D899 Disorder involving the immune mechanism, unspecified: Secondary | ICD-10-CM | POA: Insufficient documentation

## 2024-03-04 LAB — CBC WITH DIFFERENTIAL/PLATELET
Abs Immature Granulocytes: 0.07 K/uL (ref 0.00–0.07)
Basophils Absolute: 0 K/uL (ref 0.0–0.1)
Basophils Relative: 1 %
Eosinophils Absolute: 0.1 K/uL (ref 0.0–0.5)
Eosinophils Relative: 2 %
HCT: 37.7 % — ABNORMAL LOW (ref 39.0–52.0)
Hemoglobin: 12.3 g/dL — ABNORMAL LOW (ref 13.0–17.0)
Immature Granulocytes: 2 %
Lymphocytes Relative: 11 %
Lymphs Abs: 0.5 K/uL — ABNORMAL LOW (ref 0.7–4.0)
MCH: 29.5 pg (ref 26.0–34.0)
MCHC: 32.6 g/dL (ref 30.0–36.0)
MCV: 90.4 fL (ref 80.0–100.0)
Monocytes Absolute: 0.2 K/uL (ref 0.1–1.0)
Monocytes Relative: 5 %
Neutro Abs: 3.4 K/uL (ref 1.7–7.7)
Neutrophils Relative %: 79 %
Platelets: 185 K/uL (ref 150–400)
RBC: 4.17 MIL/uL — ABNORMAL LOW (ref 4.22–5.81)
RDW: 18.6 % — ABNORMAL HIGH (ref 11.5–15.5)
WBC: 4.3 K/uL (ref 4.0–10.5)
nRBC: 0 % (ref 0.0–0.2)

## 2024-03-04 LAB — BASIC METABOLIC PANEL WITH GFR
Anion gap: 10 (ref 5–15)
BUN: 43 mg/dL — ABNORMAL HIGH (ref 6–20)
CO2: 22 mmol/L (ref 22–32)
Calcium: 10.2 mg/dL (ref 8.9–10.3)
Chloride: 108 mmol/L (ref 98–111)
Creatinine, Ser: 1.1 mg/dL (ref 0.61–1.24)
GFR, Estimated: >60 mL/min (ref >60)
Glucose, Bld: 174 mg/dL — ABNORMAL HIGH (ref 70–99)
Potassium: 4.7 mmol/L (ref 3.5–5.1)
Sodium: 140 mmol/L (ref 135–145)

## 2024-03-04 LAB — PHOSPHORUS: Phosphorus: 2.2 mg/dL — ABNORMAL LOW (ref 2.5–4.6)

## 2024-03-04 LAB — MAGNESIUM: Magnesium: 2 mg/dL (ref 1.7–2.4)

## 2024-03-06 LAB — TACROLIMUS LEVEL: Tacrolimus (FK506) - LabCorp: 9.6 ng/mL (ref 5.0–20.0)

## 2024-03-10 ENCOUNTER — Other Ambulatory Visit
Admission: RE | Admit: 2024-03-10 | Discharge: 2024-03-10 | Disposition: A | Discharge status: Home / Self Care | Attending: Nephrology | Admitting: Nephrology

## 2024-03-10 DIAGNOSIS — Z114 Encounter for screening for human immunodeficiency virus [HIV]: Secondary | ICD-10-CM | POA: Diagnosis not present

## 2024-03-10 DIAGNOSIS — Z79899 Other long term (current) drug therapy: Secondary | ICD-10-CM | POA: Diagnosis not present

## 2024-03-10 DIAGNOSIS — D631 Anemia in chronic kidney disease: Secondary | ICD-10-CM | POA: Diagnosis not present

## 2024-03-10 DIAGNOSIS — B259 Cytomegaloviral disease, unspecified: Secondary | ICD-10-CM | POA: Insufficient documentation

## 2024-03-10 DIAGNOSIS — Z09 Encounter for follow-up examination after completed treatment for conditions other than malignant neoplasm: Secondary | ICD-10-CM | POA: Insufficient documentation

## 2024-03-10 DIAGNOSIS — Z789 Other specified health status: Secondary | ICD-10-CM | POA: Insufficient documentation

## 2024-03-10 DIAGNOSIS — N390 Urinary tract infection, site not specified: Secondary | ICD-10-CM | POA: Diagnosis not present

## 2024-03-10 DIAGNOSIS — E559 Vitamin D deficiency, unspecified: Secondary | ICD-10-CM | POA: Diagnosis not present

## 2024-03-10 DIAGNOSIS — Z9483 Pancreas transplant status: Secondary | ICD-10-CM | POA: Insufficient documentation

## 2024-03-10 DIAGNOSIS — N189 Chronic kidney disease, unspecified: Secondary | ICD-10-CM | POA: Insufficient documentation

## 2024-03-10 DIAGNOSIS — D899 Disorder involving the immune mechanism, unspecified: Secondary | ICD-10-CM | POA: Diagnosis not present

## 2024-03-10 DIAGNOSIS — E1129 Type 2 diabetes mellitus with other diabetic kidney complication: Secondary | ICD-10-CM | POA: Insufficient documentation

## 2024-03-10 DIAGNOSIS — Z940 Kidney transplant status: Secondary | ICD-10-CM | POA: Diagnosis not present

## 2024-03-11 LAB — HIV-1 RNA QUANT-NO REFLEX-BLD
HIV 1 RNA Quant: <20 {copies}/mL
LOG10 HIV-1 RNA: UNDETERMINED {Log_copies}/mL

## 2024-03-12 LAB — HCV RNA QUANT: HCV Quantitative: NOT DETECTED [IU]/mL (ref >50)

## 2024-03-12 LAB — HEPATITIS B DNA, ULTRAQUANTITATIVE, PCR
HBV DNA SERPL PCR-ACNC: NOT DETECTED [IU]/mL
HBV DNA SERPL PCR-LOG IU: UNDETERMINED {Log_IU}/mL

## 2024-03-17 ENCOUNTER — Other Ambulatory Visit
Admission: RE | Admit: 2024-03-17 | Discharge: 2024-03-17 | Disposition: A | Discharge status: Home / Self Care | Attending: Nephrology | Admitting: Nephrology

## 2024-03-17 DIAGNOSIS — Z09 Encounter for follow-up examination after completed treatment for conditions other than malignant neoplasm: Secondary | ICD-10-CM | POA: Insufficient documentation

## 2024-03-17 DIAGNOSIS — Z940 Kidney transplant status: Secondary | ICD-10-CM | POA: Diagnosis present

## 2024-03-17 DIAGNOSIS — Z9483 Pancreas transplant status: Secondary | ICD-10-CM | POA: Diagnosis not present

## 2024-03-17 DIAGNOSIS — Z789 Other specified health status: Secondary | ICD-10-CM | POA: Insufficient documentation

## 2024-03-17 DIAGNOSIS — Z114 Encounter for screening for human immunodeficiency virus [HIV]: Secondary | ICD-10-CM | POA: Diagnosis not present

## 2024-03-17 DIAGNOSIS — D631 Anemia in chronic kidney disease: Secondary | ICD-10-CM | POA: Insufficient documentation

## 2024-03-17 DIAGNOSIS — D899 Disorder involving the immune mechanism, unspecified: Secondary | ICD-10-CM | POA: Diagnosis present

## 2024-03-17 DIAGNOSIS — E559 Vitamin D deficiency, unspecified: Secondary | ICD-10-CM | POA: Diagnosis present

## 2024-03-17 DIAGNOSIS — E1129 Type 2 diabetes mellitus with other diabetic kidney complication: Secondary | ICD-10-CM | POA: Diagnosis not present

## 2024-03-17 DIAGNOSIS — N390 Urinary tract infection, site not specified: Secondary | ICD-10-CM | POA: Insufficient documentation

## 2024-03-17 DIAGNOSIS — B259 Cytomegaloviral disease, unspecified: Secondary | ICD-10-CM | POA: Diagnosis not present

## 2024-03-17 DIAGNOSIS — T8610 Unspecified complication of kidney transplant: Secondary | ICD-10-CM | POA: Diagnosis not present

## 2024-03-17 DIAGNOSIS — Z79899 Other long term (current) drug therapy: Secondary | ICD-10-CM | POA: Insufficient documentation

## 2024-03-17 LAB — BASIC METABOLIC PANEL WITH GFR
Anion gap: 9 (ref 5–15)
BUN: 50 mg/dL — ABNORMAL HIGH (ref 6–20)
CO2: 23 mmol/L (ref 22–32)
Calcium: 9.8 mg/dL (ref 8.9–10.3)
Chloride: 106 mmol/L (ref 98–111)
Creatinine, Ser: 1.97 mg/dL — ABNORMAL HIGH (ref 0.61–1.24)
GFR, Estimated: 45 mL/min — ABNORMAL LOW (ref >60)
Glucose, Bld: 215 mg/dL — ABNORMAL HIGH (ref 70–99)
Potassium: 4.9 mmol/L (ref 3.5–5.1)
Sodium: 138 mmol/L (ref 135–145)

## 2024-03-17 LAB — CBC WITH DIFFERENTIAL/PLATELET
Abs Immature Granulocytes: 0.5 K/uL — ABNORMAL HIGH (ref 0.00–0.07)
Band Neutrophils: 1 %
Basophils Absolute: 0 K/uL (ref 0.0–0.1)
Basophils Relative: 0 %
Eosinophils Absolute: 0.1 K/uL (ref 0.0–0.5)
Eosinophils Relative: 1 %
HCT: 34.2 % — ABNORMAL LOW (ref 39.0–52.0)
Hemoglobin: 11 g/dL — ABNORMAL LOW (ref 13.0–17.0)
Lymphocytes Relative: 8 %
Lymphs Abs: 0.4 K/uL — ABNORMAL LOW (ref 0.7–4.0)
MCH: 29.4 pg (ref 26.0–34.0)
MCHC: 32.2 g/dL (ref 30.0–36.0)
MCV: 91.4 fL (ref 80.0–100.0)
Metamyelocytes Relative: 3 %
Monocytes Absolute: 0.5 K/uL (ref 0.1–1.0)
Monocytes Relative: 9 %
Myelocytes: 6 %
Neutro Abs: 3.7 K/uL (ref 1.7–7.7)
Neutrophils Relative %: 72 %
Platelets: 370 K/uL (ref 150–400)
RBC: 3.74 MIL/uL — ABNORMAL LOW (ref 4.22–5.81)
RDW: 17.1 % — ABNORMAL HIGH (ref 11.5–15.5)
WBC: 5.1 K/uL (ref 4.0–10.5)
nRBC: 0 % (ref 0.0–0.2)

## 2024-03-17 LAB — PHOSPHORUS: Phosphorus: 2.7 mg/dL (ref 2.5–4.6)

## 2024-03-17 LAB — MAGNESIUM: Magnesium: 1.8 mg/dL (ref 1.7–2.4)

## 2024-03-18 LAB — TACROLIMUS LEVEL: Tacrolimus (FK506) - LabCorp: 11.6 ng/mL (ref 5.0–20.0)

## 2024-03-19 ENCOUNTER — Other Ambulatory Visit
Admission: RE | Admit: 2024-03-19 | Discharge: 2024-03-19 | Disposition: A | Discharge status: Home / Self Care | Attending: Nephrology | Admitting: Nephrology

## 2024-03-19 DIAGNOSIS — B259 Cytomegaloviral disease, unspecified: Secondary | ICD-10-CM | POA: Diagnosis not present

## 2024-03-19 DIAGNOSIS — Z9483 Pancreas transplant status: Secondary | ICD-10-CM | POA: Diagnosis not present

## 2024-03-19 DIAGNOSIS — Z79899 Other long term (current) drug therapy: Secondary | ICD-10-CM | POA: Insufficient documentation

## 2024-03-19 DIAGNOSIS — D899 Disorder involving the immune mechanism, unspecified: Secondary | ICD-10-CM | POA: Diagnosis not present

## 2024-03-19 DIAGNOSIS — D631 Anemia in chronic kidney disease: Secondary | ICD-10-CM | POA: Diagnosis not present

## 2024-03-19 DIAGNOSIS — Z940 Kidney transplant status: Secondary | ICD-10-CM | POA: Diagnosis present

## 2024-03-19 DIAGNOSIS — Z09 Encounter for follow-up examination after completed treatment for conditions other than malignant neoplasm: Secondary | ICD-10-CM | POA: Insufficient documentation

## 2024-03-19 DIAGNOSIS — Z789 Other specified health status: Secondary | ICD-10-CM | POA: Insufficient documentation

## 2024-03-19 DIAGNOSIS — E559 Vitamin D deficiency, unspecified: Secondary | ICD-10-CM | POA: Diagnosis not present

## 2024-03-19 DIAGNOSIS — N390 Urinary tract infection, site not specified: Secondary | ICD-10-CM | POA: Diagnosis not present

## 2024-03-19 DIAGNOSIS — E1129 Type 2 diabetes mellitus with other diabetic kidney complication: Secondary | ICD-10-CM | POA: Insufficient documentation

## 2024-03-19 LAB — CBC WITH DIFFERENTIAL/PLATELET
Abs Immature Granulocytes: 0.88 K/uL — ABNORMAL HIGH (ref 0.00–0.07)
Basophils Absolute: 0 K/uL (ref 0.0–0.1)
Basophils Relative: 1 %
Eosinophils Absolute: 0 K/uL (ref 0.0–0.5)
Eosinophils Relative: 1 %
HCT: 33.6 % — ABNORMAL LOW (ref 39.0–52.0)
Hemoglobin: 10.8 g/dL — ABNORMAL LOW (ref 13.0–17.0)
Immature Granulocytes: 21 %
Lymphocytes Relative: 7 %
Lymphs Abs: 0.3 K/uL — ABNORMAL LOW (ref 0.7–4.0)
MCH: 29.1 pg (ref 26.0–34.0)
MCHC: 32.1 g/dL (ref 30.0–36.0)
MCV: 90.6 fL (ref 80.0–100.0)
Monocytes Absolute: 0.3 K/uL (ref 0.1–1.0)
Monocytes Relative: 7 %
Neutro Abs: 2.8 K/uL (ref 1.7–7.7)
Neutrophils Relative %: 63 %
Platelets: 342 K/uL (ref 150–400)
RBC: 3.71 MIL/uL — ABNORMAL LOW (ref 4.22–5.81)
RDW: 16.5 % — ABNORMAL HIGH (ref 11.5–15.5)
Smear Review: NORMAL
WBC: 4.3 K/uL (ref 4.0–10.5)
nRBC: 0 % (ref 0.0–0.2)

## 2024-03-19 LAB — BASIC METABOLIC PANEL WITH GFR
Anion gap: 9 (ref 5–15)
BUN: 31 mg/dL — ABNORMAL HIGH (ref 6–20)
CO2: 22 mmol/L (ref 22–32)
Calcium: 9.9 mg/dL (ref 8.9–10.3)
Chloride: 104 mmol/L (ref 98–111)
Creatinine, Ser: 1.83 mg/dL — ABNORMAL HIGH (ref 0.61–1.24)
GFR, Estimated: 50 mL/min — ABNORMAL LOW (ref >60)
Glucose, Bld: 206 mg/dL — ABNORMAL HIGH (ref 70–99)
Potassium: 4.6 mmol/L (ref 3.5–5.1)
Sodium: 135 mmol/L (ref 135–145)

## 2024-03-19 LAB — PHOSPHORUS: Phosphorus: 2 mg/dL — ABNORMAL LOW (ref 2.5–4.6)

## 2024-03-19 LAB — MAGNESIUM: Magnesium: 1.9 mg/dL (ref 1.7–2.4)

## 2024-03-21 LAB — TACROLIMUS LEVEL: Tacrolimus (FK506) - LabCorp: 8.9 ng/mL (ref 5.0–20.0)

## 2024-03-23 ENCOUNTER — Other Ambulatory Visit
Admission: RE | Admit: 2024-03-23 | Discharge: 2024-03-23 | Disposition: A | Discharge status: Home / Self Care | Attending: Nephrology | Admitting: Nephrology

## 2024-03-23 DIAGNOSIS — Z789 Other specified health status: Secondary | ICD-10-CM | POA: Insufficient documentation

## 2024-03-23 DIAGNOSIS — N189 Chronic kidney disease, unspecified: Secondary | ICD-10-CM | POA: Insufficient documentation

## 2024-03-23 DIAGNOSIS — B259 Cytomegaloviral disease, unspecified: Secondary | ICD-10-CM | POA: Insufficient documentation

## 2024-03-23 DIAGNOSIS — D631 Anemia in chronic kidney disease: Secondary | ICD-10-CM | POA: Diagnosis not present

## 2024-03-23 DIAGNOSIS — E559 Vitamin D deficiency, unspecified: Secondary | ICD-10-CM | POA: Insufficient documentation

## 2024-03-23 DIAGNOSIS — Z79899 Other long term (current) drug therapy: Secondary | ICD-10-CM | POA: Insufficient documentation

## 2024-03-23 DIAGNOSIS — Z114 Encounter for screening for human immunodeficiency virus [HIV]: Secondary | ICD-10-CM | POA: Diagnosis not present

## 2024-03-23 DIAGNOSIS — N390 Urinary tract infection, site not specified: Secondary | ICD-10-CM | POA: Insufficient documentation

## 2024-03-23 DIAGNOSIS — E1129 Type 2 diabetes mellitus with other diabetic kidney complication: Secondary | ICD-10-CM | POA: Diagnosis not present

## 2024-03-23 DIAGNOSIS — Z940 Kidney transplant status: Secondary | ICD-10-CM | POA: Diagnosis not present

## 2024-03-23 DIAGNOSIS — Z09 Encounter for follow-up examination after completed treatment for conditions other than malignant neoplasm: Secondary | ICD-10-CM | POA: Insufficient documentation

## 2024-03-23 DIAGNOSIS — D899 Disorder involving the immune mechanism, unspecified: Secondary | ICD-10-CM | POA: Insufficient documentation

## 2024-03-23 DIAGNOSIS — Z9483 Pancreas transplant status: Secondary | ICD-10-CM | POA: Insufficient documentation

## 2024-03-23 LAB — BASIC METABOLIC PANEL WITH GFR
Anion gap: 8 (ref 5–15)
BUN: 35 mg/dL — ABNORMAL HIGH (ref 6–20)
CO2: 23 mmol/L (ref 22–32)
Calcium: 9.5 mg/dL (ref 8.9–10.3)
Chloride: 107 mmol/L (ref 98–111)
Creatinine, Ser: 1.64 mg/dL — ABNORMAL HIGH (ref 0.61–1.24)
GFR, Estimated: 57 mL/min — ABNORMAL LOW (ref >60)
Glucose, Bld: 293 mg/dL — ABNORMAL HIGH (ref 70–99)
Potassium: 5.3 mmol/L — ABNORMAL HIGH (ref 3.5–5.1)
Sodium: 138 mmol/L (ref 135–145)

## 2024-03-23 LAB — CBC WITH DIFFERENTIAL/PLATELET
Abs Immature Granulocytes: 0.61 K/uL — ABNORMAL HIGH (ref 0.00–0.07)
Basophils Absolute: 0 K/uL (ref 0.0–0.1)
Basophils Relative: 1 %
Eosinophils Absolute: 0.1 K/uL (ref 0.0–0.5)
Eosinophils Relative: 2 %
HCT: 33 % — ABNORMAL LOW (ref 39.0–52.0)
Hemoglobin: 10.7 g/dL — ABNORMAL LOW (ref 13.0–17.0)
Immature Granulocytes: 16 %
Lymphocytes Relative: 9 %
Lymphs Abs: 0.3 K/uL — ABNORMAL LOW (ref 0.7–4.0)
MCH: 29.7 pg (ref 26.0–34.0)
MCHC: 32.4 g/dL (ref 30.0–36.0)
MCV: 91.7 fL (ref 80.0–100.0)
Monocytes Absolute: 0.2 K/uL (ref 0.1–1.0)
Monocytes Relative: 6 %
Neutro Abs: 2.5 K/uL (ref 1.7–7.7)
Neutrophils Relative %: 66 %
Platelets: 337 K/uL (ref 150–400)
RBC: 3.6 MIL/uL — ABNORMAL LOW (ref 4.22–5.81)
RDW: 16.3 % — ABNORMAL HIGH (ref 11.5–15.5)
Smear Review: NORMAL
WBC: 3.8 K/uL — ABNORMAL LOW (ref 4.0–10.5)
nRBC: 0 % (ref 0.0–0.2)

## 2024-03-23 LAB — MAGNESIUM: Magnesium: 1.8 mg/dL (ref 1.7–2.4)

## 2024-03-23 LAB — PHOSPHORUS: Phosphorus: 2.3 mg/dL — ABNORMAL LOW (ref 2.5–4.6)

## 2024-03-24 LAB — TACROLIMUS LEVEL: Tacrolimus (FK506) - LabCorp: 8.5 ng/mL (ref 5.0–20.0)

## 2024-03-31 ENCOUNTER — Other Ambulatory Visit
Admission: RE | Admit: 2024-03-31 | Discharge: 2024-03-31 | Disposition: A | Discharge status: Home / Self Care | Attending: Nephrology | Admitting: Nephrology

## 2024-03-31 DIAGNOSIS — E1129 Type 2 diabetes mellitus with other diabetic kidney complication: Secondary | ICD-10-CM | POA: Insufficient documentation

## 2024-03-31 DIAGNOSIS — N189 Chronic kidney disease, unspecified: Secondary | ICD-10-CM | POA: Diagnosis not present

## 2024-03-31 DIAGNOSIS — Z789 Other specified health status: Secondary | ICD-10-CM | POA: Insufficient documentation

## 2024-03-31 DIAGNOSIS — B259 Cytomegaloviral disease, unspecified: Secondary | ICD-10-CM | POA: Diagnosis not present

## 2024-03-31 DIAGNOSIS — Z9483 Pancreas transplant status: Secondary | ICD-10-CM | POA: Insufficient documentation

## 2024-03-31 DIAGNOSIS — Z940 Kidney transplant status: Secondary | ICD-10-CM | POA: Insufficient documentation

## 2024-03-31 DIAGNOSIS — E559 Vitamin D deficiency, unspecified: Secondary | ICD-10-CM | POA: Diagnosis not present

## 2024-03-31 DIAGNOSIS — Z79899 Other long term (current) drug therapy: Secondary | ICD-10-CM | POA: Diagnosis not present

## 2024-03-31 DIAGNOSIS — N390 Urinary tract infection, site not specified: Secondary | ICD-10-CM | POA: Diagnosis not present

## 2024-03-31 DIAGNOSIS — Z114 Encounter for screening for human immunodeficiency virus [HIV]: Secondary | ICD-10-CM | POA: Insufficient documentation

## 2024-03-31 DIAGNOSIS — D631 Anemia in chronic kidney disease: Secondary | ICD-10-CM | POA: Diagnosis not present

## 2024-03-31 DIAGNOSIS — D899 Disorder involving the immune mechanism, unspecified: Secondary | ICD-10-CM | POA: Diagnosis not present

## 2024-03-31 DIAGNOSIS — Z09 Encounter for follow-up examination after completed treatment for conditions other than malignant neoplasm: Secondary | ICD-10-CM | POA: Diagnosis present

## 2024-03-31 LAB — BASIC METABOLIC PANEL WITH GFR
Anion gap: 11 (ref 5–15)
BUN: 43 mg/dL — ABNORMAL HIGH (ref 6–20)
CO2: 23 mmol/L (ref 22–32)
Calcium: 9.8 mg/dL (ref 8.9–10.3)
Chloride: 104 mmol/L (ref 98–111)
Creatinine, Ser: 2.5 mg/dL — ABNORMAL HIGH (ref 0.61–1.24)
GFR, Estimated: 34 mL/min — ABNORMAL LOW (ref >60)
Glucose, Bld: 227 mg/dL — ABNORMAL HIGH (ref 70–99)
Potassium: 3.9 mmol/L (ref 3.5–5.1)
Sodium: 138 mmol/L (ref 135–145)

## 2024-03-31 LAB — CBC WITH DIFFERENTIAL/PLATELET
Abs Immature Granulocytes: 0.72 K/uL — ABNORMAL HIGH (ref 0.00–0.07)
Basophils Absolute: 0 K/uL (ref 0.0–0.1)
Basophils Relative: 1 %
Eosinophils Absolute: 0 K/uL (ref 0.0–0.5)
Eosinophils Relative: 1 %
HCT: 32.2 % — ABNORMAL LOW (ref 39.0–52.0)
Hemoglobin: 10.7 g/dL — ABNORMAL LOW (ref 13.0–17.0)
Immature Granulocytes: 21 %
Lymphocytes Relative: 8 %
Lymphs Abs: 0.3 K/uL — ABNORMAL LOW (ref 0.7–4.0)
MCH: 29.6 pg (ref 26.0–34.0)
MCHC: 33.2 g/dL (ref 30.0–36.0)
MCV: 89 fL (ref 80.0–100.0)
Monocytes Absolute: 0.3 K/uL (ref 0.1–1.0)
Monocytes Relative: 8 %
Neutro Abs: 2.1 K/uL (ref 1.7–7.7)
Neutrophils Relative %: 61 %
Platelets: 204 K/uL (ref 150–400)
RBC: 3.62 MIL/uL — ABNORMAL LOW (ref 4.22–5.81)
RDW: 15 % (ref 11.5–15.5)
Smear Review: NORMAL
WBC: 3.5 K/uL — ABNORMAL LOW (ref 4.0–10.5)
nRBC: 0 % (ref 0.0–0.2)

## 2024-03-31 LAB — MAGNESIUM: Magnesium: 2 mg/dL (ref 1.7–2.4)

## 2024-03-31 LAB — PHOSPHORUS: Phosphorus: 2 mg/dL — ABNORMAL LOW (ref 2.5–4.6)

## 2024-04-02 LAB — TACROLIMUS LEVEL: Tacrolimus (FK506) - LabCorp: 9.8 ng/mL (ref 5.0–20.0)

## 2024-04-06 NOTE — Progress Notes (Signed)
 1) Proliferative diabetic retinopathy both eyes  DM type 2 Since age 33 (19 years). Last A1C 6.6%. 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 S/p PRP OU in 2019-20 S/p PPV/EL Vincent) in 2020 OD Today exam Quiescent both eyes. No fluid on OCT. IOP okay Observe     2) Myopia Manifest refraction done and prescription given today.    No follow-ups on file.    Extended ophthalmoscopy report (90D) Right:  MAs, few preretinal hemorrhages, several DBH Left:  MAs  I saw and evaluated the patient, participating in the key portions of the service.  I reviewed the resident's note. I agree with the resident's findings and plan. I provided the required supervision for all services billed. I independently reviewed the image(s) and agree with the interpretation and plan as documented in the resident's note.

## 2024-04-07 ENCOUNTER — Other Ambulatory Visit
Admission: RE | Admit: 2024-04-07 | Discharge: 2024-04-07 | Disposition: A | Discharge status: Home / Self Care | Attending: Nephrology | Admitting: Nephrology

## 2024-04-07 DIAGNOSIS — E1122 Type 2 diabetes mellitus with diabetic chronic kidney disease: Secondary | ICD-10-CM | POA: Insufficient documentation

## 2024-04-07 DIAGNOSIS — D899 Disorder involving the immune mechanism, unspecified: Secondary | ICD-10-CM | POA: Insufficient documentation

## 2024-04-07 DIAGNOSIS — Z9483 Pancreas transplant status: Secondary | ICD-10-CM | POA: Diagnosis not present

## 2024-04-07 DIAGNOSIS — Z789 Other specified health status: Secondary | ICD-10-CM | POA: Diagnosis not present

## 2024-04-07 DIAGNOSIS — Z940 Kidney transplant status: Secondary | ICD-10-CM | POA: Diagnosis not present

## 2024-04-07 DIAGNOSIS — D631 Anemia in chronic kidney disease: Secondary | ICD-10-CM | POA: Diagnosis not present

## 2024-04-07 DIAGNOSIS — B259 Cytomegaloviral disease, unspecified: Secondary | ICD-10-CM | POA: Insufficient documentation

## 2024-04-07 DIAGNOSIS — Z79899 Other long term (current) drug therapy: Secondary | ICD-10-CM | POA: Insufficient documentation

## 2024-04-07 DIAGNOSIS — E559 Vitamin D deficiency, unspecified: Secondary | ICD-10-CM | POA: Diagnosis not present

## 2024-04-07 DIAGNOSIS — N189 Chronic kidney disease, unspecified: Secondary | ICD-10-CM | POA: Insufficient documentation

## 2024-04-07 LAB — CBC WITH DIFFERENTIAL/PLATELET
Abs Immature Granulocytes: 0.3 K/uL — ABNORMAL HIGH (ref 0.00–0.07)
Basophils Absolute: 0 K/uL (ref 0.0–0.1)
Basophils Relative: 1 %
Eosinophils Absolute: 0.1 K/uL (ref 0.0–0.5)
Eosinophils Relative: 3 %
HCT: 32.4 % — ABNORMAL LOW (ref 39.0–52.0)
Hemoglobin: 10.4 g/dL — ABNORMAL LOW (ref 13.0–17.0)
Immature Granulocytes: 14 %
Lymphocytes Relative: 13 %
Lymphs Abs: 0.3 K/uL — ABNORMAL LOW (ref 0.7–4.0)
MCH: 29.1 pg (ref 26.0–34.0)
MCHC: 32.1 g/dL (ref 30.0–36.0)
MCV: 90.8 fL (ref 80.0–100.0)
Monocytes Absolute: 0.1 K/uL (ref 0.1–1.0)
Monocytes Relative: 6 %
Neutro Abs: 1.4 K/uL — ABNORMAL LOW (ref 1.7–7.7)
Neutrophils Relative %: 63 %
Platelets: 330 K/uL (ref 150–400)
RBC: 3.57 MIL/uL — ABNORMAL LOW (ref 4.22–5.81)
RDW: 15.1 % (ref 11.5–15.5)
Smear Review: NORMAL
WBC: 2.1 K/uL — ABNORMAL LOW (ref 4.0–10.5)
nRBC: 0 % (ref 0.0–0.2)

## 2024-04-07 LAB — BASIC METABOLIC PANEL WITH GFR
Anion gap: 10 (ref 5–15)
BUN: 40 mg/dL — ABNORMAL HIGH (ref 6–20)
CO2: 21 mmol/L — ABNORMAL LOW (ref 22–32)
Calcium: 10.2 mg/dL (ref 8.9–10.3)
Chloride: 109 mmol/L (ref 98–111)
Creatinine, Ser: 1.6 mg/dL — ABNORMAL HIGH (ref 0.61–1.24)
GFR, Estimated: 58 mL/min — ABNORMAL LOW (ref >60)
Glucose, Bld: 283 mg/dL — ABNORMAL HIGH (ref 70–99)
Potassium: 4.6 mmol/L (ref 3.5–5.1)
Sodium: 140 mmol/L (ref 135–145)

## 2024-04-07 LAB — PHOSPHORUS: Phosphorus: 2.8 mg/dL (ref 2.5–4.6)

## 2024-04-07 LAB — MAGNESIUM: Magnesium: 1.9 mg/dL (ref 1.7–2.4)

## 2024-04-08 LAB — TACROLIMUS LEVEL: Tacrolimus (FK506) - LabCorp: 6.9 ng/mL (ref 5.0–20.0)

## 2024-04-16 ENCOUNTER — Other Ambulatory Visit
Admission: RE | Admit: 2024-04-16 | Discharge: 2024-04-16 | Disposition: A | Discharge status: Home / Self Care | Attending: Nephrology | Admitting: Nephrology

## 2024-04-16 DIAGNOSIS — T8610 Unspecified complication of kidney transplant: Secondary | ICD-10-CM | POA: Insufficient documentation

## 2024-04-16 DIAGNOSIS — Z789 Other specified health status: Secondary | ICD-10-CM | POA: Diagnosis not present

## 2024-04-16 DIAGNOSIS — N390 Urinary tract infection, site not specified: Secondary | ICD-10-CM | POA: Diagnosis not present

## 2024-04-16 DIAGNOSIS — Z940 Kidney transplant status: Secondary | ICD-10-CM | POA: Diagnosis not present

## 2024-04-16 DIAGNOSIS — D899 Disorder involving the immune mechanism, unspecified: Secondary | ICD-10-CM | POA: Insufficient documentation

## 2024-04-16 DIAGNOSIS — D631 Anemia in chronic kidney disease: Secondary | ICD-10-CM | POA: Insufficient documentation

## 2024-04-16 DIAGNOSIS — Z79899 Other long term (current) drug therapy: Secondary | ICD-10-CM | POA: Diagnosis not present

## 2024-04-16 DIAGNOSIS — Z114 Encounter for screening for human immunodeficiency virus [HIV]: Secondary | ICD-10-CM | POA: Insufficient documentation

## 2024-04-16 DIAGNOSIS — Z9483 Pancreas transplant status: Secondary | ICD-10-CM | POA: Insufficient documentation

## 2024-04-16 DIAGNOSIS — E1129 Type 2 diabetes mellitus with other diabetic kidney complication: Secondary | ICD-10-CM | POA: Diagnosis not present

## 2024-04-16 DIAGNOSIS — E559 Vitamin D deficiency, unspecified: Secondary | ICD-10-CM | POA: Diagnosis not present

## 2024-04-16 DIAGNOSIS — B259 Cytomegaloviral disease, unspecified: Secondary | ICD-10-CM | POA: Insufficient documentation

## 2024-04-16 LAB — CBC WITH DIFFERENTIAL/PLATELET
Abs Immature Granulocytes: 0.34 K/uL — ABNORMAL HIGH (ref 0.00–0.07)
Basophils Absolute: 0 K/uL (ref 0.0–0.1)
Basophils Relative: 1 %
Eosinophils Absolute: 0.1 K/uL (ref 0.0–0.5)
Eosinophils Relative: 3 %
HCT: 33.3 % — ABNORMAL LOW (ref 39.0–52.0)
Hemoglobin: 11 g/dL — ABNORMAL LOW (ref 13.0–17.0)
Immature Granulocytes: 16 %
Lymphocytes Relative: 21 %
Lymphs Abs: 0.4 K/uL — ABNORMAL LOW (ref 0.7–4.0)
MCH: 29.7 pg (ref 26.0–34.0)
MCHC: 33 g/dL (ref 30.0–36.0)
MCV: 90 fL (ref 80.0–100.0)
Monocytes Absolute: 0.2 K/uL (ref 0.1–1.0)
Monocytes Relative: 8 %
Neutro Abs: 1.1 K/uL — ABNORMAL LOW (ref 1.7–7.7)
Neutrophils Relative %: 51 %
Platelets: 191 K/uL (ref 150–400)
RBC: 3.7 MIL/uL — ABNORMAL LOW (ref 4.22–5.81)
RDW: 14.6 % (ref 11.5–15.5)
Smear Review: NORMAL
WBC: 2.1 K/uL — ABNORMAL LOW (ref 4.0–10.5)
nRBC: 0 % (ref 0.0–0.2)

## 2024-04-16 LAB — BASIC METABOLIC PANEL WITH GFR
Anion gap: 15 (ref 5–15)
BUN: 46 mg/dL — ABNORMAL HIGH (ref 6–20)
CO2: 20 mmol/L — ABNORMAL LOW (ref 22–32)
Calcium: 10.3 mg/dL (ref 8.9–10.3)
Chloride: 101 mmol/L (ref 98–111)
Creatinine, Ser: 2.38 mg/dL — ABNORMAL HIGH (ref 0.61–1.24)
GFR, Estimated: 36 mL/min — ABNORMAL LOW (ref >60)
Glucose, Bld: 217 mg/dL — ABNORMAL HIGH (ref 70–99)
Potassium: 4.3 mmol/L (ref 3.5–5.1)
Sodium: 136 mmol/L (ref 135–145)

## 2024-04-16 LAB — PHOSPHORUS: Phosphorus: 2.5 mg/dL (ref 2.5–4.6)

## 2024-04-16 LAB — MAGNESIUM: Magnesium: 1.9 mg/dL (ref 1.7–2.4)

## 2024-04-18 LAB — TACROLIMUS LEVEL: Tacrolimus (FK506) - LabCorp: 10.5 ng/mL (ref 5.0–20.0)

## 2024-04-26 ENCOUNTER — Other Ambulatory Visit
Admission: RE | Admit: 2024-04-26 | Discharge: 2024-04-26 | Disposition: A | Attending: Nephrology | Admitting: Nephrology

## 2024-04-26 DIAGNOSIS — E1122 Type 2 diabetes mellitus with diabetic chronic kidney disease: Secondary | ICD-10-CM | POA: Diagnosis not present

## 2024-04-26 DIAGNOSIS — N189 Chronic kidney disease, unspecified: Secondary | ICD-10-CM | POA: Insufficient documentation

## 2024-04-26 DIAGNOSIS — D899 Disorder involving the immune mechanism, unspecified: Secondary | ICD-10-CM | POA: Diagnosis not present

## 2024-04-26 DIAGNOSIS — Z940 Kidney transplant status: Secondary | ICD-10-CM | POA: Insufficient documentation

## 2024-04-26 DIAGNOSIS — Z789 Other specified health status: Secondary | ICD-10-CM | POA: Diagnosis not present

## 2024-04-26 DIAGNOSIS — Z9483 Pancreas transplant status: Secondary | ICD-10-CM | POA: Insufficient documentation

## 2024-04-26 DIAGNOSIS — Z79899 Other long term (current) drug therapy: Secondary | ICD-10-CM | POA: Diagnosis not present

## 2024-04-26 DIAGNOSIS — D631 Anemia in chronic kidney disease: Secondary | ICD-10-CM | POA: Insufficient documentation

## 2024-04-26 DIAGNOSIS — E559 Vitamin D deficiency, unspecified: Secondary | ICD-10-CM | POA: Insufficient documentation

## 2024-04-26 LAB — CBC WITH DIFFERENTIAL/PLATELET
Abs Immature Granulocytes: 0.41 K/uL — ABNORMAL HIGH (ref 0.00–0.07)
Basophils Absolute: 0 K/uL (ref 0.0–0.1)
Basophils Relative: 1 %
Eosinophils Absolute: 0.1 K/uL (ref 0.0–0.5)
Eosinophils Relative: 2 %
HCT: 30.2 % — ABNORMAL LOW (ref 39.0–52.0)
Hemoglobin: 9.6 g/dL — ABNORMAL LOW (ref 13.0–17.0)
Immature Granulocytes: 11 %
Lymphocytes Relative: 5 %
Lymphs Abs: 0.2 K/uL — ABNORMAL LOW (ref 0.7–4.0)
MCH: 29.4 pg (ref 26.0–34.0)
MCHC: 31.8 g/dL (ref 30.0–36.0)
MCV: 92.4 fL (ref 80.0–100.0)
Monocytes Absolute: 0.7 K/uL (ref 0.1–1.0)
Monocytes Relative: 19 %
Neutro Abs: 2.3 K/uL (ref 1.7–7.7)
Neutrophils Relative %: 62 %
Platelets: 381 K/uL (ref 150–400)
RBC: 3.27 MIL/uL — ABNORMAL LOW (ref 4.22–5.81)
RDW: 14.4 % (ref 11.5–15.5)
Smear Review: NORMAL
WBC: 3.7 K/uL — ABNORMAL LOW (ref 4.0–10.5)
nRBC: 0 % (ref 0.0–0.2)

## 2024-04-26 LAB — MAGNESIUM: Magnesium: 1.7 mg/dL (ref 1.7–2.4)

## 2024-04-26 LAB — BASIC METABOLIC PANEL WITH GFR
Anion gap: 9 (ref 5–15)
BUN: 24 mg/dL — ABNORMAL HIGH (ref 6–20)
CO2: 24 mmol/L (ref 22–32)
Calcium: 9.5 mg/dL (ref 8.9–10.3)
Chloride: 104 mmol/L (ref 98–111)
Creatinine, Ser: 1.25 mg/dL — ABNORMAL HIGH (ref 0.61–1.24)
GFR, Estimated: >60 mL/min (ref >60)
Glucose, Bld: 117 mg/dL — ABNORMAL HIGH (ref 70–99)
Potassium: 4.6 mmol/L (ref 3.5–5.1)
Sodium: 137 mmol/L (ref 135–145)

## 2024-04-26 LAB — PHOSPHORUS: Phosphorus: 1.4 mg/dL — ABNORMAL LOW (ref 2.5–4.6)

## 2024-04-27 LAB — TACROLIMUS LEVEL: Tacrolimus (FK506) - LabCorp: 6.2 ng/mL (ref 5.0–20.0)

## 2024-05-06 ENCOUNTER — Other Ambulatory Visit
Admission: RE | Admit: 2024-05-06 | Discharge: 2024-05-06 | Disposition: A | Discharge status: Home / Self Care | Attending: Nephrology | Admitting: Nephrology

## 2024-05-06 LAB — CBC WITH DIFFERENTIAL/PLATELET
Abs Immature Granulocytes: 0.57 K/uL — ABNORMAL HIGH (ref 0.00–0.07)
Basophils Absolute: 0.1 K/uL (ref 0.0–0.1)
Basophils Relative: 1 %
Eosinophils Absolute: 0.2 K/uL (ref 0.0–0.5)
Eosinophils Relative: 4 %
HCT: 32.5 % — ABNORMAL LOW (ref 39.0–52.0)
Hemoglobin: 10 g/dL — ABNORMAL LOW (ref 13.0–17.0)
Immature Granulocytes: 14 %
Lymphocytes Relative: 11 %
Lymphs Abs: 0.4 K/uL — ABNORMAL LOW (ref 0.7–4.0)
MCH: 29.2 pg (ref 26.0–34.0)
MCHC: 30.8 g/dL (ref 30.0–36.0)
MCV: 95 fL (ref 80.0–100.0)
Monocytes Absolute: 0.5 K/uL (ref 0.1–1.0)
Monocytes Relative: 12 %
Neutro Abs: 2.3 K/uL (ref 1.7–7.7)
Neutrophils Relative %: 58 %
Platelets: 302 K/uL (ref 150–400)
RBC: 3.42 MIL/uL — ABNORMAL LOW (ref 4.22–5.81)
RDW: 14.6 % (ref 11.5–15.5)
Smear Review: NORMAL
WBC: 4 K/uL (ref 4.0–10.5)
nRBC: 0 % (ref 0.0–0.2)

## 2024-05-06 LAB — MAGNESIUM: Magnesium: 1.8 mg/dL (ref 1.7–2.4)

## 2024-05-06 LAB — BASIC METABOLIC PANEL WITH GFR
Anion gap: 10 (ref 5–15)
BUN: 30 mg/dL — ABNORMAL HIGH (ref 6–20)
CO2: 24 mmol/L (ref 22–32)
Calcium: 10.1 mg/dL (ref 8.9–10.3)
Chloride: 105 mmol/L (ref 98–111)
Creatinine, Ser: 1.63 mg/dL — ABNORMAL HIGH (ref 0.61–1.24)
GFR, Estimated: 57 mL/min — ABNORMAL LOW (ref >60)
Glucose, Bld: 247 mg/dL — ABNORMAL HIGH (ref 70–99)
Potassium: 5 mmol/L (ref 3.5–5.1)
Sodium: 138 mmol/L (ref 135–145)

## 2024-05-06 LAB — PHOSPHORUS: Phosphorus: 2.6 mg/dL (ref 2.5–4.6)

## 2024-05-07 LAB — TACROLIMUS LEVEL: Tacrolimus (FK506) - LabCorp: 8.1 ng/mL (ref 5.0–20.0)

## 2024-05-14 NOTE — H&P (Signed)
 ------------------------------------------------------------------------------- Attestation signed by Jamesetta Corean RAMAN, MD at 05/15/24 1219   I saw and evaluated the patient, participating in the key portions of the service.  I reviewed the residents note.  I agree with the residents findings and plan. Corean RAMAN Jamesetta, MD  Start thymoglobulin today x 3 doses with methypred. Will reduce cellcept to 250mg  bid while on thymoglobulin and then increase once done with thymo. Continue bactrim and restart valcyte x 3 months post thymoglobulin        -------------------------------------------------------------------------------  Nephrology ([facility]) History & Physical  Assessment & Plan:  Darrell Walls is a 33 y.o. 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 Dande. Post-op course c/b early mixed rejection, treated with thymo x7d (8/27-9/2), solumedrol/pred taper, ritux 9/5, PLEX and IVIG. Cr fluctuating recently ~1.2-1.6. Underwent renal bx 12/15 that demonstrated acute T cell mediated rejection Banff 1b.  -Methylpred 500mg  daily x3 doses (12/20-12/22) followed by pred taper: 60mg  daily x2d, 40mg  daily x2d, 20mg  daily x2d, 10mg  daily until outpatient follow up -Thymo 1.5mg /kg x3d -PPx: Pepcid , Bactrim MWF -Discuss CMV, HSV ppx in AM -DSA -AT1R Ab -Continue cellcept 750mg  BID -Continue tac 4mg  AM/3mg  PM, goal trough 6-8, pharmacy assistance w  dosing -Holding aspirin, DVT ppx post biopsy (12/15) -Daily RFP  T2DM on chronic insulin  Last A1c 8.1% on 04/09/24. Home regimen is lantus  16u daily, humalog 16-18u mealtime,SSI and metformin 500mg  BID. Anticipate needing insulin  adjustments while on steroids. -Lantus  16u daily -Lispro 16u TIDAC -SSI -Metformin 500mg  BID  The patient's presentation is complicated by the following clinically significant conditions requiring additional evaluation and treatment: - Chronic kidney disease POA requiring further investigation, treatment, or monitoring  - Anemia requiring at least daily CBC for further monitoring - Immunocompromise state POA requiring further investigation, treatment, or monitoring   Issues Impacting Complexity of Management: <redacted file path> -Intensive monitoring of drug toxicity from Tacrolimus  with scheduled levels -The patient is at high risk of complications from transplant rejection   Medical Decision Making: Reviewed records from the following unique sources nephrology notes.   Chronic Problems   Insomnia/Depression- home Seroquel 150mg  nightly, zoloft  100mg , trazodone  100mg  nightly PRN  Anemia of Chronic Disease Baseline Hb ~10-11, Hb 9.4 on admission. Ferritin 912 on 12/15 consistent w anemia of chronic disease. -Daily CBC  Checklist: Diet: Regular Diet DVT PPx: Contraindicated - High Risk for Bleeding/Active Bleeding Code Status: Full Code Dispo: Patient appropriate for Inpatient based on expectation of ongoing need for hospitalization greater than two midnights based on severity of presentation/services including acute T cell mediated rejection  Team Contact Information:  Primary Team: Nephrology ([facility]) Primary Resident: Jacquline LITTIE Laine, MD Resident's Pager: [phone] (Nephrology Senior Resident)  Chief Concern:  Acute rejection of renal transplant (HHS-HCC)  Subjective:  Darrell Walls is a 33 y.o. male with pertinent PMHx of LDKT (12/2023)  for diabetic nephropathy, T2DM, HTN, HLD, anemia of chronic disease presenting with acute-T cell mediated transplant rejection.   History obtained by patient.   HPI:  Has been feeling good but  underwent renal biopsy and has acute T cell mediated rejection so that's why he's here. Denied fever, pain over transplant kidney, changes in urination, abdominal pain, dysuria, vomiting. Has had a cough and runny nose for the past month, cough now resolved but still has some clear nasal discharge.  On arrival to [facility], afebrile, HR 99, normotensive, satting on RA. Labs from 12/15 notable for Hb 9.4 (bl ~10-11), Cr 1.49 (recent Cr fluctuating from ~1.2-1.6). Iron sat 9%, ferritin 912.   Given elevated Cr, underwent kidney biopsy on 12/15, demonstrated acute T cell mediated rejection Banff 1b.   Pertinent Surgical Hx LDKT 12/2023  Pertinent Family Hx NA  Pertinent Social Hx  Mom assists w medications.  Allergies Patient has no known allergies.  I reviewed the Medication List. The current list is Accurate Prior to Admission medications  Medication Dose, Route, Frequency  acetaminophen  (TYLENOL ) 500 MG tablet 500-1,000 mg, Oral, Every 8 hours PRN  albuterol HFA 90 mcg/actuation inhaler 2 puffs, Inhalation, Every 6 hours PRN  aspirin 81 MG chewable tablet 81 mg, Oral, Daily (standard)  atorvastatin  (LIPITOR) 20 MG tablet 20 mg, Oral, Daily (standard)  ergocalciferol (VITAMIN D2) 1,250 mcg (50,000 unit) capsule 1,250 mcg, Oral, Weekly Patient taking differently: Take 1 capsule (1,250 mcg total) by mouth once a week. Taking on Tuesdays  famotidine  (PEPCID ) 10 MG tablet 10 mg, Oral, Daily PRN  gabapentin  (NEURONTIN ) 300 MG capsule 300 mg, Oral, Nightly  insulin  glargine (BASAGLAR , LANTUS ) 100 unit/mL (3 mL) injection pen 18 Units, Subcutaneous, Nightly Patient taking differently: Inject 0.16 mL (16 Units total) under the skin nightly.  insulin  lispro (HUMALOG) 100 unit/mL injection pen 10-16 Units,  Subcutaneous, 3 times a day (AC), Inject 10 units with meals and use correctional insulin  (1 unit for every 50 mg/dL over 849 mg/dL) with meals. Max 48 units/day. Patient taking differently: Inject 16-18 Units under the skin Three (3) times a day before meals. Taking 16-18 units with meals and use correctional insulin  (1 unit for every 50 mg/dL over 849 mg/dL) with meals. Max 48 units/day.  metFORMIN (GLUCOPHAGE-XR) 500 MG 24 hr tablet 500 mg, Oral, 2 times a day  mycophenolate (CELLCEPT) 250 mg capsule 750 mg, Oral, 2 times a day  predniSONE (DELTASONE) 5 MG tablet 5 mg, Oral, Daily (standard)  QUEtiapine 150 mg Tab Take 1 tablet (150 mg) by mouth nightly.  sertraline  (ZOLOFT ) 100 MG tablet 100 mg, Oral, Daily (standard)  sodium zirconium cyclosilicate (LOKELMA) 10 gram PwPk packet 10 g, Oral, Daily (standard), Mix as directed on package. Patient taking differently: Take 1 packet (10 g total) by mouth daily. Mix as directed on package. Taking 3x weekly  sulfamethoxazole-trimethoprim (BACTRIM) 400-80 mg per tablet 1 tablet, Oral, Every Monday, Wednesday, Friday  tacrolimus  (PROGRAF ) 1 MG capsule Take 4 capsules (4 mg total) by mouth in the morning AND 3 capsules (3 mg total) nightly.  traZODone  (DESYREL ) 100 MG tablet 100 mg, Oral, Daily PRN  blood sugar diagnostic (GLUCOSE BLOOD) Strp Ok to sub any brand preferred by insurance/patient to match with meter, use 3x/day, dx E11.65  blood-glucose meter kit Use as instructed  clindamycin (CLEOCIN T) 1 % lotion Topical, 2 times a day (standard)  DEXCOM G7 RECEIVER Misc Use as directed Type 2 Diabetes uncontrolled (E11.65)  DEXCOM G7 SENSOR Devi Apply Dexcom G7 sensor every 10 days. Type 2 Diabetes uncontrolled (E11.65)  EPINEPHrine  (EPIPEN ) 0.3 mg/0.3 mL injection 0.3 mg, Intramuscular, Once as needed  insulin  syringe-needle U-100 1 mL 29 gauge x  1/2 (12 mm) Syrg Use with insulin  each.  lancets Misc Dispense 100 lancets, ok to sub any brand preferred  by insurance/patient to match lancing device, use 4x/day, dx E11.65  lancets Misc Use to check blood sugar as directed with insulin  3 times a day & for symptoms of high or low blood sugar.  metFORMIN (GLUCOPHAGE-XR) 500 MG 24 hr tablet 500 mg, Oral, 2 times a day  pen needle, diabetic (BD ULTRA-FINE SHORT PEN NEEDLE) 31 gauge x 5/16 (8 mm) Ndle USE AS DIRECTED FOR  INSULIN   ADMINISTRATION  pen needle, diabetic (PEN NEEDLE) 32 gauge x 5/32 (4 mm) Ndle 1 each, Miscellaneous, 4 times a day  pen needle, diabetic 29 gauge x 1/2 (12 mm) Ndle Use with each insulin  use.  pen needle, diabetic 32 gauge x 5/32 (4 mm) Ndle Use with insulin  up to 4 times/day as needed.    Designated Environmental Health Practitioner: Mr. Hansen currently has decisional capacity for healthcare decision-making and is able to designate a surrogate healthcare decision maker. Mr. Kutch designated healthcare decision maker(s) is/are Sinclair Kingsley (the patient's parent) as denoted by stated patient preference.  Objective:  Physical Exam: Temp:  [36.7 C (98.1 F)] 36.7 C (98.1 F) Pulse:  [99] 99 SpO2 Pulse:  [99] 99 Resp:  [18] 18 BP: (133)/(87) 133/87 SpO2:  [100 %] 100 %  Gen: NAD, converses  Eyes: Sclera anicteric, EOMI grossly normal  HENT: Atraumatic, normocephalic Neck: Trachea midline Heart: RRR, no murmurs Lungs: CTAB, no crackles or wheezes Abdomen: Soft, NTND, no pain over transplant kidney Extremities: No edema Neuro: Grossly symmetric, non-focal   Skin:  chronic appearing darkened skin patches on bilateral legs Psych: Alert, oriented  Jacquline Laine, MD Internal Medicine PGY3

## 2024-05-18 NOTE — Care Plan (Signed)
 "       Care Management Final Transition Planning Assessment    05/17/2024  Patient's Post Acute Contact Information: Mobile Phone:  [phone]     Has a PCP appointment been made?: No   Has a specialist appointment been made?: Yes Future Appointments  Date Time Provider Department Center  05/24/2024  8:45 AM UNCTIF 03 [facility]  05/31/2024  4:00 PM UNCTIF 13 [facility]  06/01/2024  1:00 PM [facility]  06/01/2024  1:40 PM Tyree Delon Jansky, MD [facility]  06/16/2024 10:00 AM Kennyth Marcellus POUR, MD [facility]      Post Acute Facility needed at discharge?: No        Home Care/ Home Medical Equipment needed at discharge?: No           Outpatient/Community Referrals needed for discharge?: No       Transportation Anticipated: family or friend will provide    Currently receiving outpatient dialysis?: No       Discharge Disposition: Home w/ Self Care     IMM Delivery Follow Up Important Message (IM) letter given to patient and/or family?: Yes IMM Delivered by: The IMM was delivered and signed by patient IMM Delivery Date: 05/17/24 (mm process complete. pt verbalized understanding) IMM Delivery Time: 1352 The beneficiary verbalized understanding of the rights of the hospitalized patient and the right to appeal the discharge decision?: Yes The patient/authorized representative was advised they have 4 hours to consider their right to request a QIO review prior to leaving the hospital, if the physician orders discharge today.: Yes The patient/authorized representative verbalized understanding of this right.: Yes  Quality data for continuing care services shared with patient and/or representative?: N/A Patient and/or family were provided with choice of facilities / services that are available and appropriate to meet post hospital care needs?: N/A       Final Assessment Complete: Yes                      Readmission Risk Score:  Predictive Model Details        48% (High)  Factor Value   Calculated 05/17/2024 16:00 20% Number of active inpatient medication orders 52   [facility] Risk of Unplanned Readmission Model 13% Number of ED visits in last six months 4   *Archived Data 12% Diagnosis of drug abuse present    8% Number of hospitalizations in last year 3    5% Active antipsychotic inpatient medication order present    5% ECG/EKG order present in last 6 months    5% Latest BUN high (43 mg/dL)    4% Encounter of ten days or longer in last year present    4% Imaging order present in last 6 months    3% Latest hemoglobin low (10.8 g/dL)    3% Phosphorous result present    3% Diagnosis of deficiency anemia present    3% Active corticosteroid inpatient medication order present    3% Latest creatinine high (1.56 mg/dL)    3% Active anticoagulant inpatient medication order present    2% Diagnosis of renal failure present    2% Current length of stay 2.8 days    2% Age 33    1% Future appointment scheduled    1% Charlson Comorbidity Index 1    1% Active ulcer inpatient medication order present    Patient to d/c to home, under care of self/family.  Will f/up  as outpatient in [facility] (see above).  Following services arranged for d/c: n/a.  No other needs identified at this time.  Delon LITTIE Ferraris, LCSW May 18, 2024 1:16 PM  "

## 2024-05-31 NOTE — Telephone Encounter (Signed)
 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  Confirmed that patient is not taking Vitamin C supplements. Also advised of foods high in Calcium  Oxalate and that per Dr Kennyth, he will need to meet with Dagoberto, our Registered Dietician.  Message sent to schedulers as well.     He will need repeat biopsy in 4 weeks (patient agreeable to February 5th).  Transplant team will schedule.

## 2024-06-03 NOTE — Progress Notes (Signed)
 [facility] Refill Coordination Note    Specialty Medication(s) to be Shipped:   Transplant: mycophenolate  mofetil 250mg  and tacrolimus  1mg     Other medication(s) to be shipped: metformin , quetiapine , atovastatin, bactrim , trazdone, humalog , pen needles    Specialty Medications not needed at this time: N/A     Darrell Walls, DOB: [birth year]  Phone: [phone] (home)       All above HIPAA information was verified with patient.     Was a nurse, learning disability used for this call? No    Completed refill call assessment today to schedule patient's medication shipment from the [facility] Delivery [facility]  [phone]).  All relevant notes have been reviewed.     Specialty medication(s) and dose(s) confirmed: Regimen is correct and unchanged.   Changes to medications: Ruffin reports no changes at this time.  Changes to insurance: No  New side effects reported not previously addressed with a pharmacist or physician: None reported  Questions for the pharmacist: No    Confirmed patient received a Conservation Officer, Historic Buildings and a Surveyor, Mining with first shipment. The patient will receive a drug information handout for each medication shipped and additional FDA Medication Guides as required.       DISEASE/MEDICATION-SPECIFIC INFORMATION        N/A    SPECIALTY MEDICATION ADHERENCE     Medication Adherence    Patient reported X missed doses in the last month: 0  Specialty Medication: tacrolimus  1 MG capsule (PROGRAF )  Patient is on additional specialty medications: Yes  Additional Specialty Medications: mycophenolate  250 mg capsule (CELLCEPT )  Patient Reported Additional Medication X Missed Doses in the Last Month: 0  Patient is on more than two specialty medications: No              Were doses missed due to medication being on hold? No    tacrolimus  1 MG capsule (PROGRAF ) : 7 days of medicine on hand   mycophenolate  250 mg capsule (CELLCEPT ) : 7 days of medicine on hand       Specialty medication is an injection or given on a cycle: No    REFERRAL TO PHARMACIST     Referral to the pharmacist: Not needed      [facility]     Shipping address confirmed in Epic.     Cost and Payment: Patient has a copay of $21.17. They are aware and have authorized the pharmacy to charge the credit card on file.    Delivery Scheduled: Yes, Expected medication delivery date: 01/13.     Medication will be delivered via Next Day Courier to the prescription address in Epic [place].    Darrell Walls   [place] [facility]  Specialty Technician

## 2024-06-04 ENCOUNTER — Other Ambulatory Visit
Admission: RE | Admit: 2024-06-04 | Discharge: 2024-06-04 | Disposition: A | Attending: Nephrology | Admitting: Nephrology

## 2024-06-04 DIAGNOSIS — E1129 Type 2 diabetes mellitus with other diabetic kidney complication: Secondary | ICD-10-CM | POA: Diagnosis not present

## 2024-06-04 DIAGNOSIS — D899 Disorder involving the immune mechanism, unspecified: Secondary | ICD-10-CM | POA: Insufficient documentation

## 2024-06-04 DIAGNOSIS — Z940 Kidney transplant status: Secondary | ICD-10-CM | POA: Insufficient documentation

## 2024-06-04 DIAGNOSIS — Z09 Encounter for follow-up examination after completed treatment for conditions other than malignant neoplasm: Secondary | ICD-10-CM | POA: Diagnosis not present

## 2024-06-04 DIAGNOSIS — Z114 Encounter for screening for human immunodeficiency virus [HIV]: Secondary | ICD-10-CM | POA: Diagnosis not present

## 2024-06-04 DIAGNOSIS — Z9483 Pancreas transplant status: Secondary | ICD-10-CM | POA: Insufficient documentation

## 2024-06-04 DIAGNOSIS — Z789 Other specified health status: Secondary | ICD-10-CM | POA: Insufficient documentation

## 2024-06-04 DIAGNOSIS — E559 Vitamin D deficiency, unspecified: Secondary | ICD-10-CM | POA: Diagnosis not present

## 2024-06-04 DIAGNOSIS — T8610 Unspecified complication of kidney transplant: Secondary | ICD-10-CM | POA: Diagnosis not present

## 2024-06-04 DIAGNOSIS — B259 Cytomegaloviral disease, unspecified: Secondary | ICD-10-CM | POA: Insufficient documentation

## 2024-06-04 DIAGNOSIS — N390 Urinary tract infection, site not specified: Secondary | ICD-10-CM | POA: Insufficient documentation

## 2024-06-04 DIAGNOSIS — Z79899 Other long term (current) drug therapy: Secondary | ICD-10-CM | POA: Insufficient documentation

## 2024-06-04 DIAGNOSIS — Y830 Surgical operation with transplant of whole organ as the cause of abnormal reaction of the patient, or of later complication, without mention of misadventure at the time of the procedure: Secondary | ICD-10-CM | POA: Insufficient documentation

## 2024-06-04 DIAGNOSIS — D631 Anemia in chronic kidney disease: Secondary | ICD-10-CM | POA: Insufficient documentation

## 2024-06-04 LAB — CBC WITH DIFFERENTIAL/PLATELET
Abs Immature Granulocytes: 0.18 K/uL — ABNORMAL HIGH (ref 0.00–0.07)
Basophils Absolute: 0 K/uL (ref 0.0–0.1)
Basophils Relative: 1 %
Eosinophils Absolute: 0 K/uL (ref 0.0–0.5)
Eosinophils Relative: 2 %
HCT: 35.2 % — ABNORMAL LOW (ref 39.0–52.0)
Hemoglobin: 11.3 g/dL — ABNORMAL LOW (ref 13.0–17.0)
Immature Granulocytes: 12 %
Lymphocytes Relative: 11 %
Lymphs Abs: 0.2 K/uL — ABNORMAL LOW (ref 0.7–4.0)
MCH: 29.4 pg (ref 26.0–34.0)
MCHC: 32.1 g/dL (ref 30.0–36.0)
MCV: 91.7 fL (ref 80.0–100.0)
Monocytes Absolute: 0.1 K/uL (ref 0.1–1.0)
Monocytes Relative: 8 %
Neutro Abs: 1 K/uL — ABNORMAL LOW (ref 1.7–7.7)
Neutrophils Relative %: 66 %
Platelets: 220 K/uL (ref 150–400)
RBC: 3.84 MIL/uL — ABNORMAL LOW (ref 4.22–5.81)
RDW: 14.3 % (ref 11.5–15.5)
Smear Review: NORMAL
WBC: 1.5 K/uL — ABNORMAL LOW (ref 4.0–10.5)
nRBC: 0 % (ref 0.0–0.2)

## 2024-06-04 LAB — BASIC METABOLIC PANEL WITH GFR
Anion gap: 10 (ref 5–15)
BUN: 44 mg/dL — ABNORMAL HIGH (ref 6–20)
CO2: 23 mmol/L (ref 22–32)
Calcium: 9.7 mg/dL (ref 8.9–10.3)
Chloride: 103 mmol/L (ref 98–111)
Creatinine, Ser: 2.66 mg/dL — ABNORMAL HIGH (ref 0.61–1.24)
GFR, Estimated: 32 mL/min — ABNORMAL LOW
Glucose, Bld: 362 mg/dL — ABNORMAL HIGH (ref 70–99)
Potassium: 4.8 mmol/L (ref 3.5–5.1)
Sodium: 135 mmol/L (ref 135–145)

## 2024-06-04 LAB — MAGNESIUM: Magnesium: 2.1 mg/dL (ref 1.7–2.4)

## 2024-06-04 LAB — PHOSPHORUS: Phosphorus: 3.2 mg/dL (ref 2.5–4.6)

## 2024-06-06 LAB — TACROLIMUS LEVEL: Tacrolimus (FK506) - LabCorp: 10.3 ng/mL (ref 5.0–20.0)

## 2024-06-10 ENCOUNTER — Other Ambulatory Visit
Admission: RE | Admit: 2024-06-10 | Discharge: 2024-06-10 | Disposition: A | Discharge status: Home / Self Care | Attending: Nephrology | Admitting: Nephrology

## 2024-06-10 DIAGNOSIS — N390 Urinary tract infection, site not specified: Secondary | ICD-10-CM | POA: Diagnosis not present

## 2024-06-10 DIAGNOSIS — Z114 Encounter for screening for human immunodeficiency virus [HIV]: Secondary | ICD-10-CM | POA: Insufficient documentation

## 2024-06-10 DIAGNOSIS — E1122 Type 2 diabetes mellitus with diabetic chronic kidney disease: Secondary | ICD-10-CM | POA: Insufficient documentation

## 2024-06-10 DIAGNOSIS — N189 Chronic kidney disease, unspecified: Secondary | ICD-10-CM | POA: Diagnosis not present

## 2024-06-10 DIAGNOSIS — Z09 Encounter for follow-up examination after completed treatment for conditions other than malignant neoplasm: Secondary | ICD-10-CM | POA: Diagnosis present

## 2024-06-10 DIAGNOSIS — Z789 Other specified health status: Secondary | ICD-10-CM | POA: Diagnosis not present

## 2024-06-10 DIAGNOSIS — Z940 Kidney transplant status: Secondary | ICD-10-CM | POA: Diagnosis not present

## 2024-06-10 DIAGNOSIS — Z9483 Pancreas transplant status: Secondary | ICD-10-CM | POA: Insufficient documentation

## 2024-06-10 DIAGNOSIS — B259 Cytomegaloviral disease, unspecified: Secondary | ICD-10-CM | POA: Insufficient documentation

## 2024-06-10 DIAGNOSIS — D631 Anemia in chronic kidney disease: Secondary | ICD-10-CM | POA: Diagnosis not present

## 2024-06-10 DIAGNOSIS — Z79899 Other long term (current) drug therapy: Secondary | ICD-10-CM | POA: Insufficient documentation

## 2024-06-10 DIAGNOSIS — D899 Disorder involving the immune mechanism, unspecified: Secondary | ICD-10-CM | POA: Insufficient documentation

## 2024-06-10 DIAGNOSIS — E559 Vitamin D deficiency, unspecified: Secondary | ICD-10-CM | POA: Insufficient documentation

## 2024-06-10 LAB — CBC WITH DIFFERENTIAL/PLATELET
Abs Immature Granulocytes: 0.3 K/uL — ABNORMAL HIGH (ref 0.00–0.07)
Basophils Absolute: 0 K/uL (ref 0.0–0.1)
Basophils Relative: 1 %
Eosinophils Absolute: 0.1 K/uL (ref 0.0–0.5)
Eosinophils Relative: 5 %
HCT: 39.5 % (ref 39.0–52.0)
Hemoglobin: 12.4 g/dL — ABNORMAL LOW (ref 13.0–17.0)
Immature Granulocytes: 17 %
Lymphocytes Relative: 11 %
Lymphs Abs: 0.2 K/uL — ABNORMAL LOW (ref 0.7–4.0)
MCH: 29 pg (ref 26.0–34.0)
MCHC: 31.4 g/dL (ref 30.0–36.0)
MCV: 92.5 fL (ref 80.0–100.0)
Monocytes Absolute: 0.2 K/uL (ref 0.1–1.0)
Monocytes Relative: 10 %
Neutro Abs: 1 K/uL — ABNORMAL LOW (ref 1.7–7.7)
Neutrophils Relative %: 56 %
Platelets: 266 K/uL (ref 150–400)
RBC: 4.27 MIL/uL (ref 4.22–5.81)
RDW: 14.8 % (ref 11.5–15.5)
Smear Review: NORMAL
WBC: 1.6 K/uL — ABNORMAL LOW (ref 4.0–10.5)
nRBC: 0 % (ref 0.0–0.2)

## 2024-06-10 LAB — BASIC METABOLIC PANEL WITH GFR
Anion gap: 10 (ref 5–15)
BUN: 32 mg/dL — ABNORMAL HIGH (ref 6–20)
CO2: 18 mmol/L — ABNORMAL LOW (ref 22–32)
Calcium: 10 mg/dL (ref 8.9–10.3)
Chloride: 111 mmol/L (ref 98–111)
Creatinine, Ser: 1.61 mg/dL — ABNORMAL HIGH (ref 0.61–1.24)
GFR, Estimated: 58 mL/min — ABNORMAL LOW
Glucose, Bld: 201 mg/dL — ABNORMAL HIGH (ref 70–99)
Potassium: 4.6 mmol/L (ref 3.5–5.1)
Sodium: 139 mmol/L (ref 135–145)

## 2024-06-10 LAB — PHOSPHORUS: Phosphorus: 3.7 mg/dL (ref 2.5–4.6)

## 2024-06-10 LAB — MAGNESIUM: Magnesium: 2 mg/dL (ref 1.7–2.4)

## 2024-06-10 LAB — PATHOLOGIST SMEAR REVIEW

## 2024-06-12 LAB — TACROLIMUS LEVEL: Tacrolimus (FK506) - LabCorp: 11.2 ng/mL (ref 5.0–20.0)

## 2024-06-14 LAB — CYTOMEGALOVIRUS DNA, QUANTITATIVE REAL-TIME PCR, PLASMA

## 2024-06-14 NOTE — Progress Notes (Signed)
 " The following medication is onboarded in this note: Mycophenolate 17.78/90ds   [facility]   Patient Onboarding/Medication Counseling  Darrell Walls is a 34 y.o. male with kidney transplant who I am counseling today on change in formulation from mycophenolate generic cellcept capsules to mycophenolate generic myfortic tablets of therapy.  I am speaking to the patient.patient is aware generic myfortic REPLACES generic cellcept, NOT to use them together  Was a translator used for this call? No  Verified patient's date of birth / HIPAA.  Specialty medication(s) to be sent: n/a   Non-specialty medications/supplies to be sent: n/a   Medications not needed at this time: n/a     The patient declined counseling on goals of therapy, side effects and monitoring parameters, warnings and precautions, and storage, handling precautions, and disposal because they were counseled in clinic and have taken different formulation previously. The information in the declined sections below are for informational purposes only and was not discussed with patient.  Myfortic (mycophenolic acid)  Medication & Administration   Dosage:   Take 1 tablet (360mg  total) by mouth three times daily  Administration:  Take with or without food, although taking with food helps minimize GI side effects. Swallow the pills whole, do not chew or crush  Adherence/Missed dose instructions: Take a missed dose as soon as you think about it. If it is less than 2 hours until your next dose, skip the missed dose and go back to your normal time. Do not take 2 doses at the same time or extra doses.  Goals of Therapy   To prevent organ rejection  Side Effects & Monitoring Parameters   Common side effects Back or joint pain Constipation Headache/dizziness Not hungry Stomach pain, diarrhea, constipation, gas, upset stomach, vomiting, nausea Feeling tired or weak Shakiness Trouble  sleeping Increased risk of infection  The following side effects should be reported to the provider: Allergic reaction High blood sugar (confusion, feeling sleepy, more thirst, more hungry, passing urine more often, flushing, fast breathing, or breath that smells like fruit) Electrolyte issues (mood changes, confusion, muscle pain or weakness, a heartbeat that does not feel normal, seizures, not hungry, or very bad upset stomach or throwing up) High or low blood pressure (bad headache or dizziness, passing out, or change in eyesight) Kidney issues (unable to pass urine, change in how much urine is passed, blood in the urine, or a big weight gain) Skin (oozing, heat, swelling, redness, or pain), UTI and other infections  Chest pain or pressure Abnormal heartbeat Unexplained bleeding or bruising Abnormal burning, numbness, or tingling Muscle cramps, Yellowing of skin or eyes  Monitoring parameters Pregnancy test initially prior to treatment and 8-10 days later then as needed) CBC weekly for first month then twice monthly for next 2 months, then monthly) Monitor Renal and liver functions Signs of organ rejection  Contraindications, Warnings, & Precautions   *This is a REMS drug and an FDA-approved patient medication guide will be printed with each dispensation Black Box Warning: Infections  Black Box Warning: Lymphoproliferative disorders - risk of development of lymphoma and skin malignancy is increased Black Box Warning: Use during pregnancy is associated with increased risks of first trimester pregnancy loss and congenital malformations.  Black Box Warning: Females of reproductive potential should use contraception during treatment and for 6 weeks after therapy is discontinued Is patient using an effective method of contraception? Not Applicable If yes, method of contraception: patient is male CNS depression New or reactivated  viral infections Neutropenia Male patients and/or their  male partners should use effective contraception during treatment of the male patient and for at least 3 months after last dose. Breastfeeding is not recommended during therapy and for 6 weeks after last dose  Drug/Food Interactions   Medication list reviewed in Epic. The patient was instructed to inform the care team before taking any new medications or supplements. No interactions noted that clinic is not already monitoring.  Do not take Echinacea while on this medication Check with your doctor before getting any vaccinations (live or inactivated)  Storage, Handling Precautions, & Disposal   Store at room temperature Keep away from children and pets This drug is considered hazardous and should be handled as little as possible.  Wash hands before and after touching pills. If someone else helps with medication administration, they should wear gloves.   Current Medications (including OTC/herbals), Comorbidities and Allergies   Current Medications[1]  Allergies[2]  Problem List[3]  Medication list has been reviewed and updated in Epic: Yes  Allergies have been reviewed and updated in Epic: Yes  Appropriateness of Therapy   Acute infections noted within Epic:  No active infections Patient reported infection: None  Is the medication and dose appropriate considering the patients diagnosis, treatment, and disease journey, comorbidities, medical history, current medications, allergies, therapeutic goals, self-administration ability, and access barriers? Yes  Prescription has been clinically reviewed: Yes   Baseline Quality of Life Assessment    How many days over the past month did your transplant  keep you from your normal activities? For example, brushing your teeth or getting up in the morning. 0  Financial Information   Medication Assistance provided: None Required  Anticipated copay of $17.78/90ds reviewed with patient. Verified delivery address.  Delivery Information    Scheduled delivery date: n/a  Expected start date: patient will stop cellcept generic once generic myfortic arrives and he starts it, clinic is aware of delivery date   Medication will be delivered via n/a to the n/a address in Epic [place].  This shipment will not require a signature.    Explained the services we provide at [facility] and that each month we would call to set up refills.  Stressed importance of returning phone calls so that we could ensure they receive their medications in time each month.  Informed patient that we should be setting up refills 7-10 days prior to when they will run out of medication.  A pharmacist will reach out to perform a clinical assessment periodically.  Informed patient that a welcome packet, containing information about our pharmacy and other support services, a Notice of Privacy Practices, and a drug information handout will be sent.    The patient or caregiver noted above participated in the development of this care plan and knows that they can request review of or adjustments to the care plan at any time.    Patient or caregiver verbalized understanding of the above information as well as how to contact the pharmacy at [phone] option 4 with any questions/concerns.  The pharmacy is open Monday through Friday 8:30am-4:30pm.  A pharmacist is available 24/7 via pager to answer any clinical questions they may have.  Patient Specific Needs   Does the patient have any physical, cognitive, or cultural barriers? No  Does the patient have adequate living arrangements? (i.e. the ability to store and take their medication appropriately) Yes  Did you identify any home environmental safety or security hazards? No  Patient prefers to have medications discussed with  Patient   Is the patient or caregiver able to read and understand education materials at a high school level or above? Yes  Patient's primary language is  English    Is the patient high risk? Yes, patient is taking a REMS drug. Medication is dispensed in compliance with REMS program  Does the patient have an additional or emergency contact listed in their chart? Yes  SOCIAL DETERMINANTS OF HEALTH   At the [facility], we have learned that life circumstances - like trouble affording food, housing, utilities, or transportation can affect the health of many of our patients.  That is why we wanted to ask: are you currently experiencing any life circumstances that are negatively impacting your health and/or quality of life? Patient declined to answer  Social Drivers of Health   Food Insecurity: No Food Insecurity (03/01/2024)   Hunger Vital Sign    Worried About Running Out of Food in the Last Year: Never true    Ran Out of Food in the Last Year: Never true  Tobacco Use: Medium Risk (05/15/2024)   Patient History    Smoking Tobacco Use: Former    Smokeless Tobacco Use: Never    Passive Exposure: Not on file  Transportation Needs: No Transportation Needs (03/01/2024)   PRAPARE - Administrator, Civil Service (Medical): No    Lack of Transportation (Non-Medical): No  Alcohol Use: Not on file  Housing: Low Risk (03/01/2024)   Housing    Within the past 12 months, have you ever stayed: outside, in a car, in a tent, in an overnight shelter, or temporarily in someone else's home (i.e. couch-surfing)?: No    Are you worried about losing your housing?: No  Physical Activity: Not on file  Utilities: Low Risk (03/01/2024)   Utilities    Within the past 12 months, have you been unable to get utilities (heat, electricity) when it was really needed?: No  Stress: No Stress Concern Present (03/04/2023)   Received from [facility]ational [facility] - Occupational Stress Questionnaire    Feeling of Stress : Not at all  Interpersonal Safety: Not At Risk (05/14/2024)   Interpersonal Safety    Unsafe Where You  Currently Live: No    Physically Hurt by Anyone: No    Abused by Anyone: No  Substance Use: Not on file (04/01/2023)  Intimate Partner Violence: Not At Risk (02/12/2024)   Humiliation, Afraid, Rape, and Kick questionnaire    Fear of Current or Ex-Partner: No    Emotionally Abused: No    Physically Abused: No    Sexually Abused: No  Social Connections: Not on file  Financial Resource Strain: Low Risk (03/01/2024)   Overall Financial Resource Strain (CARDIA)    Difficulty of Paying Living Expenses: Not very hard  Health Literacy: Not on file  Internet Connectivity: Internet connectivity concern unknown (03/01/2024)   Internet Connectivity    Do you have access to internet services: Yes    How do you connect to the internet: Not on file    Is your internet connection strong enough for you to watch video on your device without major problems?: Not on file    Do you have enough data to get through the month?: Not on file    Does at least one of the devices have a camera that you can use for video chat?: Not on file    Would  you be willing to receive help with any of the needs that you have identified today? Not applicable    Ronal FORBES Reusing, PharmD [facility] Specialty Pharmacist       [1] Current Outpatient Medications  Medication Sig Dispense Refill   acetaminophen  (TYLENOL ) 500 MG tablet Take 1-2 tablets (500-1,000 mg total) by mouth every eight (8) hours as needed for pain. 100 tablet 5   albuterol HFA 90 mcg/actuation inhaler Inhale 2 puffs every six (6) hours as needed for wheezing or shortness of breath. 18 g 0   aspirin 81 MG chewable tablet Chew 1 tablet (81 mg total) daily. 30 tablet 2   atorvastatin  (LIPITOR) 20 MG tablet Take 1 tablet (20 mg total) by mouth daily. 30 tablet 11   blood sugar diagnostic (GLUCOSE BLOOD) Strp Ok to sub any brand preferred by insurance/patient to match with meter, use 3x/day, dx E11.65 300 strip 3    blood-glucose meter kit Use as instructed 1 each 1   clindamycin (CLEOCIN T) 1 % lotion Apply topically two (2) times a day. 60 mL 0   DEXCOM G7 RECEIVER Misc Use as directed Type 2 Diabetes uncontrolled (E11.65) 1 each 0   DEXCOM G7 SENSOR Devi Apply Dexcom G7 sensor every 10 days. Type 2 Diabetes uncontrolled (E11.65) 9 each 3   EPINEPHrine  (EPIPEN ) 0.3 mg/0.3 mL injection Inject 0.3 mL (0.3 mg total) into the muscle once as needed for anaphylaxis for up to 1 dose. 2 each 0   ergocalciferol (VITAMIN D2) 1,250 mcg (50,000 unit) capsule Take 1 capsule (1,250 mcg total) by mouth once a week for 12 doses. (Patient taking differently: Take 1 capsule (1,250 mcg total) by mouth once a week. Taking on Tuesdays) 12 capsule 0   famotidine  (PEPCID ) 20 MG tablet Take 1 tablet (20 mg total) by mouth two (2) times a day. 180 tablet 1   gabapentin  (NEURONTIN ) 300 MG capsule Take 1 capsule (300 mg total) by mouth nightly. 90 capsule 3   insulin  glargine (BASAGLAR , LANTUS ) 100 unit/mL (3 mL) injection pen Inject 0.22 mL (22 Units total) under the skin nightly. 15 mL 2   insulin  lispro (HUMALOG) 100 unit/mL injection pen Inject 16-18 Units under the skin Three (3) times a day before meals. Taking 16-18 units with meals and use correctional insulin  (1 unit for every 50 mg/dL over 849 mg/dL) with meals. Max 48 units/day. 15 mL 3   insulin  syringe-needle U-100 1 mL 29 gauge x 1/2 (12 mm) Syrg Use with insulin  each. 100 each 0   lancets Misc Dispense 100 lancets, ok to sub any brand preferred by insurance/patient to match lancing device, use 4x/day, dx E11.65 100 each 12   lancets Misc Use to check blood sugar as directed with insulin  3 times a day & for symptoms of high or low blood sugar. 100 each 0   metFORMIN (GLUCOPHAGE-XR) 500 MG 24 hr tablet Take 1 tablet (500 mg total) by mouth two (2) times a day. 60 tablet 11   mycophenolate (MYFORTIC) 360 MG TbEC Take 1 tablet (360 mg total) by mouth three  (3) times a day. 90 tablet 11   pen needle, diabetic (BD ULTRA-FINE SHORT PEN NEEDLE) 31 gauge x 5/16 (8 mm) Ndle USE AS DIRECTED FOR  INSULIN   ADMINISTRATION 100 each 6   pen needle, diabetic 29 gauge x 1/2 (12 mm) Ndle Use with each insulin  use. 90 each 11   pen needle, diabetic 32 gauge x  5/32 (4 mm) Ndle Use with insulin  up to 4 times/day as needed. 100 each 4   predniSONE (DELTASONE) 10 MG tablet Take 6 tablets (60 mg total) by mouth daily for 2 days, THEN 4 tablets (40 mg total) daily for 2 days, THEN 2 tablets (20 mg total) daily for 2 days, THEN 1 tablet (10 mg total) daily. 54 tablet 0   QUEtiapine 150 mg Tab Take 1 tablet (150 mg) by mouth nightly. 30 tablet 0   sertraline  (ZOLOFT ) 100 MG tablet Take 1 tablet (100 mg total) by mouth before bedtime. 30 tablet 0   sodium zirconium cyclosilicate (LOKELMA) 10 gram PwPk packet Take 1 packet (10 g total) by mouth daily. Mix as directed on package. (Patient taking differently: Take 1 packet (10 g total) by mouth daily. Mix as directed on package. Taking 3x weekly) 30 packet 2   sulfamethoxazole-trimethoprim (BACTRIM) 400-80 mg per tablet Take 1 tablet (80 mg of trimethoprim total) by mouth every Monday, Wednesday, and Friday. 12 tablet 5   tacrolimus  (PROGRAF ) 1 MG capsule Take 4 capsules (4 mg total) by mouth daily AND 4 capsules (4 mg total) nightly. 210 capsule 11   traZODone  (DESYREL ) 100 MG tablet Take 1 tablet (100 mg total) by mouth daily as needed for sleep. 30 tablet 0   valGANciclovir (VALCYTE) 450 mg tablet Take 1 tablet (450 mg total) by mouth daily. 90 tablet 0   No current facility-administered medications for this visit.  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

## 2024-06-14 NOTE — Progress Notes (Addendum)
 [facility] Clinical Assessment & Refill Coordination Note    Darrell Walls, DOB: [birth year]  Phone: [phone] (home)     All above HIPAA information was verified with patient.     Was a nurse, learning disability used for this call? 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    Medication list has been reviewed and updated in Epic: Yes  No high priority alerts noted on med rec today    Allergies[2]    Changes to allergies: No    Allergies have been reviewed and updated in Epic: Yes    SPECIALTY MEDICATION ADHERENCE     Tacrolimus  1mg   : 18 days of medicine on hand   Mycophenolate  250mg   : 25 days of medicine on hand will be replaced by myco 360mg   Mycophenolate  360mg   : 0 days of medicine on hand will replace myco 250mg   Prednisone  10mg   : 15 days of medicine on hand     Specialty medication is an injection or given on a cycle: No    Medication Adherence    Patient reported X missed doses in the last month: 0  Specialty Medication: tacrolimus  1mg   Patient is on additional specialty medications: Yes  Additional Specialty Medications: Mycophenolate  360mg   Patient Reported Additional Medication X Missed Doses in the Last Month: 0  Patient is on more than two specialty medications: Yes  Specialty Medication: mycophenolate  250mg   Patient Reported Additional Medication X Missed Doses in the Last Month: 0  Specialty Medication: prednisone  10mg   Patient Reported Additional Medication X Missed Doses in the Last Month: 0          Specialty medication(s) dose(s) confirmed: Patient reports changes to the regimen as follows: switch from myco 250 to myco 360, pt aware not to use together     Are there any concerns with adherence? No    Adherence counseling provided? Not needed    CLINICAL MANAGEMENT AND INTERVENTION      Clinical Benefit Assessment:    Do you feel the medicine is effective or helping your condition? Yes    Clinical Benefit counseling provided? Not needed    Adverse Effects Assessment:    Are you experiencing any side effects? No    Are you experiencing difficulty administering your medicine? No    Quality of Life Assessment:    Quality of Life    Rheumatology  Oncology  Dermatology  Cystic Fibrosis          How many days over the past month did your transplant  keep you from your normal activities? For example, brushing your teeth or getting up in the morning. 0    Have you discussed this with your provider? Not needed    Acute Infection Status:    Acute infections noted within Epic:  No active infections    Patient reported infection: None    Therapy Appropriateness:    Is the medication and dose appropriate considering the patient???s diagnosis, treatment, and disease journey, comorbidities, medical history, current medications, allergies, therapeutic goals, self-administration ability, and access barriers? Yes, therapy is appropriate and should be continued     Clinical Intervention:    Was an intervention completed as part of this clinical assessment? No  DISEASE/MEDICATION-SPECIFIC INFORMATION      N/A    Solid Organ Transplant: Not Applicable    PATIENT SPECIFIC NEEDS     Does the patient have any physical, cognitive, or cultural barriers? No    Is the patient high risk? Yes, patient is taking a REMS drug. Medication is dispensed in compliance with REMS program    Does the patient require physician intervention or other additional services (i.e., nutrition, smoking cessation, social work)? No    Does the patient have an additional or emergency contact listed in their chart? Yes    SOCIAL DETERMINANTS OF HEALTH     At the [facility], we have learned that life circumstances - like trouble affording food, housing, utilities, or transportation can affect the health of many of our patients.   That is why we wanted to ask: are you currently experiencing any life circumstances that are negatively impacting your health and/or quality of life? Patient declined to answer    Social Drivers of Health     Food Insecurity: No Food Insecurity (03/01/2024)    Hunger Vital Sign     Worried About Running Out of Food in the Last Year: Never true     Ran Out of Food in the Last Year: Never true   Tobacco Use: Medium Risk (05/15/2024)    Patient History     Smoking Tobacco Use: Former     Smokeless Tobacco Use: Never     Passive Exposure: Not on file   Transportation Needs: No Transportation Needs (03/01/2024)    PRAPARE - Therapist, Art (Medical): No     Lack of Transportation (Non-Medical): No   Alcohol Use: Not on file   Housing: Low Risk (03/01/2024)    Housing     Within the past 12 months, have you ever stayed: outside, in a car, in a tent, in an overnight shelter, or temporarily in someone else's home (i.e. couch-surfing)?: No     Are you worried about losing your housing?: No   Physical Activity: Not on file   Utilities: Low Risk (03/01/2024)    Utilities     Within the past 12 months, have you been unable to get utilities (heat, electricity) when it was really needed?: No   Stress: No Stress Concern Present (03/04/2023)    Received from [facility] - Occupational Stress Questionnaire     Feeling of Stress : Not at all   Interpersonal Safety: Not At Risk (05/14/2024)    Interpersonal Safety     Unsafe Where You Currently Live: No     Physically Hurt by Anyone: No     Abused by Anyone: No   Substance Use: Not on file (04/01/2023)   Intimate Partner Violence: Not At Risk (02/12/2024)    Humiliation, Afraid, Rape, and Kick questionnaire     Fear of Current or Ex-Partner: No     Emotionally Abused: No     Physically Abused: No     Sexually Abused: No   Social Connections: Not on file   Financial Resource Strain: Low Risk (03/01/2024)    Overall Financial Resource Strain (CARDIA)     Difficulty of Paying Living Expenses: Not very hard   Health Literacy: Not on file   Internet Connectivity: Internet connectivity concern unknown (03/01/2024)    Internet Connectivity     Do you have access to internet services: Yes     How  do you connect to the internet: Not on file     Is your internet connection strong enough for you to watch video on your device without major problems?: Not on file     Do you have enough data to get through the month?: Not on file     Does at least one of the devices have a camera that you can use for video chat?: Not on file       Would you be willing to receive help with any of the needs that you have identified today? 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 They are aware and have authorized the pharmacy to charge the credit card on file.    Delivery Scheduled: Yes, Expected medication delivery date: 06/16/2024.  However, Rx request for refills was sent to the provider as there are none remaining.     Medication will be delivered via Next Day Courier to the confirmed prescription address in [facility].    The patient will receive a drug information handout for each medication shipped and additional FDA Medication Guides as required.  Verified that patient has previously received a Conservation Officer, Historic Buildings and a Surveyor, Mining.    The patient or caregiver noted above participated in the development of this care plan and knows that they can request review of or adjustments to the care plan at any time.      All of the patient's questions and concerns have been addressed.    Ronal FORBES Reusing, PharmD   [facility] Specialty Pharmacist         [1]   Current Outpatient Medications   Medication Sig Dispense Refill    acetaminophen  (TYLENOL ) 500 MG tablet Take 1-2 tablets (500-1,000 mg total) by mouth every eight (8) hours as needed for pain. 100 tablet 5    albuterol  HFA 90 mcg/actuation inhaler Inhale 2 puffs every six (6) hours as needed for wheezing or shortness of breath. 18 g 0    aspirin  81 MG chewable tablet Chew 1 tablet (81 mg total) daily. 30 tablet 2    atorvastatin  (LIPITOR ) 20 MG tablet Take 1 tablet (20 mg total) by mouth daily. 30 tablet 11    blood sugar diagnostic (GLUCOSE BLOOD) Strp Ok to sub any brand preferred by insurance/patient to match with meter, use 3x/day, dx E11.65 300 strip 3    blood-glucose meter kit Use as instructed 1 each 1    clindamycin  (CLEOCIN  T) 1 % lotion Apply topically two (2) times a day. 60 mL 0    DEXCOM G7 RECEIVER Misc Use as directed Type 2 Diabetes uncontrolled (E11.65) 1 each 0    DEXCOM G7 SENSOR Devi Apply Dexcom G7 sensor every 10 days. Type 2 Diabetes uncontrolled (E11.65) 9 each 3    EPINEPHrine  (EPIPEN ) 0.3 mg/0.3 mL injection Inject 0.3 mL (0.3 mg total)  into the muscle once as needed for anaphylaxis for up to 1 dose. 2 each 0    ergocalciferol  (VITAMIN D2) 1,250 mcg (50,000 unit) capsule Take 1 capsule (1,250 mcg total) by mouth once a week for 12 doses. (Patient taking differently: Take 1 capsule (1,250 mcg total) by mouth once a week. Taking on Tuesdays) 12 capsule 0    famotidine  (PEPCID ) 20 MG tablet Take 1 tablet (20 mg total) by mouth two (2) times a day. 180 tablet 1    gabapentin  (NEURONTIN ) 300 MG capsule Take 1 capsule (300 mg total) by mouth nightly. 90 capsule 3    insulin  glargine (BASAGLAR , LANTUS ) 100 unit/mL (3 mL) injection pen Inject 0.22 mL (22 Units total) under the skin nightly. 15 mL 2    insulin  lispro (HUMALOG ) 100 unit/mL injection pen Inject 16-18 Units under the skin Three (3) times a day before meals. Taking 16-18 units with meals and use correctional insulin  (1 unit for every 50 mg/dL over 849 mg/dL) with meals. Max 48 units/day. 15 mL 3    insulin  syringe-needle U-100 1 mL 29 gauge x 1/2 (12 mm) Syrg Use with insulin  each. 100 each 0    lancets Misc Dispense 100 lancets, ok to sub any brand preferred by insurance/patient to match lancing device, use 4x/day, dx E11.65 100 each 12    lancets Misc Use to check blood sugar as directed with insulin  3 times a day & for symptoms of high or low blood sugar. 100 each 0    metFORMIN  (GLUCOPHAGE -XR) 500 MG 24 hr tablet Take 1 tablet (500 mg total) by mouth two (2) times a day. 60 tablet 11    mycophenolate  (MYFORTIC ) 360 MG TbEC Take 1 tablet (360 mg total) by mouth three (3) times a day. 90 tablet 11    pen needle, diabetic (BD ULTRA-FINE SHORT PEN NEEDLE) 31 gauge x 5/16 (8 mm) Ndle USE AS DIRECTED FOR  INSULIN   ADMINISTRATION 100 each 6    pen needle, diabetic 29 gauge x 1/2 (12 mm) Ndle Use with each insulin  use. 90 each 11    pen needle, diabetic 32 gauge x 5/32 (4 mm) Ndle Use with insulin  up to 4 times/day as needed. 100 each 4    predniSONE  (DELTASONE ) 10 MG tablet Take 6 tablets (60 mg total) by mouth daily for 2 days, THEN 4 tablets (40 mg total) daily for 2 days, THEN 2 tablets (20 mg total) daily for 2 days, THEN 1 tablet (10 mg total) daily. 54 tablet 0    QUEtiapine  150 mg Tab Take 1 tablet (150 mg) by mouth nightly. 30 tablet 0    sertraline  (ZOLOFT ) 100 MG tablet Take 1 tablet (100 mg total) by mouth before bedtime. 30 tablet 0    sodium zirconium cyclosilicate  (LOKELMA ) 10 gram PwPk packet Take 1 packet (10 g total) by mouth daily. Mix as directed on package. (Patient taking differently: Take 1 packet (10 g total) by mouth daily. Mix as directed on package.  Taking 3x weekly) 30 packet 2    sulfamethoxazole -trimethoprim  (BACTRIM ) 400-80 mg per tablet Take 1 tablet (80 mg of trimethoprim  total) by mouth every Monday, Wednesday, and Friday. 12 tablet 5    tacrolimus  (PROGRAF ) 1 MG capsule Take 4 capsules (4 mg total) by mouth daily AND 4 capsules (4 mg total) nightly. 210 capsule 11    traZODone  (DESYREL ) 100 MG tablet Take 1 tablet (100 mg total) by mouth daily as needed for sleep. 30 tablet 0

## 2024-06-16 NOTE — Telephone Encounter (Signed)
 PCS Form sent to provider via [employer]

## 2024-06-16 NOTE — Telephone Encounter (Signed)
 Called patient to check in after missed appointment today.  Left detailed message checking in and needing to reschedule appt.  Call back number left as well.

## 2024-06-17 ENCOUNTER — Other Ambulatory Visit
Admission: RE | Admit: 2024-06-17 | Discharge: 2024-06-17 | Disposition: A | Discharge status: Home / Self Care | Source: Ambulatory Visit | Attending: Nephrology | Admitting: Nephrology

## 2024-06-17 DIAGNOSIS — Z794 Long term (current) use of insulin: Secondary | ICD-10-CM | POA: Diagnosis not present

## 2024-06-17 DIAGNOSIS — E1129 Type 2 diabetes mellitus with other diabetic kidney complication: Secondary | ICD-10-CM | POA: Diagnosis not present

## 2024-06-17 DIAGNOSIS — N390 Urinary tract infection, site not specified: Secondary | ICD-10-CM | POA: Diagnosis not present

## 2024-06-17 DIAGNOSIS — Z789 Other specified health status: Secondary | ICD-10-CM | POA: Diagnosis not present

## 2024-06-17 DIAGNOSIS — D631 Anemia in chronic kidney disease: Secondary | ICD-10-CM | POA: Diagnosis not present

## 2024-06-17 DIAGNOSIS — Z114 Encounter for screening for human immunodeficiency virus [HIV]: Secondary | ICD-10-CM | POA: Diagnosis not present

## 2024-06-17 DIAGNOSIS — Z79899 Other long term (current) drug therapy: Secondary | ICD-10-CM | POA: Diagnosis not present

## 2024-06-17 DIAGNOSIS — E559 Vitamin D deficiency, unspecified: Secondary | ICD-10-CM | POA: Insufficient documentation

## 2024-06-17 DIAGNOSIS — D899 Disorder involving the immune mechanism, unspecified: Secondary | ICD-10-CM | POA: Diagnosis present

## 2024-06-17 LAB — BASIC METABOLIC PANEL WITH GFR
Anion gap: 13 (ref 5–15)
BUN: 35 mg/dL — ABNORMAL HIGH (ref 6–20)
CO2: 22 mmol/L (ref 22–32)
Calcium: 10.3 mg/dL (ref 8.9–10.3)
Chloride: 98 mmol/L (ref 98–111)
Creatinine, Ser: 2.45 mg/dL — ABNORMAL HIGH (ref 0.61–1.24)
GFR, Estimated: 35 mL/min — ABNORMAL LOW
Glucose, Bld: 149 mg/dL — ABNORMAL HIGH (ref 70–99)
Potassium: 4.5 mmol/L (ref 3.5–5.1)
Sodium: 133 mmol/L — ABNORMAL LOW (ref 135–145)

## 2024-06-17 LAB — CBC WITH DIFFERENTIAL/PLATELET
Abs Immature Granulocytes: 0.26 K/uL — ABNORMAL HIGH (ref 0.00–0.07)
Basophils Absolute: 0 K/uL (ref 0.0–0.1)
Basophils Relative: 0 %
Eosinophils Absolute: 0 K/uL (ref 0.0–0.5)
Eosinophils Relative: 0 %
HCT: 40.2 % (ref 39.0–52.0)
Hemoglobin: 12.7 g/dL — ABNORMAL LOW (ref 13.0–17.0)
Immature Granulocytes: 11 %
Lymphocytes Relative: 5 %
Lymphs Abs: 0.1 K/uL — ABNORMAL LOW (ref 0.7–4.0)
MCH: 28.7 pg (ref 26.0–34.0)
MCHC: 31.6 g/dL (ref 30.0–36.0)
MCV: 91 fL (ref 80.0–100.0)
Monocytes Absolute: 0.2 K/uL (ref 0.1–1.0)
Monocytes Relative: 7 %
Neutro Abs: 1.8 K/uL (ref 1.7–7.7)
Neutrophils Relative %: 77 %
Platelets: 205 K/uL (ref 150–400)
RBC: 4.42 MIL/uL (ref 4.22–5.81)
RDW: 14.8 % (ref 11.5–15.5)
Smear Review: NORMAL
WBC: 2.3 K/uL — ABNORMAL LOW (ref 4.0–10.5)
nRBC: 0 % (ref 0.0–0.2)

## 2024-06-17 LAB — PHOSPHORUS: Phosphorus: 3.2 mg/dL (ref 2.5–4.6)

## 2024-06-17 LAB — MAGNESIUM: Magnesium: 2 mg/dL (ref 1.7–2.4)

## 2024-06-19 LAB — TACROLIMUS LEVEL: Tacrolimus (FK506) - LabCorp: 11 ng/mL (ref 5.0–20.0)

## 2024-06-28 ENCOUNTER — Other Ambulatory Visit
Admission: RE | Admit: 2024-06-28 | Discharge: 2024-06-28 | Disposition: A | Source: Home / Self Care | Attending: Nephrology | Admitting: Nephrology

## 2024-06-28 LAB — CBC WITH DIFFERENTIAL/PLATELET
Abs Immature Granulocytes: 0.21 10*3/uL — ABNORMAL HIGH (ref 0.00–0.07)
Basophils Absolute: 0 10*3/uL (ref 0.0–0.1)
Basophils Relative: 1 %
Eosinophils Absolute: 0.1 10*3/uL (ref 0.0–0.5)
Eosinophils Relative: 3 %
HCT: 40.7 % (ref 39.0–52.0)
Hemoglobin: 12.7 g/dL — ABNORMAL LOW (ref 13.0–17.0)
Immature Granulocytes: 14 %
Lymphocytes Relative: 11 %
Lymphs Abs: 0.2 10*3/uL — ABNORMAL LOW (ref 0.7–4.0)
MCH: 28.5 pg (ref 26.0–34.0)
MCHC: 31.2 g/dL (ref 30.0–36.0)
MCV: 91.5 fL (ref 80.0–100.0)
Monocytes Absolute: 0.1 10*3/uL (ref 0.1–1.0)
Monocytes Relative: 8 %
Neutro Abs: 1 10*3/uL — ABNORMAL LOW (ref 1.7–7.7)
Neutrophils Relative %: 63 %
Platelets: 262 10*3/uL (ref 150–400)
RBC: 4.45 MIL/uL (ref 4.22–5.81)
RDW: 15.2 % (ref 11.5–15.5)
Smear Review: NORMAL
WBC: 1.6 10*3/uL — ABNORMAL LOW (ref 4.0–10.5)
nRBC: 0 % (ref 0.0–0.2)

## 2024-06-28 LAB — BASIC METABOLIC PANEL WITH GFR
Anion gap: 10 (ref 5–15)
BUN: 42 mg/dL — ABNORMAL HIGH (ref 6–20)
CO2: 21 mmol/L — ABNORMAL LOW (ref 22–32)
Calcium: 9.8 mg/dL (ref 8.9–10.3)
Chloride: 104 mmol/L (ref 98–111)
Creatinine, Ser: 1.54 mg/dL — ABNORMAL HIGH (ref 0.61–1.24)
GFR, Estimated: >60 mL/min
Glucose, Bld: 402 mg/dL — ABNORMAL HIGH (ref 70–99)
Potassium: 5.2 mmol/L — ABNORMAL HIGH (ref 3.5–5.1)
Sodium: 135 mmol/L (ref 135–145)

## 2024-06-28 LAB — PHOSPHORUS: Phosphorus: 3.5 mg/dL (ref 2.5–4.6)

## 2024-06-28 LAB — MAGNESIUM: Magnesium: 2 mg/dL (ref 1.7–2.4)

## 2024-06-30 LAB — TACROLIMUS LEVEL: Tacrolimus (FK506) - LabCorp: 6.2 ng/mL (ref 5.0–20.0)
